# Patient Record
Sex: Female | Born: 1955 | Race: White | Hispanic: No | Marital: Married | Smoking: Current every day smoker
Health system: Southern US, Community
[De-identification: ages and names within clinical notes are randomized; demographics above are authoritative.]

## PROBLEM LIST (undated history)

## (undated) DIAGNOSIS — J449 Chronic obstructive pulmonary disease, unspecified: Secondary | ICD-10-CM

## (undated) DIAGNOSIS — E7800 Pure hypercholesterolemia, unspecified: Secondary | ICD-10-CM

## (undated) DIAGNOSIS — I10 Essential (primary) hypertension: Secondary | ICD-10-CM

## (undated) DIAGNOSIS — J441 Chronic obstructive pulmonary disease with (acute) exacerbation: Secondary | ICD-10-CM

## (undated) HISTORY — DX: Chronic obstructive pulmonary disease with (acute) exacerbation: J44.1

## (undated) HISTORY — PX: APPENDECTOMY: SHX54

## (undated) HISTORY — DX: Essential (primary) hypertension: I10

---

## 1997-12-10 ENCOUNTER — Other Ambulatory Visit: Admission: RE | Admit: 1997-12-10 | Discharge: 1997-12-10 | Payer: Self-pay | Admitting: Obstetrics and Gynecology

## 2003-04-08 ENCOUNTER — Other Ambulatory Visit: Admission: RE | Admit: 2003-04-08 | Discharge: 2003-04-08 | Payer: Self-pay | Admitting: Obstetrics and Gynecology

## 2006-07-31 ENCOUNTER — Emergency Department (HOSPITAL_COMMUNITY): Admission: EM | Admit: 2006-07-31 | Discharge: 2006-07-31 | Payer: Self-pay | Admitting: Emergency Medicine

## 2008-06-09 ENCOUNTER — Emergency Department (HOSPITAL_COMMUNITY): Admission: EM | Admit: 2008-06-09 | Discharge: 2008-06-09 | Payer: Self-pay | Admitting: Emergency Medicine

## 2009-09-10 ENCOUNTER — Encounter: Admission: RE | Admit: 2009-09-10 | Discharge: 2009-09-10 | Payer: Self-pay | Admitting: Family Medicine

## 2010-04-12 LAB — DIFFERENTIAL
Basophils Absolute: 0 10*3/uL (ref 0.0–0.1)
Basophils Relative: 0 % (ref 0–1)
Eosinophils Absolute: 0 10*3/uL (ref 0.0–0.7)
Monocytes Absolute: 0.6 10*3/uL (ref 0.1–1.0)
Monocytes Relative: 5 % (ref 3–12)
Neutro Abs: 11.1 10*3/uL — ABNORMAL HIGH (ref 1.7–7.7)
Neutrophils Relative %: 89 % — ABNORMAL HIGH (ref 43–77)

## 2010-04-12 LAB — BASIC METABOLIC PANEL
CO2: 27 mEq/L (ref 19–32)
Calcium: 9.4 mg/dL (ref 8.4–10.5)
Chloride: 102 mEq/L (ref 96–112)
Creatinine, Ser: 0.6 mg/dL (ref 0.4–1.2)
Glucose, Bld: 104 mg/dL — ABNORMAL HIGH (ref 70–99)

## 2010-04-12 LAB — CBC
MCHC: 34.1 g/dL (ref 30.0–36.0)
MCV: 93 fL (ref 78.0–100.0)
RDW: 12.7 % (ref 11.5–15.5)

## 2010-04-12 LAB — POCT CARDIAC MARKERS
CKMB, poc: <1 ng/mL — ABNORMAL LOW (ref 1.0–8.0)
Myoglobin, poc: 52.1 ng/mL (ref 12–200)
Troponin i, poc: <0.05 ng/mL (ref 0.00–0.09)

## 2010-09-30 ENCOUNTER — Other Ambulatory Visit: Payer: Self-pay | Admitting: Family Medicine

## 2010-09-30 DIAGNOSIS — Z1231 Encounter for screening mammogram for malignant neoplasm of breast: Secondary | ICD-10-CM

## 2010-10-13 ENCOUNTER — Ambulatory Visit
Admission: RE | Admit: 2010-10-13 | Discharge: 2010-10-13 | Disposition: A | Discharge status: Home / Self Care | Payer: BC Managed Care – PPO | Source: Ambulatory Visit | Attending: Family Medicine | Admitting: Family Medicine

## 2010-10-13 DIAGNOSIS — Z1231 Encounter for screening mammogram for malignant neoplasm of breast: Secondary | ICD-10-CM

## 2010-10-18 LAB — CBC
HCT: 46.4 — ABNORMAL HIGH
Hemoglobin: 15.9 — ABNORMAL HIGH
MCHC: 34.3
MCV: 91.4
RBC: 5.08
RDW: 13.3

## 2010-10-18 LAB — BASIC METABOLIC PANEL
CO2: 28
Calcium: 9.6
Chloride: 102
GFR calc Af Amer: >60
Glucose, Bld: 97
Sodium: 140

## 2010-10-18 LAB — DIFFERENTIAL
Basophils Absolute: 0
Basophils Relative: 0
Eosinophils Absolute: 0.1
Eosinophils Relative: 1
Monocytes Absolute: 0.6
Monocytes Relative: 6

## 2010-10-18 LAB — POCT CARDIAC MARKERS: Myoglobin, poc: 63.5

## 2011-07-04 ENCOUNTER — Telehealth: Payer: Self-pay | Admitting: *Deleted

## 2011-07-06 NOTE — Telephone Encounter (Signed)
error 

## 2012-05-25 ENCOUNTER — Observation Stay (HOSPITAL_COMMUNITY)
Admission: EM | Admit: 2012-05-25 | Discharge: 2012-05-26 | Disposition: A | Discharge status: Home / Self Care | Payer: Medicaid Other | Attending: Internal Medicine | Admitting: Internal Medicine

## 2012-05-25 ENCOUNTER — Emergency Department (HOSPITAL_COMMUNITY): Payer: Self-pay

## 2012-05-25 ENCOUNTER — Encounter (HOSPITAL_COMMUNITY): Payer: Self-pay | Admitting: Cardiology

## 2012-05-25 DIAGNOSIS — E7800 Pure hypercholesterolemia, unspecified: Secondary | ICD-10-CM | POA: Insufficient documentation

## 2012-05-25 DIAGNOSIS — J441 Chronic obstructive pulmonary disease with (acute) exacerbation: Secondary | ICD-10-CM

## 2012-05-25 DIAGNOSIS — F17200 Nicotine dependence, unspecified, uncomplicated: Secondary | ICD-10-CM | POA: Insufficient documentation

## 2012-05-25 DIAGNOSIS — J9611 Chronic respiratory failure with hypoxia: Secondary | ICD-10-CM | POA: Diagnosis present

## 2012-05-25 DIAGNOSIS — R0902 Hypoxemia: Secondary | ICD-10-CM

## 2012-05-25 DIAGNOSIS — Z720 Tobacco use: Secondary | ICD-10-CM | POA: Diagnosis present

## 2012-05-25 DIAGNOSIS — R0609 Other forms of dyspnea: Secondary | ICD-10-CM

## 2012-05-25 HISTORY — DX: Chronic obstructive pulmonary disease with (acute) exacerbation: J44.1

## 2012-05-25 HISTORY — DX: Chronic obstructive pulmonary disease, unspecified: J44.9

## 2012-05-25 HISTORY — DX: Pure hypercholesterolemia, unspecified: E78.00

## 2012-05-25 LAB — BASIC METABOLIC PANEL
CO2: 28 mEq/L (ref 19–32)
Chloride: 97 mEq/L (ref 96–112)
Creatinine, Ser: 0.57 mg/dL (ref 0.50–1.10)
Glucose, Bld: 97 mg/dL (ref 70–99)

## 2012-05-25 LAB — CBC
HCT: 45.4 % (ref 36.0–46.0)
Hemoglobin: 15.6 g/dL — ABNORMAL HIGH (ref 12.0–15.0)
MCH: 32.4 pg (ref 26.0–34.0)
MCV: 94.2 fL (ref 78.0–100.0)
Platelets: 355 10*3/uL (ref 150–400)
RBC: 4.82 MIL/uL (ref 3.87–5.11)
WBC: 11 10*3/uL — ABNORMAL HIGH (ref 4.0–10.5)

## 2012-05-25 LAB — POCT I-STAT TROPONIN I: Troponin i, poc: 0 ng/mL (ref 0.00–0.08)

## 2012-05-25 LAB — PRO B NATRIURETIC PEPTIDE: Pro B Natriuretic peptide (BNP): 97.4 pg/mL (ref 0–125)

## 2012-05-25 MED ORDER — ALBUTEROL SULFATE HFA 108 (90 BASE) MCG/ACT IN AERS
2.0000 | INHALATION_SPRAY | Freq: Four times a day (QID) | RESPIRATORY_TRACT | Status: DC
  Administered 2012-05-26: 2 via RESPIRATORY_TRACT
  Filled 2012-05-25 (×3): qty 6.7

## 2012-05-25 MED ORDER — METHYLPREDNISOLONE SODIUM SUCC 125 MG IJ SOLR: 80.0000 mg | Freq: Two times a day (BID) | INTRAMUSCULAR | Status: DC

## 2012-05-25 MED ORDER — ALBUTEROL SULFATE (5 MG/ML) 0.5% IN NEBU: 2.5000 mg | INHALATION_SOLUTION | RESPIRATORY_TRACT | Status: DC | PRN

## 2012-05-25 MED ORDER — ENOXAPARIN SODIUM 40 MG/0.4ML ~~LOC~~ SOLN
40.0000 mg | SUBCUTANEOUS | Status: DC
  Administered 2012-05-25: 40 mg via SUBCUTANEOUS
  Filled 2012-05-25 (×2): qty 0.4

## 2012-05-25 MED ORDER — PREDNISONE 20 MG PO TABS
60.0000 mg | ORAL_TABLET | Freq: Once | ORAL | Status: AC
  Administered 2012-05-25: 60 mg via ORAL
  Filled 2012-05-25: qty 3

## 2012-05-25 MED ORDER — SODIUM CHLORIDE 0.9 % IV SOLN: 250.0000 mL | INTRAVENOUS | Status: DC | PRN

## 2012-05-25 MED ORDER — SODIUM CHLORIDE 0.9 % IJ SOLN: 3.0000 mL | INTRAMUSCULAR | Status: DC | PRN

## 2012-05-25 MED ORDER — DOXYCYCLINE HYCLATE 100 MG PO TABS
100.0000 mg | ORAL_TABLET | Freq: Two times a day (BID) | ORAL | Status: DC
  Administered 2012-05-25 – 2012-05-26 (×2): 100 mg via ORAL
  Filled 2012-05-25 (×3): qty 1

## 2012-05-25 MED ORDER — ACETAMINOPHEN 650 MG RE SUPP: 650.0000 mg | Freq: Four times a day (QID) | RECTAL | Status: DC | PRN

## 2012-05-25 MED ORDER — SODIUM CHLORIDE 0.9 % IJ SOLN: 3.0000 mL | Freq: Two times a day (BID) | INTRAMUSCULAR | Status: DC

## 2012-05-25 MED ORDER — ALBUTEROL SULFATE (5 MG/ML) 0.5% IN NEBU
5.0000 mg | INHALATION_SOLUTION | Freq: Once | RESPIRATORY_TRACT | Status: AC
  Administered 2012-05-25: 5 mg via RESPIRATORY_TRACT
  Filled 2012-05-25: qty 1

## 2012-05-25 MED ORDER — ONDANSETRON HCL 4 MG/2ML IJ SOLN: 4.0000 mg | Freq: Three times a day (TID) | INTRAMUSCULAR | Status: AC | PRN

## 2012-05-25 MED ORDER — ASPIRIN 81 MG PO CHEW
81.0000 mg | CHEWABLE_TABLET | Freq: Every day | ORAL | Status: DC
  Administered 2012-05-25 – 2012-05-26 (×2): 81 mg via ORAL
  Filled 2012-05-25 (×2): qty 1

## 2012-05-25 MED ORDER — ACETAMINOPHEN 325 MG PO TABS: 650.0000 mg | ORAL_TABLET | Freq: Four times a day (QID) | ORAL | Status: DC | PRN

## 2012-05-25 MED ORDER — NICOTINE 21 MG/24HR TD PT24
21.0000 mg | MEDICATED_PATCH | Freq: Every day | TRANSDERMAL | Status: DC
  Administered 2012-05-25 – 2012-05-26 (×2): 21 mg via TRANSDERMAL
  Filled 2012-05-25 (×2): qty 1

## 2012-05-25 MED ORDER — IPRATROPIUM BROMIDE 0.02 % IN SOLN
0.5000 mg | Freq: Once | RESPIRATORY_TRACT | Status: AC
  Administered 2012-05-25: 0.5 mg via RESPIRATORY_TRACT
  Filled 2012-05-25: qty 2.5

## 2012-05-25 MED ORDER — PREDNISONE 50 MG PO TABS
50.0000 mg | ORAL_TABLET | Freq: Two times a day (BID) | ORAL | Status: DC
  Administered 2012-05-26: 50 mg via ORAL
  Filled 2012-05-25 (×3): qty 1

## 2012-05-25 MED ORDER — ALBUTEROL SULFATE (5 MG/ML) 0.5% IN NEBU
5.0000 mg | INHALATION_SOLUTION | RESPIRATORY_TRACT | Status: DC
  Administered 2012-05-25: 5 mg via RESPIRATORY_TRACT
  Filled 2012-05-25: qty 1

## 2012-05-25 NOTE — ED Notes (Signed)
Pt reports over the past 7-8 she has had some SOB and increased work of breathing. States that she has also been having some palpitations at night when she lays down. Also reports some chest tightness.

## 2012-05-25 NOTE — ED Notes (Signed)
Pt to XR

## 2012-05-25 NOTE — H&P (Signed)
[facility] History and Physical  Judith Scott ZOX:[id number] DOB: 07-Jan-1955 DOA: 05/25/2012  Referring physician:ED PCP: No PCP Per Patient  Specialists: none  Chief Complaint:  Chief Complaint  Patient presents with  . Shortness of Breath     HPI: Judith Scott is a 57 y.o. female with past medical history of COPD who presented to the emergency room with one week of worsening dyspnea cough with yellow sputum production. She has tried to use her nebulized albuterol and pain without relief. Denies any fevers or chills. Still smokes cigarettes. In the emergency room she received bronchodilators and prednisone but upon trial of ambulation she became tachypneic and hypoxic she was referred for admission.    Review of Systems: The patient denies anorexia, fever, weight loss,, vision loss, decreased hearing, hoarseness, chest pain, syncope, peripheral edema, balance deficits, hemoptysis, abdominal pain, melena, hematochezia, severe indigestion/heartburn, hematuria, incontinence, genital sores, muscle weakness, suspicious skin lesions, transient blindness, difficulty walking, depression, unusual weight change, abnormal bleeding, enlarged lymph nodes,   Past Medical History  Diagnosis Date  . COPD (chronic obstructive pulmonary disease)   . Hypercholesteremia    History reviewed. No pertinent past surgical history. Social History:  reports that she has been smoking Cigarettes.  She has a 40 pack-year smoking history. She does not have any smokeless tobacco history on file. She reports that she does not drink alcohol or use illicit drugs.  No Known Allergies  Family History  Problem Relation Age of Onset  . Cancer Mother   . Heart failure Father      Prior to Admission medications   Medication Sig Start Date End Date Taking? Authorizing Provider  albuterol (PROVENTIL) (5 MG/ML) 0.5% nebulizer solution Take 2.5 mg by nebulization daily as needed for wheezing or shortness of breath.    Yes Historical Provider, MD  aspirin (ASPIRIN ADULT LOW STRENGTH) 81 MG chewable tablet Chew 81 mg by mouth daily.   Yes Historical Provider, MD  Aspirin-Acetaminophen (GOODYS BODY PAIN PO) Take 1 packet by mouth 3 (three) times daily as needed (pain).   Yes Historical Provider, MD   Physical Exam: Filed Vitals:   05/25/12 1218 05/25/12 1225 05/25/12 1526 05/25/12 1629  BP:   132/57   Pulse:   77   Temp:      TempSrc:      Resp:   20   SpO2: 93% 92% 90% 88%     General:  Alert and oriented x3  Eyes: Pupils equal round react to light accommodation, extraocular movements intact  ENT: Clear pharynx no exudates  Neck: No jugular venous distention  Cardiovascular: Regular rate and rhythm without murmurs rubs or gallops  Respiratory: Fairly good air movement, few rhonchi, minimal wheezes  Abdomen: Soft nontender bowel sounds are present  Skin: Warm dry without rashes  Musculoskeletal: Intact  Psychiatric: Euthymic  Neurologic: Cranial nerves 2-12 intact, strength 5 out of 5 in all 4 extremities, sensation intact  Labs on Admission:  Basic Metabolic Panel:  Recent Labs Lab 05/25/12 1201  NA 138  K 4.1  CL 97  CO2 28  GLUCOSE 97  BUN 8  CREATININE 0.57  CALCIUM 9.6   Liver Function Tests: No results found for this basename: AST, ALT, ALKPHOS, BILITOT, PROT, ALBUMIN,  in the last 168 hours No results found for this basename: LIPASE, AMYLASE,  in the last 168 hours No results found for this basename: AMMONIA,  in the last 168 hours CBC:  Recent Labs Lab 05/25/12 1201  WBC  11.0*  HGB 15.6*  HCT 45.4  MCV 94.2  PLT 355   Cardiac Enzymes: No results found for this basename: CKTOTAL, CKMB, CKMBINDEX, TROPONINI,  in the last 168 hours  BNP (last 3 results)  Recent Labs  05/25/12 1200  PROBNP 97.4   CBG: No results found for this basename: GLUCAP,  in the last 168 hours  Radiological Exams on Admission: Dg Chest 2 View  05/25/2012   *RADIOLOGY  REPORT*  Clinical Data: Short of breath, anxiety  CHEST - 2 VIEW  Comparison: Prior chest x-rays 06/09/2008  Findings: Pulmonary hyperexpansion with flattening of the diaphragms and central airway thickening as can be seen with chronic bronchitis/COPD.  Prominence of the anterior aspect of the right inferior ribs is similar to prior.  No pneumothorax, pleural effusion, focal airspace consolidation or suspicious pulmonary nodule.  Cardiac and mediastinal contours are within normal limits. No acute osseous abnormality.  IMPRESSION: No acute cardiopulmonary disease.  COPD.   Original Report Authenticated By: Malachy Moan, M.D.      Assessment/Plan Principal Problem:   COPD exacerbation Active Problems:   Tobacco abuse   Hypoxia   1. COPD exacerbation-mild-associated with hypoxemia at ambulation. We'll place on observation in the hospital and start IV steroids, antibiotics, albuterol MDI. 2. Tobacco abuse I have counseled on importance of smoking cessation and placed patient on nicotine patch 3. Hypoxia from COPD exacerbation-probably patient has a significant degree of emphysema-she'll need reassessment of her oxygen needs prior to discharge    Code Status:  Full code (must indicate code status--if unknown or must be presumed, indicate so) Family Communication: Patient (indicate person spoken with, if applicable, with phone number if by telephone) Disposition Plan: Home (indicate anticipated LOS)    Matix Henshaw [facility] Pager [phone]  If 7PM-7AM, please contact night-coverage [email or web address] Password [facility] 05/25/2012, 5:52 PM

## 2012-05-25 NOTE — ED Notes (Signed)
Walked pt around nurses station O2 between 86%-89%. Pt said she did not feel any more short of breath then normal

## 2012-05-25 NOTE — ED Provider Notes (Signed)
History     CSN: [id number]  Arrival date & time 05/25/12  1146   First MD Initiated Contact with Patient 05/25/12 1201      Chief Complaint  Patient presents with  . Shortness of Breath    (Consider location/radiation/quality/duration/timing/severity/associated sxs/prior treatment) HPI Comments: Patient with history of COPD presents complaining of increasing dyspnea with exertion and shortness of breath, worse at night, for the past week. Patient also admits to palpitations at night. She has had these in the past. She has not had chest pain. She has had chest tightness with shortness of breath that resolved with albuterol. Patient used her last albuterol inhaler early this morning. She denies fever, upper respiratory tract infection symptoms. She has had a productive cough. No abdominal pain, urinary symptoms. No orthopnea or lower extremity edema. No history of blood clots, recent travel. No other treatments prior to arrival. Patient has been off of all of her chronic medications since she lost her insurance year ago.  Patient is a 57 y.o. female presenting with shortness of breath. The history is provided by the patient.  Shortness of Breath Associated symptoms: cough   Associated symptoms: no abdominal pain, no chest pain, no fever, no headaches, no rash, no sore throat and no vomiting     Past Medical History  Diagnosis Date  . COPD (chronic obstructive pulmonary disease)   . Hypercholesteremia     History reviewed. No pertinent past surgical history.  History reviewed. No pertinent family history.  History  Substance Use Topics  . Smoking status: Current Every Day Smoker    Types: Cigarettes  . Smokeless tobacco: Not on file  . Alcohol Use: No    OB History   Grav Para Term Preterm Abortions TAB SAB Ect Mult Living                  Review of Systems  Constitutional: Negative for fever.  HENT: Negative for sore throat and rhinorrhea.   Eyes: Negative for  redness.  Respiratory: Positive for cough, chest tightness and shortness of breath.   Cardiovascular: Negative for chest pain and leg swelling.  Gastrointestinal: Negative for nausea, vomiting, abdominal pain and diarrhea.  Genitourinary: Negative for dysuria.  Musculoskeletal: Negative for myalgias.  Skin: Negative for rash.  Neurological: Negative for headaches.    Allergies  Review of patient's allergies indicates no known allergies.  Home Medications   Current Outpatient Rx  Name  Route  Sig  Dispense  Refill  . albuterol (PROVENTIL) (5 MG/ML) 0.5% nebulizer solution   Nebulization   Take 2.5 mg by nebulization daily as needed for wheezing or shortness of breath.         Marland Kitchen aspirin (ASPIRIN ADULT LOW STRENGTH) 81 MG chewable tablet   Oral   Chew 81 mg by mouth daily.         . Aspirin-Acetaminophen (GOODYS BODY PAIN PO)   Oral   Take 1 packet by mouth 3 (three) times daily as needed (pain).           BP 129/69  Pulse 82  Temp(Src) 98.6 F (37 C) (Oral)  Resp 18  SpO2 94%  Physical Exam  Nursing note and vitals reviewed. Constitutional: She appears well-developed and well-nourished.  HENT:  Head: Normocephalic and atraumatic.  Eyes: Conjunctivae are normal. Right eye exhibits no discharge. Left eye exhibits no discharge.  Neck: Normal range of motion. Neck supple. No JVD present.  Cardiovascular: Normal rate, regular rhythm and normal heart  sounds.   Pulmonary/Chest: Effort normal and breath sounds normal. No respiratory distress. She has no wheezes. She has no rales.  Abdominal: Soft. There is no tenderness.  Musculoskeletal: She exhibits no edema and no tenderness.  Neurological: She is alert.  Skin: Skin is warm and dry.  Psychiatric: She has a normal mood and affect.    ED Course  Procedures (including critical care time)  Labs Reviewed  CBC - Abnormal; Notable for the following:    WBC 11.0 (*)    Hemoglobin 15.6 (*)    All other components  within normal limits  BASIC METABOLIC PANEL  PRO B NATRIURETIC PEPTIDE  POCT I-STAT TROPONIN I   Dg Chest 2 View  05/25/2012   *RADIOLOGY REPORT*  Clinical Data: Short of breath, anxiety  CHEST - 2 VIEW  Comparison: Prior chest x-rays 06/09/2008  Findings: Pulmonary hyperexpansion with flattening of the diaphragms and central airway thickening as can be seen with chronic bronchitis/COPD.  Prominence of the anterior aspect of the right inferior ribs is similar to prior.  No pneumothorax, pleural effusion, focal airspace consolidation or suspicious pulmonary nodule.  Cardiac and mediastinal contours are within normal limits. No acute osseous abnormality.  IMPRESSION: No acute cardiopulmonary disease.  COPD.   Original Report Authenticated By: Malachy Moan, M.D.     1. COPD exacerbation   2. Dyspnea on exertion     12:15 PM Patient seen and examined. Work-up initiated. Medications ordered.   Vital signs reviewed and are as follows: Filed Vitals:   05/25/12 1153  BP: 129/69  Pulse: 82  Temp: 98.6 F (37 C)  Resp: 18   Work-up complete and patient states subjective improvement after neb treatment.   However, upon ambulation, she becomes short of breath and hypoxic between 86-89% on room air.   Patient given option of admission for treatment or sign-out AMA with albuterol -- she agrees to be admitted for treatment.   5:28 PM Spoke with Dr. Lavera Guise of [facility] who will see.    MDM  COPD exacerbation, hypoxia. Admit.         Renne Crigler, PA-C 05/25/12 1728

## 2012-05-25 NOTE — ED Notes (Signed)
Per admitting MD pt does not need IV access.

## 2012-05-25 NOTE — Progress Notes (Signed)
EDCM asked to see patient by [facility] representative Felicia, regarding medication assistance.  As per patient, she had medical insurance a year ago, but no longer has insurance and is now having difficulty affording medications.  Patient entered into [facility] program by CM.  Explained to patient that she will have a three dollar copay for each one of her prescriptions, to get her prescriptions filled in seven days, and that she would not be eligible for this service again until May of 2015.  Patient verbalizes understanding and is grateful for the assistance.

## 2012-05-26 DIAGNOSIS — F17200 Nicotine dependence, unspecified, uncomplicated: Secondary | ICD-10-CM

## 2012-05-26 DIAGNOSIS — J441 Chronic obstructive pulmonary disease with (acute) exacerbation: Principal | ICD-10-CM

## 2012-05-26 DIAGNOSIS — R0902 Hypoxemia: Secondary | ICD-10-CM

## 2012-05-26 MED ORDER — ALBUTEROL SULFATE HFA 108 (90 BASE) MCG/ACT IN AERS: 2.0000 | INHALATION_SPRAY | RESPIRATORY_TRACT | Status: DC | PRN

## 2012-05-26 MED ORDER — DOXYCYCLINE HYCLATE 100 MG PO TABS: 100.0000 mg | ORAL_TABLET | Freq: Two times a day (BID) | ORAL | Status: DC

## 2012-05-26 MED ORDER — PREDNISONE 10 MG PO TABS: ORAL_TABLET | ORAL | Status: DC

## 2012-05-26 MED ORDER — TIOTROPIUM BROMIDE MONOHYDRATE 18 MCG IN CAPS
18.0000 ug | ORAL_CAPSULE | Freq: Every day | RESPIRATORY_TRACT | Status: DC
  Filled 2012-05-26: qty 5

## 2012-05-26 MED ORDER — ALBUTEROL SULFATE (5 MG/ML) 0.5% IN NEBU: 2.5000 mg | INHALATION_SOLUTION | Freq: Every day | RESPIRATORY_TRACT | Status: DC | PRN

## 2012-05-26 MED ORDER — TIOTROPIUM BROMIDE MONOHYDRATE 18 MCG IN CAPS: 18.0000 ug | ORAL_CAPSULE | Freq: Every day | RESPIRATORY_TRACT | Status: DC

## 2012-05-26 NOTE — Progress Notes (Signed)
Pt d/c to car via wheelchair.  Left with oxygen.  Denied any other needs at this time.

## 2012-05-26 NOTE — Progress Notes (Signed)
Reviewed discharge instructions and prescriptions with patient.  Pt denied any other needs at this time.  Upon checking oxygen saturations, after activity pt at 88%.  When at rest, patient 95%.  Denied any SOB.  Dr. Jerral Ralph informed.  Will come to assess pt.

## 2012-05-26 NOTE — Discharge Summary (Addendum)
PATIENT DETAILS Name: Judith Scott Age: 57 y.o. Sex: female Date of Birth: [birth year] MRN: [id number]. Admit Date: 05/25/2012 Admitting Physician: Lorane Gell, MD PCP:No PCP Per Patient  Recommendations for Outpatient Follow-up:  1. Counsel against Tobacco use 2. Optimize COPD medications  PRIMARY DISCHARGE DIAGNOSIS:  Principal Problem:   COPD exacerbation Active Problems:   Tobacco abuse   Hypoxia      PAST MEDICAL HISTORY: Past Medical History  Diagnosis Date  . COPD (chronic obstructive pulmonary disease)   . Hypercholesteremia     DISCHARGE MEDICATIONS:   Medication List    STOP taking these medications       GOODYS BODY PAIN PO      TAKE these medications       albuterol 108 (90 BASE) MCG/ACT inhaler  Commonly known as:  PROVENTIL HFA;VENTOLIN HFA  Inhale 2 puffs into the lungs every 4 (four) hours as needed for wheezing.     albuterol (5 MG/ML) 0.5% nebulizer solution  Commonly known as:  PROVENTIL  Take 0.5 mLs (2.5 mg total) by nebulization daily as needed for wheezing or shortness of breath.     ASPIRIN ADULT LOW STRENGTH 81 MG chewable tablet  Generic drug:  aspirin  Chew 81 mg by mouth daily.     doxycycline 100 MG tablet  Commonly known as:  VIBRA-TABS  Take 1 tablet (100 mg total) by mouth every 12 (twelve) hours.     predniSONE 10 MG tablet  Commonly known as:  DELTASONE  Take 4 tablets for 2 days, then,  Take 3 tablets for 2 days, then,  Take 2 tablets for 2 days, then,   Take 1 tablet for 1 day and then stop     tiotropium 18 MCG inhalation capsule  Commonly known as:  SPIRIVA  Place 1 capsule (18 mcg total) into inhaler and inhale daily.        ALLERGIES:  No Known Allergies  BRIEF HPI:  See H&P, Labs, Consult and Test reports for all details in brief, Judith Scott is a 57 y.o. female with past medical history of COPD who presented to the emergency room with one week of worsening dyspnea cough with yellow sputum production.  She has tried to use her nebulized albuterol and pain without relief. Denies any fevers or chills. Still smokes cigarettes. In the emergency room she received bronchodilators and prednisone but upon trial of ambulation she became tachypneic and hypoxic she was referred for admission.   CONSULTATIONS:   None  PERTINENT RADIOLOGIC STUDIES: Dg Chest 2 View  05/25/2012   *RADIOLOGY REPORT*  Clinical Data: Short of breath, anxiety  CHEST - 2 VIEW  Comparison: Prior chest x-rays 06/09/2008  Findings: Pulmonary hyperexpansion with flattening of the diaphragms and central airway thickening as can be seen with chronic bronchitis/COPD.  Prominence of the anterior aspect of the right inferior ribs is similar to prior.  No pneumothorax, pleural effusion, focal airspace consolidation or suspicious pulmonary nodule.  Cardiac and mediastinal contours are within normal limits. No acute osseous abnormality.  IMPRESSION: No acute cardiopulmonary disease.  COPD.   Original Report Authenticated By: Malachy Moan, M.D.     PERTINENT LAB RESULTS: CBC:  Recent Labs  05/25/12 1201  WBC 11.0*  HGB 15.6*  HCT 45.4  PLT 355   CMET CMP     Component Value Date/Time   NA 138 05/25/2012 1201   K 4.1 05/25/2012 1201   CL 97 05/25/2012 1201   CO2 28 05/25/2012 1201  GLUCOSE 97 05/25/2012 1201   BUN 8 05/25/2012 1201   CREATININE 0.57 05/25/2012 1201   CALCIUM 9.6 05/25/2012 1201   GFRNONAA >90 05/25/2012 1201   GFRAA >90 05/25/2012 1201    GFR Estimated Creatinine Clearance: 65.4 ml/min (by C-G formula based on Cr of 0.57). No results found for this basename: LIPASE, AMYLASE,  in the last 72 hours No results found for this basename: CKTOTAL, CKMB, CKMBINDEX, TROPONINI,  in the last 72 hours No components found with this basename: POCBNP,  No results found for this basename: DDIMER,  in the last 72 hours No results found for this basename: HGBA1C,  in the last 72 hours No results found for this basename: CHOL,  HDL, LDLCALC, TRIG, CHOLHDL, LDLDIRECT,  in the last 72 hours No results found for this basename: TSH, T4TOTAL, FREET3, T3FREE, THYROIDAB,  in the last 72 hours No results found for this basename: VITAMINB12, FOLATE, FERRITIN, TIBC, IRON, RETICCTPCT,  in the last 72 hours Coags: No results found for this basename: PT, INR,  in the last 72 hours Microbiology: No results found for this or any previous visit (from the past 240 hour(s)).   BRIEF HOSPITAL COURSE:   Principal Problem:   COPD exacerbation -patient was admitted, given steroids, nebulized bronchodilators and doxycycline. With this she made rapid improvement, her hypoxemia has resolved, she has ambulated in the hallway without any difficulty, she is still hypoxic slightly on ambulation,she likely will require home O2. She claims she is back to her usual baseline. She is being discharged home in a stable condition, with the above noted medications. I have asked her to call the outpatient clinic for a hospital follow up appt in 1 week.  Active Problems: Tobacco abuse:  I have counseled on importance of smoking cessation  Hypoxia from COPD exacerbation -probably patient has a significant degree of emphysema-resolvd  TODAY-DAY OF DISCHARGE:  Subjective:   Judith Scott today has no headache,no chest abdominal pain,no new weakness tingling or numbness, feels much better wants to go home today.   Objective:   Blood pressure 146/78, pulse 88, temperature 97.8 F (36.6 C), temperature source Oral, resp. rate 20, height 5\' 4"  (1.626 m), weight 62.7 kg (138 lb 3.7 oz), SpO2 87.00%.  Intake/Output Summary (Last 24 hours) at 05/26/12 1214 Last data filed at 05/26/12 0900  Gross per 24 hour  Intake    240 ml  Output      0 ml  Net    240 ml   Filed Weights   05/25/12 1943  Weight: 62.7 kg (138 lb 3.7 oz)    Exam Awake Alert, Oriented *3, No new F.N deficits, Normal affect [place].AT,PERRAL Supple Neck,No JVD, No cervical  lymphadenopathy appriciated.  Symmetrical Chest wall movement, Good air movement bilaterally, CTAB RRR,No Gallops,Rubs or new Murmurs, No Parasternal Heave +ve B.Sounds, Abd Soft, Non tender, No organomegaly appriciated, No rebound -guarding or rigidity. No Cyanosis, Clubbing or edema, No new Rash or bruise  DISCHARGE CONDITION: Stable  DISPOSITION: Home   DISCHARGE INSTRUCTIONS:    Activity:  As tolerated  Diet recommendation: Regular Diet       Discharge Orders   Future Orders Complete By Expires     Call MD for:  difficulty breathing, headache or visual disturbances  As directed     Diet - low sodium heart healthy  As directed     Increase activity slowly  As directed        Follow-up Information   Follow up  with [facility]    . Schedule an appointment as soon as possible for a visit in 1 week.   Contact information:   [address] [place] [address] Tel-[phone]      Total Time spent on discharge equals 45 minutes.  SignedJeoffrey Massed 05/26/2012 12:14 PM

## 2012-05-26 NOTE — Progress Notes (Signed)
  Pt admitted to the unit. Pt is stable, alert and oriented per baseline. Oriented to room, staff, and call bell. Educated to call for any assistance. Bed in lowest position, call bell within reach- will continue to monitor. 

## 2012-05-26 NOTE — Progress Notes (Signed)
Pt ambulated in hall way with oxygen at 2L via [place].  Oxygen saturations maintained between 91-93%.

## 2012-05-28 NOTE — Progress Notes (Signed)
Utilization review completed.  P.J. Milen Lengacher,RN,BSN Case Manager [phone]  

## 2012-05-28 NOTE — ED Provider Notes (Signed)
Medical screening examination/treatment/procedure(s) were performed by non-physician practitioner and as supervising physician I was immediately available for consultation/collaboration.   Charles B. Sheldon, MD 05/28/12 1302 

## 2012-06-06 ENCOUNTER — Ambulatory Visit: Payer: BC Managed Care – PPO | Attending: Family Medicine | Admitting: Internal Medicine

## 2012-06-06 VITALS — BP 131/76 | HR 81 | Temp 98.4°F | Resp 15 | Wt 137.0 lb

## 2012-06-06 DIAGNOSIS — J4489 Other specified chronic obstructive pulmonary disease: Secondary | ICD-10-CM | POA: Insufficient documentation

## 2012-06-06 DIAGNOSIS — J441 Chronic obstructive pulmonary disease with (acute) exacerbation: Secondary | ICD-10-CM

## 2012-06-06 DIAGNOSIS — J449 Chronic obstructive pulmonary disease, unspecified: Secondary | ICD-10-CM | POA: Insufficient documentation

## 2012-06-06 MED ORDER — TIOTROPIUM BROMIDE MONOHYDRATE 18 MCG IN CAPS: 18.0000 ug | ORAL_CAPSULE | Freq: Every day | RESPIRATORY_TRACT | Status: DC

## 2012-06-06 MED ORDER — ALBUTEROL SULFATE HFA 108 (90 BASE) MCG/ACT IN AERS: 2.0000 | INHALATION_SPRAY | RESPIRATORY_TRACT | Status: DC | PRN

## 2012-06-06 MED ORDER — VARENICLINE TARTRATE 0.5 MG PO TABS: 0.5000 mg | ORAL_TABLET | Freq: Two times a day (BID) | ORAL | Status: DC

## 2012-06-06 NOTE — Progress Notes (Signed)
Patient ID: Judith Scott, female   DOB: 12/05/1955, 57 y.o.   MRN: [id number]   CC: hospital follow up  HPI: Pt is 57 yo female with COPD, recently hospitalized for COPD flare, now doing better, completed course of ABX and steroids, denies shortness of breath or chest pain, no wheezing, no fever, no chills. Wants to quit smoking.   No Known Allergies Past Medical History  Diagnosis Date  . COPD (chronic obstructive pulmonary disease)   . Hypercholesteremia    Current Outpatient Prescriptions on File Prior to Visit  Medication Sig Dispense Refill  . albuterol (PROVENTIL) (5 MG/ML) 0.5% nebulizer solution Take 0.5 mLs (2.5 mg total) by nebulization daily as needed for wheezing or shortness of breath.  20 mL  2  . aspirin (ASPIRIN ADULT LOW STRENGTH) 81 MG chewable tablet Chew 81 mg by mouth daily.       No current facility-administered medications on file prior to visit.   Family History  Problem Relation Age of Onset  . Cancer Mother   . Heart failure Father    History   Social History  . Marital Status: Married    Spouse Name: N/A    Number of Children: N/A  . Years of Education: N/A   Occupational History  . Not on file.   Social History Main Topics  . Smoking status: Current Every Day Smoker -- 1.00 packs/day for 40 years    Types: Cigarettes  . Smokeless tobacco: Not on file  . Alcohol Use: No  . Drug Use: No  . Sexually Active: Not on file   Other Topics Concern  . Not on file   Social History Narrative  . No narrative on file    Review of Systems  Constitutional: Negative for fever, chills, diaphoresis, activity change, appetite change and fatigue.  HENT: Negative for ear pain, nosebleeds, congestion, facial swelling, rhinorrhea, neck pain, neck stiffness and ear discharge.   Eyes: Negative for pain, discharge, redness, itching and visual disturbance.  Respiratory: Negative for cough, choking, chest tightness, shortness of breath, wheezing and stridor.    Cardiovascular: Negative for chest pain, palpitations and leg swelling.  Gastrointestinal: Negative for abdominal distention.  Genitourinary: Negative for dysuria, urgency, frequency, hematuria, flank pain, decreased urine volume, difficulty urinating and dyspareunia.  Musculoskeletal: Negative for back pain, joint swelling, arthralgias and gait problem.  Neurological: Negative for dizziness, tremors, seizures, syncope, facial asymmetry, speech difficulty, weakness, light-headedness, numbness and headaches.  Hematological: Negative for adenopathy. Does not bruise/bleed easily.  Psychiatric/Behavioral: Negative for hallucinations, behavioral problems, confusion, dysphoric mood, decreased concentration and agitation.    Objective:   Filed Vitals:   06/06/12 1204  BP: 131/76  Pulse: 81  Temp: 98.4 F (36.9 C)  Resp: 15    Physical Exam  Constitutional: Appears well-developed and well-nourished. No distress.  HENT: Normocephalic. External right and left ear normal. Oropharynx is clear and moist.  Eyes: Conjunctivae and EOM are normal. PERRLA, no scleral icterus.  Neck: Normal ROM. Neck supple. No JVD. No tracheal deviation. No thyromegaly.  CVS: RRR, S1/S2 +, no murmurs, no gallops, no carotid bruit.  Pulmonary: Effort and breath sounds normal, no stridor, rhonchi, wheezes, rales.  Abdominal: Soft. BS +,  no distension, tenderness, rebound or guarding.  Musculoskeletal: Normal range of motion. No edema and no tenderness.  Lymphadenopathy: No lymphadenopathy noted, cervical, inguinal. Neuro: Alert. Normal reflexes, muscle tone coordination. No cranial nerve deficit. Skin: Skin is warm and dry. No rash noted. Not diaphoretic. No erythema. No  pallor.  Psychiatric: Normal mood and affect. Behavior, judgment, thought content normal.   Lab Results  Component Value Date   WBC 11.0* 05/25/2012   HGB 15.6* 05/25/2012   HCT 45.4 05/25/2012   MCV 94.2 05/25/2012   PLT 355 05/25/2012   Lab  Results  Component Value Date   CREATININE 0.57 05/25/2012   BUN 8 05/25/2012   NA 138 05/25/2012   K 4.1 05/25/2012   CL 97 05/25/2012   CO2 28 05/25/2012    No results found for this basename: HGBA1C   Lipid Panel  No results found for this basename: chol, trig, hdl, cholhdl, vldl, ldlcalc      Assessment and plan:   Patient Active Problem List   Diagnosis Date Noted  . COPD exacerbation - pt has completed treatment with steroid and ABX, doing well, no wheezing on exam, continue albuterol as needed, discussed cessation at length  05/25/2012  . Tobacco abuse - discussed cessation, pt willing to try chantix, we have discussed side effects, pt willing to try  05/25/2012

## 2012-06-06 NOTE — Patient Instructions (Signed)
Smoking Cessation Quitting smoking is important to your health and has many advantages. However, it is not always easy to quit since nicotine is a very addictive drug. Often times, people try 3 times or more before being able to quit. This document explains the best ways for you to prepare to quit smoking. Quitting takes hard work and a lot of effort, but you can do it. ADVANTAGES OF QUITTING SMOKING  You will live longer, feel better, and live better.  Your body will feel the impact of quitting smoking almost immediately.  Within 20 minutes, blood pressure decreases. Your pulse returns to its normal level.  After 8 hours, carbon monoxide levels in the blood return to normal. Your oxygen level increases.  After 24 hours, the chance of having a heart attack starts to decrease. Your breath, hair, and body stop smelling like smoke.  After 48 hours, damaged nerve endings begin to recover. Your sense of taste and smell improve.  After 72 hours, the body is virtually free of nicotine. Your bronchial tubes relax and breathing becomes easier.  After 2 to 12 weeks, lungs can hold more air. Exercise becomes easier and circulation improves.  The risk of having a heart attack, stroke, cancer, or lung disease is greatly reduced.  After 1 year, the risk of coronary heart disease is cut in half.  After 5 years, the risk of stroke falls to the same as a nonsmoker.  After 10 years, the risk of lung cancer is cut in half and the risk of other cancers decreases significantly.  After 15 years, the risk of coronary heart disease drops, usually to the level of a nonsmoker.  If you are pregnant, quitting smoking will improve your chances of having a healthy baby.  The people you live with, especially any children, will be healthier.  You will have extra money to spend on things other than cigarettes. QUESTIONS TO THINK ABOUT BEFORE ATTEMPTING TO QUIT You may want to talk about your answers with your  caregiver.  Why do you want to quit?  If you tried to quit in the past, what helped and what did not?  What will be the most difficult situations for you after you quit? How will you plan to handle them?  Who can help you through the tough times? Your family? Friends? A caregiver?  What pleasures do you get from smoking? What ways can you still get pleasure if you quit? Here are some questions to ask your caregiver:  How can you help me to be successful at quitting?  What medicine do you think would be best for me and how should I take it?  What should I do if I need more help?  What is smoking withdrawal like? How can I get information on withdrawal? GET READY  Set a quit date.  Change your environment by getting rid of all cigarettes, ashtrays, matches, and lighters in your home, car, or work. Do not let people smoke in your home.  Review your past attempts to quit. Think about what worked and what did not. GET SUPPORT AND ENCOURAGEMENT You have a better chance of being successful if you have help. You can get support in many ways.  Tell your family, friends, and co-workers that you are going to quit and need their support. Ask them not to smoke around you.  Get individual, group, or telephone counseling and support. Programs are available at local hospitals and health centers. Call your local health department for   information about programs in your area.  Spiritual beliefs and practices may help some smokers quit.  Download a "quit meter" on your computer to keep track of quit statistics, such as how long you have gone without smoking, cigarettes not smoked, and money saved.  Get a self-help book about quitting smoking and staying off of tobacco. LEARN NEW SKILLS AND BEHAVIORS  Distract yourself from urges to smoke. Talk to someone, go for a walk, or occupy your time with a task.  Change your normal routine. Take a different route to work. Drink tea instead of coffee.  Eat breakfast in a different place.  Reduce your stress. Take a hot bath, exercise, or read a book.  Plan something enjoyable to do every day. Reward yourself for not smoking.  Explore interactive web-based programs that specialize in helping you quit. GET MEDICINE AND USE IT CORRECTLY Medicines can help you stop smoking and decrease the urge to smoke. Combining medicine with the above behavioral methods and support can greatly increase your chances of successfully quitting smoking.  Nicotine replacement therapy helps deliver nicotine to your body without the negative effects and risks of smoking. Nicotine replacement therapy includes nicotine gum, lozenges, inhalers, nasal sprays, and skin patches. Some may be available over-the-counter and others require a prescription.  Antidepressant medicine helps people abstain from smoking, but how this works is unknown. This medicine is available by prescription.  Nicotinic receptor partial agonist medicine simulates the effect of nicotine in your brain. This medicine is available by prescription. Ask your caregiver for advice about which medicines to use and how to use them based on your health history. Your caregiver will tell you what side effects to look out for if you choose to be on a medicine or therapy. Carefully read the information on the package. Do not use any other product containing nicotine while using a nicotine replacement product.  RELAPSE OR DIFFICULT SITUATIONS Most relapses occur within the first 3 months after quitting. Do not be discouraged if you start smoking again. Remember, most people try several times before finally quitting. You may have symptoms of withdrawal because your body is used to nicotine. You may crave cigarettes, be irritable, feel very hungry, cough often, get headaches, or have difficulty concentrating. The withdrawal symptoms are only temporary. They are strongest when you first quit, but they will go away within  10 14 days. To reduce the chances of relapse, try to:  Avoid drinking alcohol. Drinking lowers your chances of successfully quitting.  Reduce the amount of caffeine you consume. Once you quit smoking, the amount of caffeine in your body increases and can give you symptoms, such as a rapid heartbeat, sweating, and anxiety.  Avoid smokers because they can make you want to smoke.  Do not let weight gain distract you. Many smokers will gain weight when they quit, usually less than 10 pounds. Eat a healthy diet and stay active. You can always lose the weight gained after you quit.  Find ways to improve your mood other than smoking. FOR MORE INFORMATION  www.smokefree.gov  Document Released: 12/14/2000 Document Revised: 06/21/2011 Document Reviewed: 03/31/2011 Exit[employer] Patient Information 2014 [employer].  

## 2012-06-06 NOTE — Progress Notes (Signed)
Patient here for hospital follow up COPD

## 2012-07-10 ENCOUNTER — Emergency Department (HOSPITAL_COMMUNITY): Payer: Medicaid Other

## 2012-07-10 ENCOUNTER — Encounter (HOSPITAL_COMMUNITY): Payer: Self-pay | Admitting: *Deleted

## 2012-07-10 ENCOUNTER — Emergency Department (HOSPITAL_COMMUNITY)
Admission: EM | Admit: 2012-07-10 | Discharge: 2012-07-10 | Disposition: A | Discharge status: Home / Self Care | Payer: Medicaid Other | Attending: Emergency Medicine | Admitting: Emergency Medicine

## 2012-07-10 DIAGNOSIS — Z862 Personal history of diseases of the blood and blood-forming organs and certain disorders involving the immune mechanism: Secondary | ICD-10-CM | POA: Insufficient documentation

## 2012-07-10 DIAGNOSIS — Z8639 Personal history of other endocrine, nutritional and metabolic disease: Secondary | ICD-10-CM | POA: Insufficient documentation

## 2012-07-10 DIAGNOSIS — J4489 Other specified chronic obstructive pulmonary disease: Secondary | ICD-10-CM | POA: Insufficient documentation

## 2012-07-10 DIAGNOSIS — Y929 Unspecified place or not applicable: Secondary | ICD-10-CM | POA: Insufficient documentation

## 2012-07-10 DIAGNOSIS — S161XXA Strain of muscle, fascia and tendon at neck level, initial encounter: Secondary | ICD-10-CM

## 2012-07-10 DIAGNOSIS — X58XXXA Exposure to other specified factors, initial encounter: Secondary | ICD-10-CM | POA: Insufficient documentation

## 2012-07-10 DIAGNOSIS — F17200 Nicotine dependence, unspecified, uncomplicated: Secondary | ICD-10-CM | POA: Insufficient documentation

## 2012-07-10 DIAGNOSIS — S139XXA Sprain of joints and ligaments of unspecified parts of neck, initial encounter: Secondary | ICD-10-CM | POA: Insufficient documentation

## 2012-07-10 DIAGNOSIS — Z7982 Long term (current) use of aspirin: Secondary | ICD-10-CM | POA: Insufficient documentation

## 2012-07-10 DIAGNOSIS — Y939 Activity, unspecified: Secondary | ICD-10-CM | POA: Insufficient documentation

## 2012-07-10 DIAGNOSIS — Z79899 Other long term (current) drug therapy: Secondary | ICD-10-CM | POA: Insufficient documentation

## 2012-07-10 DIAGNOSIS — J449 Chronic obstructive pulmonary disease, unspecified: Secondary | ICD-10-CM | POA: Insufficient documentation

## 2012-07-10 LAB — POCT I-STAT TROPONIN I: Troponin i, poc: 0 ng/mL (ref 0.00–0.08)

## 2012-07-10 LAB — CBC WITH DIFFERENTIAL/PLATELET
Basophils Absolute: 0 10*3/uL (ref 0.0–0.1)
Basophils Relative: 0 % (ref 0–1)
Eosinophils Absolute: 0.2 10*3/uL (ref 0.0–0.7)
Eosinophils Relative: 3 % (ref 0–5)
HCT: 47.3 % — ABNORMAL HIGH (ref 36.0–46.0)
Hemoglobin: 16.1 g/dL — ABNORMAL HIGH (ref 12.0–15.0)
MCH: 31.5 pg (ref 26.0–34.0)
MCHC: 34 g/dL (ref 30.0–36.0)
MCV: 92.6 fL (ref 78.0–100.0)
Monocytes Absolute: 0.7 10*3/uL (ref 0.1–1.0)
Monocytes Relative: 9 % (ref 3–12)
RDW: 13.2 % (ref 11.5–15.5)

## 2012-07-10 LAB — BASIC METABOLIC PANEL
BUN: 8 mg/dL (ref 6–23)
Calcium: 9.4 mg/dL (ref 8.4–10.5)
Chloride: 99 mEq/L (ref 96–112)
Creatinine, Ser: 0.64 mg/dL (ref 0.50–1.10)
GFR calc Af Amer: >90 mL/min (ref >90)
GFR calc non Af Amer: >90 mL/min (ref >90)

## 2012-07-10 MED ORDER — OXYCODONE-ACETAMINOPHEN 5-325 MG PO TABS: 1.0000 | ORAL_TABLET | ORAL | Status: DC | PRN

## 2012-07-10 MED ORDER — CYCLOBENZAPRINE HCL 10 MG PO TABS
10.0000 mg | ORAL_TABLET | Freq: Once | ORAL | Status: AC
  Administered 2012-07-10: 10 mg via ORAL
  Filled 2012-07-10: qty 1

## 2012-07-10 MED ORDER — TRAMADOL HCL 50 MG PO TABS
50.0000 mg | ORAL_TABLET | Freq: Once | ORAL | Status: AC
  Administered 2012-07-10: 50 mg via ORAL
  Filled 2012-07-10: qty 1

## 2012-07-10 MED ORDER — KETOROLAC TROMETHAMINE 60 MG/2ML IM SOLN
60.0000 mg | Freq: Once | INTRAMUSCULAR | Status: AC
  Administered 2012-07-10: 60 mg via INTRAMUSCULAR
  Filled 2012-07-10: qty 2

## 2012-07-10 MED ORDER — DIAZEPAM 5 MG PO TABS
5.0000 mg | ORAL_TABLET | Freq: Once | ORAL | Status: AC
  Administered 2012-07-10: 5 mg via ORAL
  Filled 2012-07-10: qty 1

## 2012-07-10 MED ORDER — METHOCARBAMOL 500 MG PO TABS: 500.0000 mg | ORAL_TABLET | Freq: Two times a day (BID) | ORAL | Status: DC | PRN

## 2012-07-10 NOTE — ED Provider Notes (Signed)
Medical screening examination/treatment/procedure(s) were performed by non-physician practitioner and as supervising physician I was immediately available for consultation/collaboration.   Kishawn Pickar III, MD 07/10/12 2126 

## 2012-07-10 NOTE — ED Notes (Signed)
Pt is here with throbbing/aching pain to left shoulder that started on Friday and it radiates to left neck and left arm.  No pain with movement.

## 2012-07-10 NOTE — ED Provider Notes (Signed)
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  ST/T Wave abnormalities: normal  Conduction Disutrbances:none  Narrative Interpretation: Abnormal EKG  Old EKG Reviewed: unchanged    Carleene Cooper III, MD 07/10/12 1122

## 2012-07-10 NOTE — ED Provider Notes (Signed)
History    CSN: [id number] Arrival date & time 07/10/12  1042  First MD Initiated Contact with Patient 07/10/12 1056     Chief Complaint  Patient presents with  . Shoulder Pain   (Consider location/radiation/quality/duration/timing/severity/associated sxs/prior Treatment) HPI Comments: Patient is a 57 year old female with a past medical history of COPD who presents with a 4 day history of left neck and left shoulder pain. Symptoms started gradually and progressively worsened since the onset. The pain is throbbing and severe with radiation down her left arm. Patient tried Goody powder for symptoms without relief. Patient denies chest pain. Neck movement makes the pain worse. Nothing makes the pain better. No associated symptoms.   Past Medical History  Diagnosis Date  . COPD (chronic obstructive pulmonary disease)   . Hypercholesteremia    History reviewed. No pertinent past surgical history. Family History  Problem Relation Age of Onset  . Cancer Mother   . Heart failure Father    History  Substance Use Topics  . Smoking status: Current Every Day Smoker -- 1.00 packs/day for 40 years    Types: Cigarettes  . Smokeless tobacco: Not on file  . Alcohol Use: No   OB History   Grav Para Term Preterm Abortions TAB SAB Ect Mult Living                 Review of Systems  HENT: Positive for neck pain.   Musculoskeletal: Positive for arthralgias.  All other systems reviewed and are negative.    Allergies  Review of patient's allergies indicates no known allergies.  Home Medications   Current Outpatient Rx  Name  Route  Sig  Dispense  Refill  . albuterol (PROVENTIL HFA;VENTOLIN HFA) 108 (90 BASE) MCG/ACT inhaler   Inhalation   Inhale 2 puffs into the lungs every 4 (four) hours as needed for wheezing.         Marland Kitchen albuterol (PROVENTIL) (5 MG/ML) 0.5% nebulizer solution   Nebulization   Take 2.5 mg by nebulization daily as needed for wheezing or shortness of breath.          Marland Kitchen aspirin 81 MG chewable tablet   Oral   Chew 81 mg by mouth daily.         . Aspirin-Acetaminophen-Caffeine (GOODY HEADACHE PO)   Oral   Take 1 packet by mouth once.         . tiotropium (SPIRIVA) 18 MCG inhalation capsule   Inhalation   Place 18 mcg into inhaler and inhale daily.         . varenicline (CHANTIX) 0.5 MG tablet   Oral   Take 0.5 mg by mouth 2 (two) times daily.          BP 148/79  Pulse 79  Temp(Src) 98.2 F (36.8 C) (Oral)  Resp 20  SpO2 92% Physical Exam  Nursing note and vitals reviewed. Constitutional: She is oriented to person, place, and time. She appears well-developed and well-nourished. No distress.  HENT:  Head: Normocephalic and atraumatic.  Eyes: Conjunctivae are normal.  Neck: Normal range of motion.  Left lateral neck tender to palpation. Rotation of neck to the right elicits left neck and shoulder pain.   Cardiovascular: Normal rate and regular rhythm.  Exam reveals no gallop and no friction rub.   No murmur heard. Pulmonary/Chest: Effort normal and breath sounds normal. She has no wheezes. She has no rales. She exhibits no tenderness.  Abdominal: Soft. She exhibits no distension. There  is no tenderness. There is no rebound.  Musculoskeletal: Normal range of motion.  Neurological: She is alert and oriented to person, place, and time. Coordination normal.  Upper extremity strength and sensation equal and intact bilaterally. Speech is goal-oriented. Moves limbs without ataxia.   Skin: Skin is warm and dry.  Psychiatric: She has a normal mood and affect. Her behavior is normal.    ED Course  Procedures (including critical care time) Labs Reviewed  CBC WITH DIFFERENTIAL - Abnormal; Notable for the following:    Hemoglobin 16.1 (*)    HCT 47.3 (*)    All other components within normal limits  BASIC METABOLIC PANEL  POCT I-STAT TROPONIN I   Dg Chest 2 View  07/10/2012   *RADIOLOGY REPORT*  Clinical Data: Left shoulder, neck and  upper arm pain.  No known injury.  CHEST - 2 VIEW  Comparison: 05/25/2012  Findings: The heart, mediastinum and hila are unremarkable.  The lungs are hyperexpanded but clear.  No pleural effusion or pneumothorax.  The bony thorax is intact.  IMPRESSION: No acute cardiopulmonary disease.  Hyperexpanded lungs suggests COPD.  No change from the prior study.   Original Report Authenticated By: Amie Portland, M.D.   1. Cervical strain, acute, initial encounter     MDM  11:55 AM Labs pending. Chest xray shows no acute changes. EKG shows no acute changes.   2:08 PM Labs unremarkable. Patient reports some relief. Patient likely has musculoskeletal strain. I doubt cardiac or life threatening etiology at this point. Vitals stable and patient afebrile. Patient will be discharged with Percocet and Flexeril and instructed to return with worsening symptoms.   Emilia Beck, PA-C 07/10/12 1553

## 2012-08-06 ENCOUNTER — Ambulatory Visit: Payer: Self-pay

## 2012-11-08 ENCOUNTER — Ambulatory Visit: Payer: Medicaid Other | Attending: Internal Medicine | Admitting: Pharmacist

## 2012-11-08 VITALS — BP 126/84 | HR 68 | Temp 98.5°F | Resp 16

## 2012-11-08 DIAGNOSIS — Z23 Encounter for immunization: Secondary | ICD-10-CM

## 2012-11-08 DIAGNOSIS — Z029 Encounter for administrative examinations, unspecified: Secondary | ICD-10-CM | POA: Insufficient documentation

## 2012-11-08 NOTE — Progress Notes (Signed)
Patient here for flu vaccine and pneumonia vaccine Only gave flu our of pneumonia vaccine

## 2012-11-21 ENCOUNTER — Ambulatory Visit: Payer: Self-pay

## 2013-01-02 ENCOUNTER — Other Ambulatory Visit: Payer: Self-pay | Admitting: Internal Medicine

## 2013-02-15 ENCOUNTER — Ambulatory Visit: Payer: Medicaid Other | Attending: Internal Medicine | Admitting: Internal Medicine

## 2013-02-15 ENCOUNTER — Encounter: Payer: Self-pay | Admitting: Internal Medicine

## 2013-02-15 VITALS — BP 160/80 | HR 89 | Temp 99.1°F | Resp 16 | Wt 141.6 lb

## 2013-02-15 DIAGNOSIS — J069 Acute upper respiratory infection, unspecified: Secondary | ICD-10-CM | POA: Insufficient documentation

## 2013-02-15 DIAGNOSIS — J4489 Other specified chronic obstructive pulmonary disease: Secondary | ICD-10-CM | POA: Insufficient documentation

## 2013-02-15 DIAGNOSIS — E7800 Pure hypercholesterolemia, unspecified: Secondary | ICD-10-CM | POA: Insufficient documentation

## 2013-02-15 DIAGNOSIS — R05 Cough: Secondary | ICD-10-CM | POA: Insufficient documentation

## 2013-02-15 DIAGNOSIS — R059 Cough, unspecified: Secondary | ICD-10-CM | POA: Insufficient documentation

## 2013-02-15 DIAGNOSIS — F17200 Nicotine dependence, unspecified, uncomplicated: Secondary | ICD-10-CM | POA: Insufficient documentation

## 2013-02-15 DIAGNOSIS — I10 Essential (primary) hypertension: Secondary | ICD-10-CM | POA: Insufficient documentation

## 2013-02-15 DIAGNOSIS — Z720 Tobacco use: Secondary | ICD-10-CM

## 2013-02-15 DIAGNOSIS — Z139 Encounter for screening, unspecified: Secondary | ICD-10-CM

## 2013-02-15 DIAGNOSIS — J449 Chronic obstructive pulmonary disease, unspecified: Secondary | ICD-10-CM | POA: Insufficient documentation

## 2013-02-15 DIAGNOSIS — Z79899 Other long term (current) drug therapy: Secondary | ICD-10-CM | POA: Insufficient documentation

## 2013-02-15 DIAGNOSIS — J441 Chronic obstructive pulmonary disease with (acute) exacerbation: Secondary | ICD-10-CM

## 2013-02-15 DIAGNOSIS — Z23 Encounter for immunization: Secondary | ICD-10-CM

## 2013-02-15 MED ORDER — LISINOPRIL-HYDROCHLOROTHIAZIDE 10-12.5 MG PO TABS: 1.0000 | ORAL_TABLET | Freq: Every day | ORAL | Status: DC

## 2013-02-15 MED ORDER — IPRATROPIUM-ALBUTEROL 0.5-2.5 (3) MG/3ML IN SOLN: 3.0000 mL | RESPIRATORY_TRACT | Status: DC | PRN

## 2013-02-15 MED ORDER — AZITHROMYCIN 250 MG PO TABS: ORAL_TABLET | ORAL | Status: DC

## 2013-02-15 MED ORDER — BENZONATATE 100 MG PO CAPS: 100.0000 mg | ORAL_CAPSULE | Freq: Three times a day (TID) | ORAL | Status: DC | PRN

## 2013-02-15 NOTE — Progress Notes (Signed)
Patient states has not been feeling well past two days Has a cough at night and some congestion Today running a low grade temperature Recently done with antibiotics prescribed by her dentist Needs a prescription for a new nebulizer

## 2013-02-15 NOTE — Patient Instructions (Signed)
DASH Diet  The DASH diet stands for "Dietary Approaches to Stop Hypertension." It is a healthy eating plan that has been shown to reduce high blood pressure (hypertension) in as little as 14 days, while also possibly providing other significant health benefits. These other health benefits include reducing the risk of breast cancer after menopause and reducing the risk of type 2 diabetes, heart disease, colon cancer, and stroke. Health benefits also include weight loss and slowing kidney failure in patients with chronic kidney disease.   DIET GUIDELINES  · Limit salt (sodium). Your diet should contain less than 1500 mg of sodium daily.  · Limit refined or processed carbohydrates. Your diet should include mostly whole grains. Desserts and added sugars should be used sparingly.  · Include small amounts of heart-healthy fats. These types of fats include nuts, oils, and tub margarine. Limit saturated and trans fats. These fats have been shown to be harmful in the body.  CHOOSING FOODS   The following food groups are based on a 2000 calorie diet. See your Registered Dietitian for individual calorie needs.  Grains and Grain Products (6 to 8 servings daily)  · Eat More Often: Whole-wheat bread, brown rice, whole-grain or wheat pasta, quinoa, popcorn without added fat or salt (air popped).  · Eat Less Often: White bread, white pasta, white rice, cornbread.  Vegetables (4 to 5 servings daily)  · Eat More Often: Fresh, frozen, and canned vegetables. Vegetables may be raw, steamed, roasted, or grilled with a minimal amount of fat.  · Eat Less Often/Avoid: Creamed or fried vegetables. Vegetables in a cheese sauce.  Fruit (4 to 5 servings daily)  · Eat More Often: All fresh, canned (in natural juice), or frozen fruits. Dried fruits without added sugar. One hundred percent fruit juice (½ cup [237 mL] daily).  · Eat Less Often: Dried fruits with added sugar. Canned fruit in light or heavy syrup.  Lean Meats, Fish, and Poultry (2  servings or less daily. One serving is 3 to 4 oz [85-114 g]).  · Eat More Often: Ninety percent or leaner ground beef, tenderloin, sirloin. Round cuts of beef, chicken breast, turkey breast. All fish. Grill, bake, or broil your meat. Nothing should be fried.  · Eat Less Often/Avoid: Fatty cuts of meat, turkey, or chicken leg, thigh, or wing. Fried cuts of meat or fish.  Dairy (2 to 3 servings)  · Eat More Often: Low-fat or fat-free milk, low-fat plain or light yogurt, reduced-fat or part-skim cheese.  · Eat Less Often/Avoid: Milk (whole, 2%). Whole milk yogurt. Full-fat cheeses.  Nuts, Seeds, and Legumes (4 to 5 servings per week)  · Eat More Often: All without added salt.  · Eat Less Often/Avoid: Salted nuts and seeds, canned beans with added salt.  Fats and Sweets (limited)  · Eat More Often: Vegetable oils, tub margarines without trans fats, sugar-free gelatin. Mayonnaise and salad dressings.  · Eat Less Often/Avoid: Coconut oils, palm oils, butter, stick margarine, cream, half and half, cookies, candy, pie.  FOR MORE INFORMATION  The Dash Diet Eating Plan: www.dashdiet.org  Document Released: 12/09/2010 Document Revised: 03/14/2011 Document Reviewed: 12/09/2010  [employer]® Patient Information ©2014 [employer].

## 2013-02-15 NOTE — Progress Notes (Signed)
MRN: [id number] Name: Judith Scott  Sex: female Age: 58 y.o. DOB: 05-24-1955  Allergies: Review of patient's allergies indicates no known allergies.  Chief Complaint  Patient presents with  . URI    HPI: Patient is 58 y.o. female who comes today with symptoms of URI stuffy nose postnasal drip productive cough for the last few days the cough is worse at night patient has history of COPD and has been using her nebulizer, she's requesting refill on her medications her, today her blood pressure is elevated, denies any headache or dizziness but per patient she had blood pressure check done in the past and it was elevated she has not been on blood pressure medication, she is to smoke cigarettes advised to quit smoking she already has prescription for Chantix which she is going to try, patient denies any fever chills.  Past Medical History  Diagnosis Date  . COPD (chronic obstructive pulmonary disease)   . Hypercholesteremia   . COPD exacerbation 05/25/2012    History reviewed. No pertinent past surgical history.    Medication List       This list is accurate as of: 02/15/13 11:16 AM.  Always use your most recent med list.               albuterol 108 (90 BASE) MCG/ACT inhaler  Commonly known as:  PROVENTIL HFA;VENTOLIN HFA  Inhale 2 puffs into the lungs every 4 (four) hours as needed for wheezing.     albuterol (5 MG/ML) 0.5% nebulizer solution  Commonly known as:  PROVENTIL  TAKE 0.5MLS BY NEBULIZATION DAILY AS NEEDED FOR WHEEZING OR SHORTNESS OF BREATH     aspirin 81 MG chewable tablet  Chew 81 mg by mouth daily.     azithromycin 250 MG tablet  Commonly known as:  ZITHROMAX Z-PAK  Take as directed     benzonatate 100 MG capsule  Commonly known as:  TESSALON  Take 1 capsule (100 mg total) by mouth 3 (three) times daily as needed for cough.     GOODY HEADACHE PO  Take 1 packet by mouth once.     ipratropium-albuterol 0.5-2.5 (3) MG/3ML Soln  Commonly known as:   DUONEB  Take 3 mLs by nebulization every 4 (four) hours as needed.     lisinopril-hydrochlorothiazide 10-12.5 MG per tablet  Commonly known as:  PRINZIDE,ZESTORETIC  Take 1 tablet by mouth daily.     methocarbamol 500 MG tablet  Commonly known as:  ROBAXIN  Take 1 tablet (500 mg total) by mouth 2 (two) times daily as needed.     oxyCODONE-acetaminophen 5-325 MG per tablet  Commonly known as:  PERCOCET/ROXICET  Take 1-2 tablets by mouth every 4 (four) hours as needed for pain.     tiotropium 18 MCG inhalation capsule  Commonly known as:  SPIRIVA  Place 18 mcg into inhaler and inhale daily.     varenicline 0.5 MG tablet  Commonly known as:  CHANTIX  Take 0.5 mg by mouth 2 (two) times daily.        Meds ordered this encounter  Medications  . azithromycin (ZITHROMAX Z-PAK) 250 MG tablet    Sig: Take as directed    Dispense:  6 each    Refill:  0  . benzonatate (TESSALON) 100 MG capsule    Sig: Take 1 capsule (100 mg total) by mouth 3 (three) times daily as needed for cough.    Dispense:  30 capsule    Refill:  1  .  lisinopril-hydrochlorothiazide (PRINZIDE,ZESTORETIC) 10-12.5 MG per tablet    Sig: Take 1 tablet by mouth daily.    Dispense:  90 tablet    Refill:  3  . ipratropium-albuterol (DUONEB) 0.5-2.5 (3) MG/3ML SOLN    Sig: Take 3 mLs by nebulization every 4 (four) hours as needed.    Dispense:  360 mL    Refill:  2    Immunization History  Administered Date(s) Administered  . Influenza,inj,Quad PF,36+ Mos 11/08/2012    Family History  Problem Relation Age of Onset  . Cancer Mother   . Heart failure Father     History  Substance Use Topics  . Smoking status: Current Every Day Smoker -- 1.00 packs/day for 40 years    Types: Cigarettes  . Smokeless tobacco: Not on file  . Alcohol Use: No    Review of Systems   As noted in HPI  Filed Vitals:   02/15/13 1100  BP: 160/80  Pulse:   Temp:   Resp:     Physical Exam  Physical Exam    Constitutional: No distress.  HENT:  Some nasal congestion no sinus tenderness, pharyngeal erythema no exudate  Eyes: EOM are normal. Pupils are equal, round, and reactive to light.  Cardiovascular: Normal rate and regular rhythm.   Pulmonary/Chest: She has no wheezes. She has no rales.  Musculoskeletal: She exhibits no edema.    CBC    Component Value Date/Time   WBC 7.8 07/10/2012 1115   RBC 5.11 07/10/2012 1115   HGB 16.1* 07/10/2012 1115   HCT 47.3* 07/10/2012 1115   PLT 316 07/10/2012 1115   MCV 92.6 07/10/2012 1115   LYMPHSABS 1.7 07/10/2012 1115   MONOABS 0.7 07/10/2012 1115   EOSABS 0.2 07/10/2012 1115   BASOSABS 0.0 07/10/2012 1115    CMP     Component Value Date/Time   NA 137 07/10/2012 1115   K 4.2 07/10/2012 1115   CL 99 07/10/2012 1115   CO2 29 07/10/2012 1115   GLUCOSE 99 07/10/2012 1115   BUN 8 07/10/2012 1115   CREATININE 0.64 07/10/2012 1115   CALCIUM 9.4 07/10/2012 1115   GFRNONAA >90 07/10/2012 1115   GFRAA >90 07/10/2012 1115    No results found for this basename: chol,  tri,  ldl    No components found with this basename: hga1c    No results found for this basename: AST    Assessment and Plan  URI (upper respiratory infection) - Plan: azithromycin (ZITHROMAX Z-PAK) 250 MG tablet Also advise patient for saltwater gargles  Essential hypertension, benign - Plan: Patient is advised for- DASH diet, I have started patient on lisinopril-hydrochlorothiazide (PRINZIDE,ZESTORETIC) 10-12.5 MG per tablet, patient will come back in 2 weeks for BP check    COPD exacerbation - Plan: DME Nebulizer machine, ipratropium-albuterol (DUONEB) 0.5-2.5 (3) MG/3ML SOLN, Pneumococcal polysaccharide vaccine 23-valent greater than or equal to 2yo subcutaneous/IM  Tobacco abuse She is going to start taking Chantix.  Cough - Plan: benzonatate (TESSALON) 100 MG capsule  Screening - Plan: Fasting blood work  CBC with Differential, COMPLETE METABOLIC PANEL WITH GFR, Lipid panel, Vit D  25 hydroxy (rtn  osteoporosis monitoring), TSH, MM DIGITAL SCREENING BILATERAL  Need for prophylactic vaccination against Streptococcus pneumoniae (pneumococcus) - Plan: Pneumococcal polysaccharide vaccine 23-valent greater than or equal to 2yo subcutaneous/IM   Health Maintenance  -Mammogram: Ordered -Vaccinations:    -PNA (PPSV23) (one dose after 65) (or one dose before 65 if chronic conditions)    Return in about  3 months (around 05/15/2013) for BP check in 2 weeks .  Doris Cheadle, MD

## 2013-03-25 ENCOUNTER — Ambulatory Visit (HOSPITAL_COMMUNITY): Payer: Medicaid Other

## 2013-04-02 ENCOUNTER — Ambulatory Visit (HOSPITAL_COMMUNITY): Payer: Medicaid Other

## 2013-04-05 ENCOUNTER — Ambulatory Visit (HOSPITAL_COMMUNITY): Payer: Medicaid Other

## 2013-04-11 ENCOUNTER — Ambulatory Visit (HOSPITAL_COMMUNITY)
Admission: RE | Admit: 2013-04-11 | Discharge: 2013-04-11 | Disposition: A | Discharge status: Home / Self Care | Payer: Medicaid Other | Source: Ambulatory Visit | Attending: Internal Medicine | Admitting: Internal Medicine

## 2013-04-11 ENCOUNTER — Other Ambulatory Visit: Payer: Self-pay | Admitting: Internal Medicine

## 2013-04-11 DIAGNOSIS — Z139 Encounter for screening, unspecified: Secondary | ICD-10-CM

## 2013-04-11 DIAGNOSIS — Z1231 Encounter for screening mammogram for malignant neoplasm of breast: Secondary | ICD-10-CM

## 2013-05-08 ENCOUNTER — Ambulatory Visit: Payer: Medicaid Other | Attending: Internal Medicine

## 2013-05-08 DIAGNOSIS — Z139 Encounter for screening, unspecified: Secondary | ICD-10-CM

## 2013-05-08 LAB — CBC WITH DIFFERENTIAL/PLATELET
Basophils Absolute: 0.1 10*3/uL (ref 0.0–0.1)
Basophils Relative: 1 % (ref 0–1)
Eosinophils Absolute: 0.5 10*3/uL (ref 0.0–0.7)
Eosinophils Relative: 9 % — ABNORMAL HIGH (ref 0–5)
HEMATOCRIT: 43.1 % (ref 36.0–46.0)
Hemoglobin: 15.1 g/dL — ABNORMAL HIGH (ref 12.0–15.0)
Lymphocytes Relative: 32 % (ref 12–46)
Lymphs Abs: 2 10*3/uL (ref 0.7–4.0)
MCH: 30.4 pg (ref 26.0–34.0)
MCHC: 35 g/dL (ref 30.0–36.0)
MCV: 86.9 fL (ref 78.0–100.0)
MONOS PCT: 7 % (ref 3–12)
Monocytes Absolute: 0.4 10*3/uL (ref 0.1–1.0)
NEUTROS ABS: 3.1 10*3/uL (ref 1.7–7.7)
NEUTROS PCT: 51 % (ref 43–77)
PLATELETS: 418 10*3/uL — AB (ref 150–400)
RBC: 4.96 MIL/uL (ref 3.87–5.11)
RDW: 14 % (ref 11.5–15.5)
WBC: 6.1 10*3/uL (ref 4.0–10.5)

## 2013-05-08 LAB — LIPID PANEL
Cholesterol: 259 mg/dL — ABNORMAL HIGH (ref 0–200)
HDL: 47 mg/dL (ref >39)
LDL CALC: 184 mg/dL — AB (ref 0–99)
Total CHOL/HDL Ratio: 5.5 Ratio
Triglycerides: 141 mg/dL (ref <150)
VLDL: 28 mg/dL (ref 0–40)

## 2013-05-08 LAB — COMPLETE METABOLIC PANEL WITH GFR
ALBUMIN: 4.2 g/dL (ref 3.5–5.2)
ALK PHOS: 65 U/L (ref 39–117)
ALT: 9 U/L (ref 0–35)
AST: 15 U/L (ref 0–37)
BILIRUBIN TOTAL: 0.3 mg/dL (ref 0.2–1.2)
BUN: 11 mg/dL (ref 6–23)
CHLORIDE: 96 meq/L (ref 96–112)
CO2: 26 mEq/L (ref 19–32)
CREATININE: 0.8 mg/dL (ref 0.50–1.10)
Calcium: 9.4 mg/dL (ref 8.4–10.5)
GFR, Est Non African American: 80 mL/min
GLUCOSE: 90 mg/dL (ref 70–99)
Potassium: 4.5 mEq/L (ref 3.5–5.3)
SODIUM: 134 meq/L — AB (ref 135–145)
Total Protein: 7 g/dL (ref 6.0–8.3)

## 2013-05-08 LAB — TSH: TSH: 1.454 u[IU]/mL (ref 0.350–4.500)

## 2013-05-09 ENCOUNTER — Telehealth: Payer: Self-pay

## 2013-05-09 LAB — VITAMIN D 25 HYDROXY (VIT D DEFICIENCY, FRACTURES): Vit D, 25-Hydroxy: 45 ng/mL (ref 30–89)

## 2013-05-09 MED ORDER — ATORVASTATIN CALCIUM 20 MG PO TABS: 20.0000 mg | ORAL_TABLET | Freq: Every day | ORAL | Status: DC

## 2013-05-09 NOTE — Telephone Encounter (Signed)
Patient not available Left message on voice mail to return our call 

## 2013-05-09 NOTE — Telephone Encounter (Signed)
Message copied by Lestine Mount on Thu May 09, 2013 11:37 AM ------      Message from: Doris Cheadle      Created: Thu May 09, 2013 10:41 AM       Blood work reviewed, noticed hyperlipidemia, advise patient for low fat  diet and start taking Lipitor 20 mg daily. ------

## 2013-05-15 ENCOUNTER — Encounter: Payer: Self-pay | Admitting: Internal Medicine

## 2013-05-15 ENCOUNTER — Ambulatory Visit: Payer: Medicaid Other | Attending: Internal Medicine | Admitting: Internal Medicine

## 2013-05-15 VITALS — BP 110/69 | HR 78 | Temp 98.1°F | Resp 18 | Ht 64.0 in | Wt 134.4 lb

## 2013-05-15 DIAGNOSIS — E785 Hyperlipidemia, unspecified: Secondary | ICD-10-CM | POA: Insufficient documentation

## 2013-05-15 DIAGNOSIS — R0602 Shortness of breath: Secondary | ICD-10-CM | POA: Insufficient documentation

## 2013-05-15 DIAGNOSIS — Z79899 Other long term (current) drug therapy: Secondary | ICD-10-CM | POA: Insufficient documentation

## 2013-05-15 DIAGNOSIS — I10 Essential (primary) hypertension: Secondary | ICD-10-CM | POA: Insufficient documentation

## 2013-05-15 DIAGNOSIS — F17200 Nicotine dependence, unspecified, uncomplicated: Secondary | ICD-10-CM | POA: Diagnosis not present

## 2013-05-15 DIAGNOSIS — Z7982 Long term (current) use of aspirin: Secondary | ICD-10-CM | POA: Insufficient documentation

## 2013-05-15 DIAGNOSIS — J4489 Other specified chronic obstructive pulmonary disease: Secondary | ICD-10-CM | POA: Insufficient documentation

## 2013-05-15 DIAGNOSIS — J449 Chronic obstructive pulmonary disease, unspecified: Secondary | ICD-10-CM | POA: Insufficient documentation

## 2013-05-15 NOTE — Progress Notes (Signed)
Patient is for F/U for HTN and Hyperlipidemia after starting new meds.

## 2013-05-15 NOTE — Progress Notes (Signed)
Patient ID: Judith Scott, female   DOB: Aug 04, 1955, 58 y.o.   MRN: [id number]  CC: F/u HTN, HLD  HPI: Patient presents to clinic today for follow up visit on hypertension and hyperlipidemia. She reports she just began and Lipitor for high cholesterol last week and is aware of lifestyle modification she needs to make. Patient reports good compliance with medication regimen for hypertension in good dietary control of sodium intake.    No Known Allergies Past Medical History  Diagnosis Date  . COPD (chronic obstructive pulmonary disease)   . Hypercholesteremia   . COPD exacerbation 05/25/2012  . Hypertension    Current Outpatient Prescriptions on File Prior to Visit  Medication Sig Dispense Refill  . albuterol (PROVENTIL HFA;VENTOLIN HFA) 108 (90 BASE) MCG/ACT inhaler Inhale 2 puffs into the lungs every 4 (four) hours as needed for wheezing.      Marland Kitchen albuterol (PROVENTIL) (5 MG/ML) 0.5% nebulizer solution TAKE 0.5MLS BY NEBULIZATION DAILY AS NEEDED FOR WHEEZING OR SHORTNESS OF BREATH  20 mL  2  . aspirin 81 MG chewable tablet Chew 81 mg by mouth daily.      . Aspirin-Acetaminophen-Caffeine (GOODY HEADACHE PO) Take 1 packet by mouth once.      Marland Kitchen atorvastatin (LIPITOR) 20 MG tablet Take 1 tablet (20 mg total) by mouth daily.  30 tablet  3  . ipratropium-albuterol (DUONEB) 0.5-2.5 (3) MG/3ML SOLN Take 3 mLs by nebulization every 4 (four) hours as needed.  360 mL  2  . lisinopril-hydrochlorothiazide (PRINZIDE,ZESTORETIC) 10-12.5 MG per tablet Take 1 tablet by mouth daily.  90 tablet  3  . methocarbamol (ROBAXIN) 500 MG tablet Take 1 tablet (500 mg total) by mouth 2 (two) times daily as needed.  20 tablet  0  . tiotropium (SPIRIVA) 18 MCG inhalation capsule Place 18 mcg into inhaler and inhale daily.      Marland Kitchen azithromycin (ZITHROMAX Z-PAK) 250 MG tablet Take as directed  6 each  0  . benzonatate (TESSALON) 100 MG capsule Take 1 capsule (100 mg total) by mouth 3 (three) times daily as needed for cough.  30  capsule  1  . oxyCODONE-acetaminophen (PERCOCET/ROXICET) 5-325 MG per tablet Take 1-2 tablets by mouth every 4 (four) hours as needed for pain.  8 tablet  0  . varenicline (CHANTIX) 0.5 MG tablet Take 0.5 mg by mouth 2 (two) times daily.       No current facility-administered medications on file prior to visit.   Family History  Problem Relation Age of Onset  . Cancer Mother   . Heart failure Father    History   Social History  . Marital Status: Legally Separated    Spouse Name: N/A    Number of Children: N/A  . Years of Education: N/A   Occupational History  . Not on file.   Social History Main Topics  . Smoking status: Current Every Day Smoker -- 1.00 packs/day for 40 years    Types: Cigarettes  . Smokeless tobacco: Not on file  . Alcohol Use: No  . Drug Use: No  . Sexual Activity: Not on file   Other Topics Concern  . Not on file   Social History Narrative  . No narrative on file    Review of Systems  Constitutional: Negative for fever and chills.  Eyes: Negative for blurred vision.  Respiratory: Positive for shortness of breath (hx of COPD).   Cardiovascular: Negative for chest pain, palpitations, claudication and leg swelling.  Musculoskeletal:  Cramping in toes  Neurological: Positive for headaches. Negative for dizziness, tingling and sensory change.     Objective:   Filed Vitals:   05/15/13 1545  BP: 110/69  Pulse: 78  Temp: 98.1 F (36.7 C)  Resp: 18    Physical Exam  Vitals reviewed. Constitutional: She is oriented to person, place, and time. She appears well-developed.  Eyes: Pupils are equal, round, and reactive to light.  Neck: Normal range of motion. Neck supple. No JVD present.  Cardiovascular: Normal rate, regular rhythm and normal heart sounds.   Pulmonary/Chest: Effort normal and breath sounds normal. She has no wheezes.  Abdominal: Soft. Bowel sounds are normal.  Lymphadenopathy:    She has no cervical adenopathy.   Neurological: She is alert and oriented to person, place, and time.  Skin: Skin is warm and dry.  Psychiatric: She has a normal mood and affect.     Lab Results  Component Value Date   WBC 6.1 05/08/2013   HGB 15.1* 05/08/2013   HCT 43.1 05/08/2013   MCV 86.9 05/08/2013   PLT 418* 05/08/2013   Lab Results  Component Value Date   CREATININE 0.80 05/08/2013   BUN 11 05/08/2013   NA 134* 05/08/2013   K 4.5 05/08/2013   CL 96 05/08/2013   CO2 26 05/08/2013    No results found for this basename: HGBA1C   Lipid Panel     Component Value Date/Time   CHOL 259* 05/08/2013 0859   TRIG 141 05/08/2013 0859   HDL 47 05/08/2013 0859   CHOLHDL 5.5 05/08/2013 0859   VLDL 28 05/08/2013 0859   LDLCALC 184* 05/08/2013 0859       Assessment and plan:   Judith Scott was seen today for follow-up, hypertension, copd and hyperlipidemia.  Diagnoses and associated orders for this visit:  HTN (hypertension) Continue current medication regimen, patient has great control HLD (hyperlipidemia) Continue current medication regimen. Specific dietary instructions given Tobacco use disorder Smoking cessation discussed and printed materials given    Return in about 6 months (around 11/15/2013) for Hypertension, HLD, COPD.      Holland Commons, NP-C [facility] [phone] 05/15/2013, 4:07 PM

## 2013-05-15 NOTE — Patient Instructions (Signed)
Smoking Cessation Quitting smoking is important to your health and has many advantages. However, it is not always easy to quit since nicotine is a very addictive drug. Often times, people try 3 times or more before being able to quit. This document explains the best ways for you to prepare to quit smoking. Quitting takes hard work and a lot of effort, but you can do it. ADVANTAGES OF QUITTING SMOKING  You will live longer, feel better, and live better.  Your body will feel the impact of quitting smoking almost immediately.  Within 20 minutes, blood pressure decreases. Your pulse returns to its normal level.  After 8 hours, carbon monoxide levels in the blood return to normal. Your oxygen level increases.  After 24 hours, the chance of having a heart attack starts to decrease. Your breath, hair, and body stop smelling like smoke.  After 48 hours, damaged nerve endings begin to recover. Your sense of taste and smell improve.  After 72 hours, the body is virtually free of nicotine. Your bronchial tubes relax and breathing becomes easier.  After 2 to 12 weeks, lungs can hold more air. Exercise becomes easier and circulation improves.  The risk of having a heart attack, stroke, cancer, or lung disease is greatly reduced.  After 1 year, the risk of coronary heart disease is cut in half.  After 5 years, the risk of stroke falls to the same as a nonsmoker.  After 10 years, the risk of lung cancer is cut in half and the risk of other cancers decreases significantly.  After 15 years, the risk of coronary heart disease drops, usually to the level of a nonsmoker.  If you are pregnant, quitting smoking will improve your chances of having a healthy baby.  The people you live with, especially any children, will be healthier.  You will have extra money to spend on things other than cigarettes. QUESTIONS TO THINK ABOUT BEFORE ATTEMPTING TO QUIT You may want to talk about your answers with your  caregiver.  Why do you want to quit?  If you tried to quit in the past, what helped and what did not?  What will be the most difficult situations for you after you quit? How will you plan to handle them?  Who can help you through the tough times? Your family? Friends? A caregiver?  What pleasures do you get from smoking? What ways can you still get pleasure if you quit? Here are some questions to ask your caregiver:  How can you help me to be successful at quitting?  What medicine do you think would be best for me and how should I take it?  What should I do if I need more help?  What is smoking withdrawal like? How can I get information on withdrawal? GET READY  Set a quit date.  Change your environment by getting rid of all cigarettes, ashtrays, matches, and lighters in your home, car, or work. Do not let people smoke in your home.  Review your past attempts to quit. Think about what worked and what did not. GET SUPPORT AND ENCOURAGEMENT You have a better chance of being successful if you have help. You can get support in many ways.  Tell your family, friends, and co-workers that you are going to quit and need their support. Ask them not to smoke around you.  Get individual, group, or telephone counseling and support. Programs are available at local hospitals and health centers. Call your local health department for   information about programs in your area.  Spiritual beliefs and practices may help some smokers quit.  Download a "quit meter" on your computer to keep track of quit statistics, such as how long you have gone without smoking, cigarettes not smoked, and money saved.  Get a self-help book about quitting smoking and staying off of tobacco. LEARN NEW SKILLS AND BEHAVIORS  Distract yourself from urges to smoke. Talk to someone, go for a walk, or occupy your time with a task.  Change your normal routine. Take a different route to work. Drink tea instead of coffee.  Eat breakfast in a different place.  Reduce your stress. Take a hot bath, exercise, or read a book.  Plan something enjoyable to do every day. Reward yourself for not smoking.  Explore interactive web-based programs that specialize in helping you quit. GET MEDICINE AND USE IT CORRECTLY Medicines can help you stop smoking and decrease the urge to smoke. Combining medicine with the above behavioral methods and support can greatly increase your chances of successfully quitting smoking.  Nicotine replacement therapy helps deliver nicotine to your body without the negative effects and risks of smoking. Nicotine replacement therapy includes nicotine gum, lozenges, inhalers, nasal sprays, and skin patches. Some may be available over-the-counter and others require a prescription.  Antidepressant medicine helps people abstain from smoking, but how this works is unknown. This medicine is available by prescription.  Nicotinic receptor partial agonist medicine simulates the effect of nicotine in your brain. This medicine is available by prescription. Ask your caregiver for advice about which medicines to use and how to use them based on your health history. Your caregiver will tell you what side effects to look out for if you choose to be on a medicine or therapy. Carefully read the information on the package. Do not use any other product containing nicotine while using a nicotine replacement product.  RELAPSE OR DIFFICULT SITUATIONS Most relapses occur within the first 3 months after quitting. Do not be discouraged if you start smoking again. Remember, most people try several times before finally quitting. You may have symptoms of withdrawal because your body is used to nicotine. You may crave cigarettes, be irritable, feel very hungry, cough often, get headaches, or have difficulty concentrating. The withdrawal symptoms are only temporary. They are strongest when you first quit, but they will go away within  10 14 days. To reduce the chances of relapse, try to:  Avoid drinking alcohol. Drinking lowers your chances of successfully quitting.  Reduce the amount of caffeine you consume. Once you quit smoking, the amount of caffeine in your body increases and can give you symptoms, such as a rapid heartbeat, sweating, and anxiety.  Avoid smokers because they can make you want to smoke.  Do not let weight gain distract you. Many smokers will gain weight when they quit, usually less than 10 pounds. Eat a healthy diet and stay active. You can always lose the weight gained after you quit.  Find ways to improve your mood other than smoking. FOR MORE INFORMATION  www.smokefree.gov  Document Released: 12/14/2000 Document Revised: 06/21/2011 Document Reviewed: 03/31/2011 Exit[employer] Patient Information 2014 [employer].  DASH Diet The DASH diet stands for "Dietary Approaches to Stop Hypertension." It is a healthy eating plan that has been shown to reduce high blood pressure (hypertension) in as little as 14 days, while also possibly providing other significant health benefits. These other health benefits include reducing the risk of breast cancer after menopause and reducing   the risk of type 2 diabetes, heart disease, colon cancer, and stroke. Health benefits also include weight loss and slowing kidney failure in patients with chronic kidney disease.  DIET GUIDELINES  Limit salt (sodium). Your diet should contain less than 1500 mg of sodium daily.  Limit refined or processed carbohydrates. Your diet should include mostly whole grains. Desserts and added sugars should be used sparingly.  Include small amounts of heart-healthy fats. These types of fats include nuts, oils, and tub margarine. Limit saturated and trans fats. These fats have been shown to be harmful in the body. CHOOSING FOODS  The following food groups are based on a 2000 calorie diet. See your Registered Dietitian for individual calorie  needs. Grains and Grain Products (6 to 8 servings daily)  Eat More Often: Whole-wheat bread, brown rice, whole-grain or wheat pasta, quinoa, popcorn without added fat or salt (air popped).  Eat Less Often: White bread, white pasta, white rice, cornbread. Vegetables (4 to 5 servings daily)  Eat More Often: Fresh, frozen, and canned vegetables. Vegetables may be raw, steamed, roasted, or grilled with a minimal amount of fat.  Eat Less Often/Avoid: Creamed or fried vegetables. Vegetables in a cheese sauce. Fruit (4 to 5 servings daily)  Eat More Often: All fresh, canned (in natural juice), or frozen fruits. Dried fruits without added sugar. One hundred percent fruit juice ( cup [237 mL] daily).  Eat Less Often: Dried fruits with added sugar. Canned fruit in light or heavy syrup. Lean Meats, Fish, and Poultry (2 servings or less daily. One serving is 3 to 4 oz [85-114 g]).  Eat More Often: Ninety percent or leaner ground beef, tenderloin, sirloin. Round cuts of beef, chicken breast, turkey breast. All fish. Grill, bake, or broil your meat. Nothing should be fried.  Eat Less Often/Avoid: Fatty cuts of meat, turkey, or chicken leg, thigh, or wing. Fried cuts of meat or fish. Dairy (2 to 3 servings)  Eat More Often: Low-fat or fat-free milk, low-fat plain or light yogurt, reduced-fat or part-skim cheese.  Eat Less Often/Avoid: Milk (whole, 2%).Whole milk yogurt. Full-fat cheeses. Nuts, Seeds, and Legumes (4 to 5 servings per week)  Eat More Often: All without added salt.  Eat Less Often/Avoid: Salted nuts and seeds, canned beans with added salt. Fats and Sweets (limited)  Eat More Often: Vegetable oils, tub margarines without trans fats, sugar-free gelatin. Mayonnaise and salad dressings.  Eat Less Often/Avoid: Coconut oils, palm oils, butter, stick margarine, cream, half and half, cookies, candy, pie. FOR MORE INFORMATION The Dash Diet Eating Plan: www.dashdiet.org Document  Released: 12/09/2010 Document Revised: 03/14/2011 Document Reviewed: 12/09/2010 Exit[employer] Patient Information 2014 [employer].  

## 2013-06-03 ENCOUNTER — Other Ambulatory Visit: Payer: Self-pay | Admitting: Internal Medicine

## 2013-06-03 ENCOUNTER — Telehealth: Payer: Self-pay | Admitting: *Deleted

## 2013-06-03 DIAGNOSIS — J441 Chronic obstructive pulmonary disease with (acute) exacerbation: Secondary | ICD-10-CM

## 2013-06-03 MED ORDER — ALBUTEROL SULFATE HFA 108 (90 BASE) MCG/ACT IN AERS: 2.0000 | INHALATION_SPRAY | RESPIRATORY_TRACT | Status: DC | PRN

## 2013-06-03 NOTE — Telephone Encounter (Signed)
Patient called in for refill on inhaler.

## 2013-06-04 ENCOUNTER — Emergency Department (HOSPITAL_COMMUNITY)
Admission: EM | Admit: 2013-06-04 | Discharge: 2013-06-04 | Disposition: A | Discharge status: Home / Self Care | Payer: Medicaid Other | Attending: Emergency Medicine | Admitting: Emergency Medicine

## 2013-06-04 ENCOUNTER — Emergency Department (HOSPITAL_COMMUNITY): Payer: Medicaid Other

## 2013-06-04 ENCOUNTER — Encounter (HOSPITAL_COMMUNITY): Payer: Self-pay | Admitting: Emergency Medicine

## 2013-06-04 DIAGNOSIS — J441 Chronic obstructive pulmonary disease with (acute) exacerbation: Secondary | ICD-10-CM | POA: Insufficient documentation

## 2013-06-04 DIAGNOSIS — Z79899 Other long term (current) drug therapy: Secondary | ICD-10-CM | POA: Insufficient documentation

## 2013-06-04 DIAGNOSIS — M79601 Pain in right arm: Secondary | ICD-10-CM

## 2013-06-04 DIAGNOSIS — I10 Essential (primary) hypertension: Secondary | ICD-10-CM | POA: Insufficient documentation

## 2013-06-04 DIAGNOSIS — E7800 Pure hypercholesterolemia, unspecified: Secondary | ICD-10-CM | POA: Insufficient documentation

## 2013-06-04 DIAGNOSIS — F17200 Nicotine dependence, unspecified, uncomplicated: Secondary | ICD-10-CM | POA: Insufficient documentation

## 2013-06-04 DIAGNOSIS — M25519 Pain in unspecified shoulder: Secondary | ICD-10-CM | POA: Insufficient documentation

## 2013-06-04 DIAGNOSIS — Z7982 Long term (current) use of aspirin: Secondary | ICD-10-CM | POA: Insufficient documentation

## 2013-06-04 MED ORDER — NAPROXEN 500 MG PO TABS: 500.0000 mg | ORAL_TABLET | Freq: Two times a day (BID) | ORAL | Status: DC

## 2013-06-04 MED ORDER — OXYCODONE-ACETAMINOPHEN 5-325 MG PO TABS: 1.0000 | ORAL_TABLET | ORAL | Status: DC | PRN

## 2013-06-04 NOTE — ED Notes (Signed)
PA advised of lower pressure.   Patient is asymptomatic.   Patient states she took new bp meds this morning.   Okay to discharge per PA.

## 2013-06-04 NOTE — ED Provider Notes (Signed)
CSN: [id number]     Arrival date & time 06/04/13  1001 History  This chart was scribed for non-physician practitioner Sharilyn Sites, PA-C working with Doug Sou, MD by Leone Payor, ED Scribe. This patient was seen in room TR10C/TR10C and the patient's care was started at 11:12 AM.    Chief Complaint  Patient presents with  . Arm Pain      The history is provided by the patient. No language interpreter was used.    HPI Comments: Judith Scott is a 58 y.o. female who presents to the Emergency Department complaining of constant, unchanged right upper arm and right shoulder pain that began yesterday. Patient states she was recently playing with her grandchildren as well as lifting a case of drinks prior to the onset of pain. She denies recent falls or trauma. She states movement of the RUE worsens her pain; rest eases the pain. She has taken tylenol-3 with minimal relief. She denies numbness, paresthesia, chest pain, SOB.   Past Medical History  Diagnosis Date  . COPD (chronic obstructive pulmonary disease)   . Hypercholesteremia   . COPD exacerbation 05/25/2012  . Hypertension    Past Surgical History  Procedure Laterality Date  . Appendectomy     Family History  Problem Relation Age of Onset  . Cancer Mother   . Heart failure Father    History  Substance Use Topics  . Smoking status: Current Every Day Smoker -- 1.00 packs/day for 40 years    Types: Cigarettes  . Smokeless tobacco: Not on file  . Alcohol Use: No   OB History   Grav Para Term Preterm Abortions TAB SAB Ect Mult Living                 Review of Systems  Respiratory: Negative for shortness of breath.   Cardiovascular: Negative for chest pain.  Musculoskeletal: Positive for arthralgias.  Neurological: Negative for numbness.  All other systems reviewed and are negative.     Allergies  Review of patient's allergies indicates no known allergies.  Home Medications   Prior to Admission medications    Medication Sig Start Date End Date Taking? Authorizing Provider  albuterol (PROVENTIL HFA;VENTOLIN HFA) 108 (90 BASE) MCG/ACT inhaler Inhale 2 puffs into the lungs every 4 (four) hours as needed for wheezing. 06/03/13   Doris Cheadle, MD  albuterol (PROVENTIL) (5 MG/ML) 0.5% nebulizer solution TAKE 0.5MLS BY NEBULIZATION DAILY AS NEEDED FOR WHEEZING OR SHORTNESS OF BREATH 01/02/13   Shanker Levora Dredge, MD  aspirin 81 MG chewable tablet Chew 81 mg by mouth daily.    Historical Provider, MD  Aspirin-Acetaminophen-Caffeine (GOODY HEADACHE PO) Take 1 packet by mouth once.    Historical Provider, MD  atorvastatin (LIPITOR) 20 MG tablet Take 1 tablet (20 mg total) by mouth daily. 05/09/13   Doris Cheadle, MD  azithromycin (ZITHROMAX Z-PAK) 250 MG tablet Take as directed 02/15/13   Doris Cheadle, MD  benzonatate (TESSALON) 100 MG capsule Take 1 capsule (100 mg total) by mouth 3 (three) times daily as needed for cough. 02/15/13   Doris Cheadle, MD  ipratropium-albuterol (DUONEB) 0.5-2.5 (3) MG/3ML SOLN Take 3 mLs by nebulization every 4 (four) hours as needed. 02/15/13   Doris Cheadle, MD  lisinopril-hydrochlorothiazide (PRINZIDE,ZESTORETIC) 10-12.5 MG per tablet Take 1 tablet by mouth daily. 02/15/13   Doris Cheadle, MD  methocarbamol (ROBAXIN) 500 MG tablet Take 1 tablet (500 mg total) by mouth 2 (two) times daily as needed. 07/10/12   Emilia Beck, PA-C  oxyCODONE-acetaminophen (PERCOCET/ROXICET) 5-325 MG per tablet Take 1-2 tablets by mouth every 4 (four) hours as needed for pain. 07/10/12   Kaitlyn Szekalski, PA-C  tiotropium (SPIRIVA) 18 MCG inhalation capsule Place 18 mcg into inhaler and inhale daily. 06/06/12   Dorothea Ogle, MD  varenicline (CHANTIX) 0.5 MG tablet Take 0.5 mg by mouth 2 (two) times daily. 06/06/12   Dorothea Ogle, MD   BP 131/56  Pulse 88  Temp(Src) 98.3 F (36.8 C)  Resp 18  Wt 134 lb (60.782 kg)  SpO2 94%  Physical Exam  Nursing note and vitals reviewed. Constitutional: She is  oriented to person, place, and time. She appears well-developed and well-nourished.  HENT:  Head: Normocephalic and atraumatic.  Mouth/Throat: Oropharynx is clear and moist.  Eyes: Conjunctivae and EOM are normal. Pupils are equal, round, and reactive to light.  Neck: Normal range of motion.  Cardiovascular: Normal rate, regular rhythm and normal heart sounds.   Pulmonary/Chest: Breath sounds normal. No respiratory distress. She has no wheezes.  Musculoskeletal: Normal range of motion.  Right shoulder with tenderness along deltoid region, limited ROM in all directions due to pain; Full ROM of elbow and wrist without pain; strong radial pulse and cap refill; sensation intact diffusely throughout RUE  Neurological: She is alert and oriented to person, place, and time.  Skin: Skin is warm and dry.  Psychiatric: She has a normal mood and affect.    ED Course  Procedures (including critical care time)  DIAGNOSTIC STUDIES: Oxygen Saturation is 94% on RA, adequate by my interpretation.    COORDINATION OF CARE: 11:15 AM Discussed treatment plan with pt at bedside and pt agreed to plan.   Labs Review Labs Reviewed - No data to display  Imaging Review Dg Shoulder Right  06/04/2013   CLINICAL DATA:  Right shoulder pain and humerus pain.  EXAM: RIGHT SHOULDER - 2+ VIEW  COMPARISON:  None.  FINDINGS: There is minimal degenerative change of the [facility] joint. There is no evidence of fracture dislocation.  IMPRESSION: No acute findings.   Electronically Signed   By: Elberta Fortis M.D.   On: 06/04/2013 10:57     EKG Interpretation None      MDM   Final diagnoses:  Right arm pain   58 year-old with right arm pain after lifting her granddaughter in several cases of soda 2 days ago. She denies any radiation of pain into her chest, shortness of breath, palpitations, dizziness, or weakness. X-rays negative for acute fracture. On exam patient has tenderness to her deltoid region and significantly  limited range of motion in all directions.  i have concern that she has possibly sustained a rotator cuff injury. I do not feel that her symptoms today represent atypical ACS. Right arm placed in a shoulder sling for comfort and she was given orthopedic followup. Percocet given for pain.  Discussed plan with patient, he/she acknowledged understanding and agreed with plan of care.  Return precautions given for new or worsening symptoms.  I personally performed the services described in this documentation, which was scribed in my presence. The recorded information has been reviewed and is accurate.  Garlon Hatchet, PA-C 06/04/13 [phone]

## 2013-06-04 NOTE — ED Provider Notes (Signed)
Medical screening examination/treatment/procedure(s) were performed by non-physician practitioner and as supervising physician I was immediately available for consultation/collaboration.   EKG Interpretation None       Doug Sou, MD 06/04/13 1606

## 2013-06-04 NOTE — ED Notes (Addendum)
Per pt sts that since playing with her grand child and lifting some heavy drinks yesterday she has been having severe right arm pain and hurts to lift arm. sts pain is mainly in shoulder and upper right arm.

## 2013-06-04 NOTE — Discharge Instructions (Signed)
Take the prescribed medication as directed.  Wear sling for comfort and support. Follow-up with orthopedics for further evaluation. Return to the ED for new or worsening symptoms.

## 2013-06-10 ENCOUNTER — Other Ambulatory Visit: Payer: Self-pay | Admitting: Internal Medicine

## 2013-07-02 ENCOUNTER — Other Ambulatory Visit: Payer: Self-pay | Admitting: Internal Medicine

## 2013-07-02 ENCOUNTER — Telehealth: Payer: Self-pay | Admitting: Internal Medicine

## 2013-07-02 NOTE — Telephone Encounter (Signed)
Pt says she has been using the recently prescribed inhaler but says she prefers the one she was using previously, prescribed by Dr Izola Price.  Please f/u with pt regarding medication change.

## 2013-07-03 NOTE — Telephone Encounter (Signed)
Pt requesting refill of prior inhaler vs new script. Please f/u with pt

## 2013-07-03 NOTE — Progress Notes (Unsigned)
Patient's form for disability placard has been completed and Is at the front desk for the patient to pick up

## 2013-07-04 NOTE — Telephone Encounter (Signed)
Can we switch this patient inhaler to the One she used to use?

## 2013-07-08 ENCOUNTER — Telehealth: Payer: Self-pay

## 2013-07-08 MED ORDER — ALBUTEROL SULFATE HFA 108 (90 BASE) MCG/ACT IN AERS: 2.0000 | INHALATION_SPRAY | Freq: Four times a day (QID) | RESPIRATORY_TRACT | Status: DC | PRN

## 2013-07-08 MED ORDER — TIOTROPIUM BROMIDE MONOHYDRATE 18 MCG IN CAPS: 18.0000 ug | ORAL_CAPSULE | Freq: Every day | RESPIRATORY_TRACT | Status: DC

## 2013-07-08 NOTE — Telephone Encounter (Signed)
Yes

## 2013-07-08 NOTE — Telephone Encounter (Signed)
Spoke with patient  Medications sent to [facility] on [address]

## 2013-08-05 ENCOUNTER — Other Ambulatory Visit: Payer: Self-pay | Admitting: Internal Medicine

## 2013-09-04 ENCOUNTER — Other Ambulatory Visit: Payer: Self-pay | Admitting: Internal Medicine

## 2013-10-03 ENCOUNTER — Other Ambulatory Visit: Payer: Self-pay | Admitting: Internal Medicine

## 2013-10-09 ENCOUNTER — Telehealth: Payer: Self-pay | Admitting: Internal Medicine

## 2013-10-09 ENCOUNTER — Other Ambulatory Visit: Payer: Self-pay | Admitting: Emergency Medicine

## 2013-10-09 MED ORDER — TIOTROPIUM BROMIDE MONOHYDRATE 18 MCG IN CAPS: 18.0000 ug | ORAL_CAPSULE | Freq: Every day | RESPIRATORY_TRACT | Status: DC

## 2013-10-09 NOTE — Telephone Encounter (Signed)
Pt. States that pharmacy has sent numerous request for medication refill on her tiotropium (SPIRIVA) 18 MCG inhalation capsule [[id number]]  please contact patient in regards to refill, she is completely out of her medication

## 2013-10-13 ENCOUNTER — Emergency Department (HOSPITAL_COMMUNITY): Payer: Medicaid Other

## 2013-10-13 ENCOUNTER — Encounter (HOSPITAL_COMMUNITY): Payer: Self-pay | Admitting: Emergency Medicine

## 2013-10-13 ENCOUNTER — Inpatient Hospital Stay (HOSPITAL_COMMUNITY)
Admission: EM | Admit: 2013-10-13 | Discharge: 2013-10-15 | DRG: 190 | Disposition: A | Discharge status: Home / Self Care | Payer: Medicaid Other | Attending: Internal Medicine | Admitting: Internal Medicine

## 2013-10-13 DIAGNOSIS — J9601 Acute respiratory failure with hypoxia: Secondary | ICD-10-CM | POA: Diagnosis present

## 2013-10-13 DIAGNOSIS — I10 Essential (primary) hypertension: Secondary | ICD-10-CM | POA: Diagnosis present

## 2013-10-13 DIAGNOSIS — R0902 Hypoxemia: Secondary | ICD-10-CM

## 2013-10-13 DIAGNOSIS — J441 Chronic obstructive pulmonary disease with (acute) exacerbation: Secondary | ICD-10-CM | POA: Diagnosis present

## 2013-10-13 DIAGNOSIS — E860 Dehydration: Secondary | ICD-10-CM | POA: Diagnosis present

## 2013-10-13 DIAGNOSIS — Z720 Tobacco use: Secondary | ICD-10-CM

## 2013-10-13 DIAGNOSIS — E878 Other disorders of electrolyte and fluid balance, not elsewhere classified: Secondary | ICD-10-CM | POA: Diagnosis present

## 2013-10-13 DIAGNOSIS — R0602 Shortness of breath: Secondary | ICD-10-CM | POA: Diagnosis not present

## 2013-10-13 DIAGNOSIS — F17210 Nicotine dependence, cigarettes, uncomplicated: Secondary | ICD-10-CM | POA: Diagnosis present

## 2013-10-13 DIAGNOSIS — E780 Pure hypercholesterolemia: Secondary | ICD-10-CM | POA: Diagnosis present

## 2013-10-13 DIAGNOSIS — R911 Solitary pulmonary nodule: Secondary | ICD-10-CM | POA: Diagnosis present

## 2013-10-13 DIAGNOSIS — E785 Hyperlipidemia, unspecified: Secondary | ICD-10-CM | POA: Diagnosis present

## 2013-10-13 DIAGNOSIS — E871 Hypo-osmolality and hyponatremia: Secondary | ICD-10-CM

## 2013-10-13 DIAGNOSIS — Z8249 Family history of ischemic heart disease and other diseases of the circulatory system: Secondary | ICD-10-CM

## 2013-10-13 DIAGNOSIS — Z7982 Long term (current) use of aspirin: Secondary | ICD-10-CM

## 2013-10-13 DIAGNOSIS — Z79899 Other long term (current) drug therapy: Secondary | ICD-10-CM

## 2013-10-13 DIAGNOSIS — Z7952 Long term (current) use of systemic steroids: Secondary | ICD-10-CM

## 2013-10-13 LAB — I-STAT ARTERIAL BLOOD GAS, ED
BICARBONATE: 25.6 meq/L — AB (ref 20.0–24.0)
O2 SAT: 92 %
PCO2 ART: 42.8 mmHg (ref 35.0–45.0)
PH ART: 7.384 (ref 7.350–7.450)
TCO2: 27 mmol/L (ref 0–100)
pO2, Arterial: 66 mmHg — ABNORMAL LOW (ref 80.0–100.0)

## 2013-10-13 LAB — CBC WITH DIFFERENTIAL/PLATELET
Basophils Absolute: 0 10*3/uL (ref 0.0–0.1)
Basophils Relative: 0 % (ref 0–1)
EOS ABS: 0 10*3/uL (ref 0.0–0.7)
EOS PCT: 0 % (ref 0–5)
HCT: 41 % (ref 36.0–46.0)
HEMOGLOBIN: 14.5 g/dL (ref 12.0–15.0)
Lymphocytes Relative: 10 % — ABNORMAL LOW (ref 12–46)
Lymphs Abs: 0.7 10*3/uL (ref 0.7–4.0)
MCH: 31.9 pg (ref 26.0–34.0)
MCHC: 35.4 g/dL (ref 30.0–36.0)
MCV: 90.3 fL (ref 78.0–100.0)
MONOS PCT: 11 % (ref 3–12)
Monocytes Absolute: 0.8 10*3/uL (ref 0.1–1.0)
Neutro Abs: 5.5 10*3/uL (ref 1.7–7.7)
Neutrophils Relative %: 79 % — ABNORMAL HIGH (ref 43–77)
Platelets: 246 10*3/uL (ref 150–400)
RBC: 4.54 MIL/uL (ref 3.87–5.11)
RDW: 12.8 % (ref 11.5–15.5)
WBC: 7 10*3/uL (ref 4.0–10.5)

## 2013-10-13 LAB — TROPONIN I: Troponin I: <0.3 ng/mL (ref <0.30)

## 2013-10-13 LAB — URINALYSIS, ROUTINE W REFLEX MICROSCOPIC
Bilirubin Urine: NEGATIVE
GLUCOSE, UA: NEGATIVE mg/dL
Hgb urine dipstick: NEGATIVE
KETONES UR: NEGATIVE mg/dL
LEUKOCYTES UA: NEGATIVE
Nitrite: NEGATIVE
PROTEIN: NEGATIVE mg/dL
Specific Gravity, Urine: 1.013 (ref 1.005–1.030)
Urobilinogen, UA: 0.2 mg/dL (ref 0.0–1.0)
pH: 5.5 (ref 5.0–8.0)

## 2013-10-13 LAB — COMPREHENSIVE METABOLIC PANEL
ALK PHOS: 72 U/L (ref 39–117)
ALT: 12 U/L (ref 0–35)
ANION GAP: 15 (ref 5–15)
AST: 23 U/L (ref 0–37)
Albumin: 3.9 g/dL (ref 3.5–5.2)
BUN: 11 mg/dL (ref 6–23)
CALCIUM: 9.3 mg/dL (ref 8.4–10.5)
CO2: 24 mEq/L (ref 19–32)
CREATININE: 0.67 mg/dL (ref 0.50–1.10)
Chloride: 88 mEq/L — ABNORMAL LOW (ref 96–112)
GFR calc non Af Amer: >90 mL/min (ref >90)
GLUCOSE: 109 mg/dL — AB (ref 70–99)
POTASSIUM: 4.1 meq/L (ref 3.7–5.3)
Sodium: 127 mEq/L — ABNORMAL LOW (ref 137–147)
TOTAL PROTEIN: 7.9 g/dL (ref 6.0–8.3)
Total Bilirubin: 0.3 mg/dL (ref 0.3–1.2)

## 2013-10-13 LAB — I-STAT CG4 LACTIC ACID, ED: Lactic Acid, Venous: 1.26 mmol/L (ref 0.5–2.2)

## 2013-10-13 LAB — PRO B NATRIURETIC PEPTIDE: Pro B Natriuretic peptide (BNP): 108.2 pg/mL (ref 0–125)

## 2013-10-13 MED ORDER — LEVOFLOXACIN IN D5W 750 MG/150ML IV SOLN
750.0000 mg | INTRAVENOUS | Status: DC
  Administered 2013-10-13 – 2013-10-14 (×2): 750 mg via INTRAVENOUS
  Filled 2013-10-13 (×3): qty 150

## 2013-10-13 MED ORDER — IBUPROFEN 800 MG PO TABS
800.0000 mg | ORAL_TABLET | Freq: Once | ORAL | Status: AC
  Administered 2013-10-13: 800 mg via ORAL
  Filled 2013-10-13: qty 1

## 2013-10-13 MED ORDER — ALBUTEROL SULFATE (2.5 MG/3ML) 0.083% IN NEBU: 2.5000 mg | INHALATION_SOLUTION | RESPIRATORY_TRACT | Status: AC | PRN

## 2013-10-13 MED ORDER — IPRATROPIUM-ALBUTEROL 0.5-2.5 (3) MG/3ML IN SOLN: 3.0000 mL | RESPIRATORY_TRACT | Status: DC | PRN

## 2013-10-13 MED ORDER — SODIUM CHLORIDE 0.9 % IV SOLN
INTRAVENOUS | Status: DC
  Administered 2013-10-13: 1000 mL via INTRAVENOUS
  Administered 2013-10-14 (×2): via INTRAVENOUS

## 2013-10-13 MED ORDER — TIOTROPIUM BROMIDE MONOHYDRATE 18 MCG IN CAPS
18.0000 ug | ORAL_CAPSULE | Freq: Every day | RESPIRATORY_TRACT | Status: DC
  Filled 2013-10-13: qty 5

## 2013-10-13 MED ORDER — ASPIRIN 81 MG PO CHEW
81.0000 mg | CHEWABLE_TABLET | Freq: Every day | ORAL | Status: DC
  Administered 2013-10-13 – 2013-10-15 (×3): 81 mg via ORAL
  Filled 2013-10-13 (×3): qty 1

## 2013-10-13 MED ORDER — ACETAMINOPHEN 650 MG RE SUPP: 650.0000 mg | Freq: Four times a day (QID) | RECTAL | Status: DC | PRN

## 2013-10-13 MED ORDER — METHYLPREDNISOLONE SODIUM SUCC 125 MG IJ SOLR
125.0000 mg | Freq: Once | INTRAMUSCULAR | Status: AC
  Administered 2013-10-13: 125 mg via INTRAVENOUS
  Filled 2013-10-13: qty 2

## 2013-10-13 MED ORDER — IPRATROPIUM-ALBUTEROL 0.5-2.5 (3) MG/3ML IN SOLN
3.0000 mL | Freq: Once | RESPIRATORY_TRACT | Status: AC
  Administered 2013-10-13: 3 mL via RESPIRATORY_TRACT
  Filled 2013-10-13: qty 3

## 2013-10-13 MED ORDER — NICOTINE 21 MG/24HR TD PT24
21.0000 mg | MEDICATED_PATCH | Freq: Every day | TRANSDERMAL | Status: DC
  Administered 2013-10-13 – 2013-10-15 (×3): 21 mg via TRANSDERMAL
  Filled 2013-10-13 (×3): qty 1

## 2013-10-13 MED ORDER — NICOTINE 14 MG/24HR TD PT24
14.0000 mg | MEDICATED_PATCH | Freq: Once | TRANSDERMAL | Status: DC
  Filled 2013-10-13: qty 1

## 2013-10-13 MED ORDER — SODIUM CHLORIDE 0.9 % IV BOLUS (SEPSIS)
1000.0000 mL | Freq: Once | INTRAVENOUS | Status: AC
  Administered 2013-10-13: 1000 mL via INTRAVENOUS

## 2013-10-13 MED ORDER — ACETAMINOPHEN 325 MG PO TABS
650.0000 mg | ORAL_TABLET | Freq: Four times a day (QID) | ORAL | Status: DC | PRN
  Administered 2013-10-14: 650 mg via ORAL
  Filled 2013-10-13: qty 2

## 2013-10-13 MED ORDER — ATORVASTATIN CALCIUM 20 MG PO TABS
20.0000 mg | ORAL_TABLET | Freq: Every day | ORAL | Status: DC
  Administered 2013-10-14 – 2013-10-15 (×2): 20 mg via ORAL
  Filled 2013-10-13 (×2): qty 1

## 2013-10-13 MED ORDER — ENOXAPARIN SODIUM 40 MG/0.4ML ~~LOC~~ SOLN
40.0000 mg | SUBCUTANEOUS | Status: DC
  Administered 2013-10-13 – 2013-10-14 (×2): 40 mg via SUBCUTANEOUS
  Filled 2013-10-13 (×3): qty 0.4

## 2013-10-13 MED ORDER — DM-GUAIFENESIN ER 30-600 MG PO TB12
1.0000 | ORAL_TABLET | Freq: Two times a day (BID) | ORAL | Status: DC
  Administered 2013-10-13 – 2013-10-15 (×4): 1 via ORAL
  Filled 2013-10-13 (×5): qty 1

## 2013-10-13 MED ORDER — IPRATROPIUM-ALBUTEROL 0.5-2.5 (3) MG/3ML IN SOLN
3.0000 mL | RESPIRATORY_TRACT | Status: DC
  Administered 2013-10-13 – 2013-10-14 (×5): 3 mL via RESPIRATORY_TRACT
  Filled 2013-10-13 (×5): qty 3

## 2013-10-13 MED ORDER — INFLUENZA VAC SPLIT QUAD 0.5 ML IM SUSY
0.5000 mL | PREFILLED_SYRINGE | INTRAMUSCULAR | Status: DC
  Filled 2013-10-13: qty 0.5

## 2013-10-13 MED ORDER — METHYLPREDNISOLONE SODIUM SUCC 125 MG IJ SOLR
60.0000 mg | Freq: Four times a day (QID) | INTRAMUSCULAR | Status: DC
  Administered 2013-10-13 – 2013-10-15 (×7): 60 mg via INTRAVENOUS
  Filled 2013-10-13 (×11): qty 0.96

## 2013-10-13 NOTE — ED Notes (Signed)
Patient walked to restroom; sats dropped to 78% on room air. Reported feeling woozy. Returned to bed. Sats 84% on room air at rest. Placed back on 2L [place]. Notified PA, Jen.

## 2013-10-13 NOTE — ED Notes (Signed)
IV attempted x 2 by this RN; second RN to attempt 

## 2013-10-13 NOTE — ED Notes (Signed)
Ambulated pt on the unit pt saturations remained between 77-79% (room-air) pt complained of SOB after returning from restroom no other complaints noted at this time

## 2013-10-13 NOTE — Progress Notes (Addendum)
Report received from [place], [place].  Will await pt. Arrival to floor.  Forbes Cellar, RN

## 2013-10-13 NOTE — ED Notes (Signed)
Admitting MD at bedside.

## 2013-10-13 NOTE — ED Provider Notes (Signed)
Patient presented to the ER with fever, chills, and shortness of breath that has been present for 2 days.  Face to face Exam: HEENT - PERRLA Lungs - breath sounds diminished bilaterally, rhonchi at right base Heart - RRR, no M/R/G Abd - S/NT/ND Neuro - alert, oriented x3  Plan:  Patient with shortness of breath, fever, hypoxia and a history of COPD. Patient does not use oxygen at home. Workup for pneumonia, will require hospitalization.  Gilda Crease, MD 10/13/13 1022

## 2013-10-13 NOTE — ED Notes (Signed)
Family at bedside. 

## 2013-10-13 NOTE — ED Notes (Signed)
Pt. Stated, I have COPD and have been  SOB since Friday and had some fever and chills since Friday.

## 2013-10-13 NOTE — H&P (Addendum)
[facility] History and Physical  Mickaela Starlin SEG:[id number] DOB: July 07, 1955 DOA: 10/13/2013  Referring physician:  PCP: Doris Cheadle, MD  Specialists:    Chief Complaint: Fever, chills, cough, shortness of breath  HPI: Judith Scott is a 58 y.o. female  With a history of COPD, hypertension, tobacco abuse, presented to the emergency department with complaints of fever, chills, nonproductive cough and shortness of breath has been ongoing for the past 3-4 days. Patient stated that she tried using her nebulizer treatments at home as well as rescue inhalers without resolution of her symptoms or improvement. Patient states her leg down aggravates her symptoms, nothing seems to make her symptoms better. She didtate that both her granddaughter has an ear infection, her daughter has an upper respiratory infection.  Patient does not have oxygen at home. Patient does not see a pulmonologist. Patient did admit to being admitted previously for similar presentation. Upon admission, patient denied any chest pain, headache, abdominal pain, nausea, vomiting, diarrhea, constipation. Denied ny recent travel.  In the emergency department, patient was found to be hypoxic upon arrival. She was given several DuoNeb treatments as well as some Solu-Medrol.   Review of Systems:  Constitutional: Complains of fever and chills with decreased appetite HEENT: Denies photophobia, eye pain, redness, hearing loss, ear pain, congestion, sore throat, rhinorrhea, sneezing, mouth sores, trouble swallowing, neck pain, neck stiffness and tinnitus.   Respiratory: Complained of a nonproductive cough, shortness of breath Cardiovascular: Denies chest pain, palpitations and leg swelling.  Gastrointestinal: Denies nausea, vomiting, abdominal pain, diarrhea, constipation, blood in stool and abdominal distention.  Genitourinary: Denies dysuria, urgency, frequency, hematuria, flank pain and difficulty urinating.  Musculoskeletal:  Denies myalgias, back pain, joint swelling, arthralgias and gait problem.  Skin: Denies pallor, rash and wound.  Neurological: Denies dizziness, seizures, syncope, weakness, light-headedness, numbness and headaches.  Hematological: Denies adenopathy. Easy bruising, personal or family bleeding history  Psychiatric/Behavioral: Denies suicidal ideation, mood changes, confusion, nervousness, sleep disturbance and agitation  Past Medical History  Diagnosis Date  . COPD (chronic obstructive pulmonary disease)   . Hypercholesteremia   . COPD exacerbation 05/25/2012  . Hypertension    Past Surgical History  Procedure Laterality Date  . Appendectomy     Social History:  reports that she has been smoking Cigarettes.  She has a 40 pack-year smoking history. She does not have any smokeless tobacco history on file. She reports that she does not drink alcohol or use illicit drugs.   No Known Allergies  Family History  Problem Relation Age of Onset  . Cancer Mother   . Heart failure Father     Prior to Admission medications   Medication Sig Start Date End Date Taking? Authorizing Provider  albuterol (PROVENTIL HFA;VENTOLIN HFA) 108 (90 BASE) MCG/ACT inhaler Inhale 2 puffs into the lungs every 4 (four) hours as needed for wheezing or shortness of breath.   Yes Historical Provider, MD  albuterol (PROVENTIL) (5 MG/ML) 0.5% nebulizer solution Take 2.5 mg by nebulization every 6 (six) hours as needed for wheezing or shortness of breath.   Yes Historical Provider, MD  aspirin 81 MG chewable tablet Chew 81 mg by mouth daily.   Yes Historical Provider, MD  Aspirin-Acetaminophen-Caffeine (GOODY HEADACHE PO) Take 1 packet by mouth daily as needed.    Yes Historical Provider, MD  atorvastatin (LIPITOR) 20 MG tablet Take 20 mg by mouth daily.   Yes Historical Provider, MD  ipratropium-albuterol (DUONEB) 0.5-2.5 (3) MG/3ML SOLN Take 3 mLs by nebulization every 4 (  four) hours as needed (wheezing).   Yes  Historical Provider, MD  lisinopril-hydrochlorothiazide (PRINZIDE,ZESTORETIC) 10-12.5 MG per tablet Take 1 tablet by mouth daily.   Yes Historical Provider, MD  tiotropium (SPIRIVA) 18 MCG inhalation capsule Place 18 mcg into inhaler and inhale daily.   Yes Historical Provider, MD   Physical Exam: Filed Vitals:   10/13/13 1300  BP: 111/56  Pulse: 103  Temp:   Resp: 19     General: Well developed, well nourished, NAD, appears stated age  HEENT: NCAT, PERRLA, EOMI, Anicteic Sclera, mucous membranes dry   Cardiovascular: S1 S2 auscultated, no rubs, murmurs or gallops. Regular rate and rhythm.  Respiratory: Scattered expiratory wheezing, diminished breath sounds  Abdomen: Soft, nontender, nondistended, + bowel sounds  Extremities: warm dry without cyanosis clubbing or edema  Neuro: AAOx3, cranial nerves grossly intact. Strength 5/5 in patient's upper and lower extremities bilaterally  Psych: Normal affect and demeanor with intact judgement and insight  Labs on Admission:  Basic Metabolic Panel:  Recent Labs Lab 10/13/13 1012  NA 127*  K 4.1  CL 88*  CO2 24  GLUCOSE 109*  BUN 11  CREATININE 0.67  CALCIUM 9.3   Liver Function Tests:  Recent Labs Lab 10/13/13 1012  AST 23  ALT 12  ALKPHOS 72  BILITOT 0.3  PROT 7.9  ALBUMIN 3.9   No results found for this basename: LIPASE, AMYLASE,  in the last 168 hours No results found for this basename: AMMONIA,  in the last 168 hours CBC:  Recent Labs Lab 10/13/13 1012  WBC 7.0  NEUTROABS 5.5  HGB 14.5  HCT 41.0  MCV 90.3  PLT 246   Cardiac Enzymes:  Recent Labs Lab 10/13/13 1012  TROPONINI <0.30    BNP (last 3 results)  Recent Labs  10/13/13 1012  PROBNP 108.2   CBG: No results found for this basename: GLUCAP,  in the last 168 hours  Radiological Exams on Admission: Dg Chest Port 1 View  10/13/2013   CLINICAL DATA:  Two day history of cough, shortness of breath and fever. Smoker with current  history of COPD.  EXAM: PORTABLE CHEST - 1 VIEW  COMPARISON:  Two-view chest x-ray 07/10/2012, 06/09/2008, 07/31/2006.  FINDINGS: Cardiac silhouette normal in size, unchanged. Thoracic aorta mildly atherosclerotic, unchanged. Hilar and mediastinal contours otherwise unremarkable. Emphysematous changes in the upper lobes, right greater than left. Stable hyperinflation. Nodular opacity projected over the lateral right lung base, not clearly visualized on prior examinations. Lungs otherwise clear. No localized airspace consolidation. No pleural effusions. No pneumothorax. Normal pulmonary vascularity.  IMPRESSION: COPD/emphysema. Nodular opacity projected over the lateral right lung base, not clearly visualized on prior examinations. Repeat PA lateral chest x-ray may be helpful to confirm or deny this finding when the patient is clinically able.   Electronically Signed   By: Hulan Saas M.D.   On: 10/13/2013 12:06    EKG: Independently reviewed. Sinus tachycardia, rate 106, right axis deviation, no change from prior EKG  Assessment/Plan  Acute respiratory failure with hypoxia secondary to COPD exacerbation -Patient will be admitted to medical floor -Will continue supplemental oxygen to maintain her saturations above 92%, IV Levaquin, Solu-Medrol, DuoNeb treatments, guaifenesin -X-ray showed COPD/emphysema, nodular opacity in the lateral right lung base, recommended repeat PA lateral chest x-ray  COPD exacerbation secondary to continued tobacco abuse versus viral etiology -Patient counseled in smoking cessation -Patient has had recent sick contacts with upper respiratory infection -Treatment and plan of the above  Tobacco abuse -  Patient has been counseled regarding smoking cessation -Placed on nicotine patch  Nodular opacity found on x-ray -Repeat x-rays  Hyponatremia and hypochloremia -Likely secondary to dehydration and poor oral intake with continued use of diuretics -HCTZ held -Will  place on gentle IV fluid and continue to monitor BMP  Hypertension -Patient currently have soft blood pressures, will hold lisinopril/HCTZ  Hyperlipidemia -Continue statin  DVT prophylaxis Lovenox  Family Communication: Ex-Husband at bedside. Admission, patients condition and plan of care including tests being ordered have been discussed with the patient and family who indicate understanding and agree with the plan and Code Status.  Disposition Plan: Admitted   Time spent: 60 minutes  Kacie Huxtable D.O. [facility] Pager [phone]  If 7PM-7AM, please contact night-coverage [email or web address] Password [email or web address] 10/13/2013, 1:43 PM

## 2013-10-13 NOTE — ED Notes (Signed)
Patient is resting comfortably. 

## 2013-10-13 NOTE — ED Notes (Signed)
Pt c/o increased shortness of breath since Friday. Reports fever and chills at home. Recently visiting family member in the hospital. Pt does have oxygen at home to use as needed; states she hasn't needed to use. Pt is a smoker and has had a nonproductive cough. Crackles heard in bilateral lungs

## 2013-10-13 NOTE — ED Provider Notes (Signed)
CSN: [id number]     Arrival date & time 10/13/13  0935 History   First MD Initiated Contact with Patient 10/13/13 1007     Chief Complaint  Patient presents with  . Shortness of Breath  . Fever     (Consider location/radiation/quality/duration/timing/severity/associated sxs/prior Treatment) HPI Comments: Patient is a 58 year old female past medical history significant for COPD, HLD, HTN, tobacco abuse presenting to the emergency department for 3 days of subjective fever and chills, myalgias, nonproductive cough, wheezing, chest tightness. Patient states she has tried taking her nebulizer, rescue inhaler, tried using some "leftover oxygen they sent me home with from the hospital a while ago" without improvement of her symptoms. Patient states laying down at night and aggravate her symptoms. She endorses that her granddaughter and daughter are both sick with upper respiratory symptoms.  Patient is a 58 y.o. female presenting with shortness of breath and fever.  Shortness of Breath Associated symptoms: fever and wheezing   Fever Associated symptoms: chills     Past Medical History  Diagnosis Date  . COPD (chronic obstructive pulmonary disease)   . Hypercholesteremia   . COPD exacerbation 05/25/2012  . Hypertension    Past Surgical History  Procedure Laterality Date  . Appendectomy     Family History  Problem Relation Age of Onset  . Cancer Mother    History  Substance Use Topics  . Smoking status: Current Every Day Smoker -- 1.50 packs/day for 40 years    Types: Cigarettes  . Smokeless tobacco: Not on file  . Alcohol Use: No   OB History   Grav Para Term Preterm Abortions TAB SAB Ect Mult Living                 Review of Systems  Constitutional: Positive for fever, chills and fatigue.  Respiratory: Positive for chest tightness, shortness of breath and wheezing.   Cardiovascular: Negative for leg swelling.  All other systems reviewed and are negative.     Allergies   Review of patient's allergies indicates no known allergies.  Home Medications   Prior to Admission medications   Medication Sig Start Date End Date Taking? Authorizing Provider  albuterol (PROVENTIL HFA;VENTOLIN HFA) 108 (90 BASE) MCG/ACT inhaler Inhale 2 puffs into the lungs every 4 (four) hours as needed for wheezing or shortness of breath.   Yes Historical Provider, MD  albuterol (PROVENTIL) (5 MG/ML) 0.5% nebulizer solution Take 2.5 mg by nebulization every 6 (six) hours as needed for wheezing or shortness of breath.   Yes Historical Provider, MD  aspirin 81 MG chewable tablet Chew 81 mg by mouth daily.   Yes Historical Provider, MD  Aspirin-Acetaminophen-Caffeine (GOODY HEADACHE PO) Take 1 packet by mouth daily as needed.    Yes Historical Provider, MD  atorvastatin (LIPITOR) 20 MG tablet Take 20 mg by mouth daily.   Yes Historical Provider, MD  ipratropium-albuterol (DUONEB) 0.5-2.5 (3) MG/3ML SOLN Take 3 mLs by nebulization every 4 (four) hours as needed (wheezing).   Yes Historical Provider, MD  lisinopril-hydrochlorothiazide (PRINZIDE,ZESTORETIC) 10-12.5 MG per tablet Take 1 tablet by mouth daily.   Yes Historical Provider, MD  tiotropium (SPIRIVA) 18 MCG inhalation capsule Place 18 mcg into inhaler and inhale daily.   Yes Historical Provider, MD   BP 107/49  Pulse 92  Temp(Src) 98.4 F (36.9 C) (Oral)  Resp 18  Ht 5\' 4"  (1.626 m)  Wt 130 lb 8.2 oz (59.2 kg)  BMI 22.39 kg/m2  SpO2 93% Physical Exam  Nursing  note and vitals reviewed. Constitutional: She is oriented to person, place, and time. She appears well-developed and well-nourished. No distress.  HENT:  Head: Normocephalic and atraumatic.  Right Ear: External ear normal.  Left Ear: External ear normal.  Nose: Nose normal.  Mouth/Throat: Oropharynx is clear and moist. No oropharyngeal exudate.  Eyes: Conjunctivae are normal.  Neck: Normal range of motion. Neck supple.  Cardiovascular: Regular rhythm, normal heart  sounds and intact distal pulses.  Tachycardia present.   Pulmonary/Chest: No accessory muscle usage. Tachypnea noted. No respiratory distress. She has decreased breath sounds. She has rhonchi in the right lower field. She exhibits no tenderness.  Coughing on examination.   Abdominal: Soft. There is no tenderness.  Musculoskeletal: Normal range of motion. She exhibits no edema.  Lymphadenopathy:    She has no cervical adenopathy.  Neurological: She is alert and oriented to person, place, and time.  Skin: Skin is warm and dry. She is not diaphoretic.  Psychiatric: She has a normal mood and affect.    ED Course  Procedures (including critical care time) Medications  albuterol (PROVENTIL) (2.5 MG/3ML) 0.083% nebulizer solution 2.5 mg (not administered)  nicotine (NICODERM CQ - dosed in mg/24 hours) patch 14 mg (not administered)  atorvastatin (LIPITOR) tablet 20 mg (not administered)  aspirin chewable tablet 81 mg (not administered)  enoxaparin (LOVENOX) injection 40 mg (40 mg Subcutaneous Given 10/13/13 1506)  0.9 %  sodium chloride infusion (1,000 mLs Intravenous New Bag/Given 10/13/13 1456)  acetaminophen (TYLENOL) tablet 650 mg (not administered)    Or  acetaminophen (TYLENOL) suppository 650 mg (not administered)  ipratropium-albuterol (DUONEB) 0.5-2.5 (3) MG/3ML nebulizer solution 3 mL (not administered)  ipratropium-albuterol (DUONEB) 0.5-2.5 (3) MG/3ML nebulizer solution 3 mL (not administered)  methylPREDNISolone sodium succinate (SOLU-MEDROL) 125 mg/2 mL injection 60 mg (not administered)  levofloxacin (LEVAQUIN) IVPB 750 mg (750 mg Intravenous Given 10/13/13 1458)  dextromethorphan-guaiFENesin (MUCINEX DM) 30-600 MG per 12 hr tablet 1 tablet (not administered)  nicotine (NICODERM CQ - dosed in mg/24 hours) patch 21 mg (21 mg Transdermal Patch Applied 10/13/13 1504)  Influenza vac split quadrivalent PF (FLUARIX) injection 0.5 mL (not administered)  ibuprofen (ADVIL,MOTRIN) tablet  800 mg (800 mg Oral Given 10/13/13 1050)  methylPREDNISolone sodium succinate (SOLU-MEDROL) 125 mg/2 mL injection 125 mg (125 mg Intravenous Given 10/13/13 1102)  ipratropium-albuterol (DUONEB) 0.5-2.5 (3) MG/3ML nebulizer solution 3 mL (3 mLs Nebulization Given 10/13/13 1050)  sodium chloride 0.9 % bolus 1,000 mL (0 mLs Intravenous Stopped 10/13/13 1250)  ipratropium-albuterol (DUONEB) 0.5-2.5 (3) MG/3ML nebulizer solution 3 mL (3 mLs Nebulization Given 10/13/13 1151)  ipratropium-albuterol (DUONEB) 0.5-2.5 (3) MG/3ML nebulizer solution 3 mL (3 mLs Nebulization Given 10/13/13 1235)    Labs Review Labs Reviewed  CBC WITH DIFFERENTIAL - Abnormal; Notable for the following:    Neutrophils Relative % 79 (*)    Lymphocytes Relative 10 (*)    All other components within normal limits  COMPREHENSIVE METABOLIC PANEL - Abnormal; Notable for the following:    Sodium 127 (*)    Chloride 88 (*)    Glucose, Bld 109 (*)    All other components within normal limits  I-STAT ARTERIAL BLOOD GAS, ED - Abnormal; Notable for the following:    pO2, Arterial 66.0 (*)    Bicarbonate 25.6 (*)    All other components within normal limits  URINE CULTURE  PRO B NATRIURETIC PEPTIDE  TROPONIN I  URINALYSIS, ROUTINE W REFLEX MICROSCOPIC  I-STAT CG4 LACTIC ACID, ED  I-STAT CG4 LACTIC  ACID, ED    Imaging Review Dg Chest Port 1 View  10/13/2013   CLINICAL DATA:  Two day history of cough, shortness of breath and fever. Smoker with current history of COPD.  EXAM: PORTABLE CHEST - 1 VIEW  COMPARISON:  Two-view chest x-ray 07/10/2012, 06/09/2008, 07/31/2006.  FINDINGS: Cardiac silhouette normal in size, unchanged. Thoracic aorta mildly atherosclerotic, unchanged. Hilar and mediastinal contours otherwise unremarkable. Emphysematous changes in the upper lobes, right greater than left. Stable hyperinflation. Nodular opacity projected over the lateral right lung base, not clearly visualized on prior examinations. Lungs  otherwise clear. No localized airspace consolidation. No pleural effusions. No pneumothorax. Normal pulmonary vascularity.  IMPRESSION: COPD/emphysema. Nodular opacity projected over the lateral right lung base, not clearly visualized on prior examinations. Repeat PA lateral chest x-ray may be helpful to confirm or deny this finding when the patient is clinically able.   Electronically Signed   By: Hulan Saas M.D.   On: 10/13/2013 12:06     EKG Interpretation   Date/Time:  Sunday October 13 2013 09:44:21 EDT Ventricular Rate:  106 PR Interval:  120 QRS Duration: 74 QT Interval:  310 QTC Calculation: 411 R Axis:   109 Text Interpretation:  Sinus tachycardia Right atrial enlargement Rightward  axis Low voltage QRS Cannot rule out Anterior infarct , age undetermined  Abnormal ECG No significant change since last tracing Confirmed by POLLINA   MD, CHRISTOPHER [phone]) on 10/13/2013 10:13:43 AM      MDM   Final diagnoses:  COPD exacerbation  Hypoxia    I have reviewed nursing notes, vital signs, and all appropriate lab and imaging results for this patient.  Patient with COPD exacerbation and hypoxia. On arrival patient with oxygen saturations of 84-86% on RA, responded to 2L [place] once placed in examination room. Patient initially with diminished breath sounds, improved with three duonebs and IV Solumedrol. CXR reviewed. No evidence of PNA. Patient unable to ambulate without dropping oxygen saturation to 80% on RA. Patient will be admitted to [facility]. Dr. Catha Gosselin was consulted and will admit the patient. Patient d/w with Dr. Blinda Leatherwood, agrees with plan.    Jeannetta Ellis, PA-C 10/13/13 1534

## 2013-10-13 NOTE — Progress Notes (Signed)
Utilization review completed.  

## 2013-10-13 NOTE — Progress Notes (Signed)
Judith Scott [id number] Admitted to 5W 31: 10/13/2013 3:22 PM Attending Provider: Edsel Petrin, DO    Judith Scott is a 58 y.o. female patient admitted from ED awake, alert  & orientated  X 3,  Full Code, VSS - Blood pressure 107/49, pulse 92, temperature 98.4 F (36.9 C), temperature source Oral, resp. rate 18, height 5\' 4"  (1.626 m), weight 59.2 kg (130 lb 8.2 oz), SpO2 93.00%., O2  3 L nasal cannular, no c/o shortness of breath, no c/o chest pain, no distress noted.  Non-tele .   IV site WDL:  wrist right, condition patent and no redness with a transparent dsg that's clean dry and intact.  Allergies:  No Known Allergies   Past Medical History  Diagnosis Date  . COPD (chronic obstructive pulmonary disease)   . Hypercholesteremia   . COPD exacerbation 05/25/2012  . Hypertension     History:  obtained from the patient.  Pt orientation to unit, room and routine. Information packet given to patient/family and safety video watched.  Admission INP armband ID verified with patient/family, and in place. SR up x 2, fall risk assessment complete with Patient and family verbalizing understanding of risks associated with falls. Pt verbalizes an understanding of how to use the call bell and to call for help before getting out of bed.  Skin, clean-dry- intact without evidence of bruising, or skin tears.   No evidence of skin break down noted on exam.    Will cont to monitor and assist as needed.  05/27/2012, RN 10/13/2013 3:22 PM

## 2013-10-14 ENCOUNTER — Inpatient Hospital Stay (HOSPITAL_COMMUNITY): Payer: Medicaid Other

## 2013-10-14 DIAGNOSIS — E785 Hyperlipidemia, unspecified: Secondary | ICD-10-CM

## 2013-10-14 DIAGNOSIS — E871 Hypo-osmolality and hyponatremia: Secondary | ICD-10-CM

## 2013-10-14 DIAGNOSIS — E860 Dehydration: Secondary | ICD-10-CM

## 2013-10-14 LAB — BASIC METABOLIC PANEL
ANION GAP: 14 (ref 5–15)
BUN: 11 mg/dL (ref 6–23)
CO2: 24 meq/L (ref 19–32)
Calcium: 8.6 mg/dL (ref 8.4–10.5)
Chloride: 95 mEq/L — ABNORMAL LOW (ref 96–112)
Creatinine, Ser: 0.7 mg/dL (ref 0.50–1.10)
GFR calc Af Amer: >90 mL/min (ref >90)
GFR calc non Af Amer: >90 mL/min (ref >90)
GLUCOSE: 131 mg/dL — AB (ref 70–99)
Potassium: 4.4 mEq/L (ref 3.7–5.3)
SODIUM: 133 meq/L — AB (ref 137–147)

## 2013-10-14 LAB — CBC
HCT: 39.2 % (ref 36.0–46.0)
HEMOGLOBIN: 13.2 g/dL (ref 12.0–15.0)
MCH: 30.6 pg (ref 26.0–34.0)
MCHC: 33.7 g/dL (ref 30.0–36.0)
MCV: 91 fL (ref 78.0–100.0)
Platelets: 262 10*3/uL (ref 150–400)
RBC: 4.31 MIL/uL (ref 3.87–5.11)
RDW: 12.7 % (ref 11.5–15.5)
WBC: 3.9 10*3/uL — ABNORMAL LOW (ref 4.0–10.5)

## 2013-10-14 MED ORDER — ENSURE COMPLETE PO LIQD
237.0000 mL | Freq: Two times a day (BID) | ORAL | Status: DC
  Administered 2013-10-14: 237 mL via ORAL

## 2013-10-14 MED ORDER — IPRATROPIUM-ALBUTEROL 0.5-2.5 (3) MG/3ML IN SOLN
3.0000 mL | Freq: Four times a day (QID) | RESPIRATORY_TRACT | Status: DC
  Administered 2013-10-14 – 2013-10-15 (×3): 3 mL via RESPIRATORY_TRACT
  Filled 2013-10-14 (×4): qty 3

## 2013-10-14 NOTE — Progress Notes (Signed)
[facility]                                                                              Patient Demographics  Judith Scott, is a 58 y.o. female, DOB - Nov 20, 1955, KJI:[id number]  Admit date - 10/13/2013   Admitting Physician Edsel Petrin, DO  Outpatient Primary MD for the patient is Doris Cheadle, MD  LOS - 1   Chief Complaint  Patient presents with  . Shortness of Breath  . Fever      HPI on 10/13/2013 Judith Scott is a 58 y.o. female with a history of COPD, hypertension, tobacco abuse, presented to the emergency department with complaints of fever, chills, nonproductive cough and shortness of breath has been ongoing for the past 3-4 days. Patient stated that she tried using her nebulizer treatments at home as well as rescue inhalers without resolution of her symptoms or improvement. Patient stated laying down aggravates her symptoms, nothing seems to make her symptoms better. She admitted that her granddaughter has an ear infection, her daughter has an upper respiratory infection. Patient does not have oxygen at home. Patient does not see a pulmonologist. Patient did admit to being admitted previously for similar presentation. Upon admission, patient denied any chest pain, headache, abdominal pain, nausea, vomiting, diarrhea, constipation. Denied ny recent travel.  In the emergency department, patient was found to be hypoxic upon arrival. She was given several DuoNeb treatments as well as some Solu-Medrol.    Assessment & Plan   Acute respiratory failure with hypoxia secondary to COPD exacerbation  -Slight improvement -Will continue supplemental oxygen to maintain her saturations above 92%, IV Levaquin, Solu-Medrol, DuoNeb treatments, guaifenesin  -X-ray showed COPD/emphysema, nodular opacity in the lateral right lung base, recommended repeat PA lateral chest x-ray    COPD exacerbation secondary to continued tobacco abuse versus viral etiology  -Patient counseled in  smoking cessation  -Patient has had recent sick contacts with upper respiratory infection  -Treatment and plan of the above   Tobacco abuse  -Patient has been counseled regarding smoking cessation  -Placed on nicotine patch   Nodular opacity found on x-ray  -Repeat CXR: Persistent small 58mm RLL nodule (new since 2014), recommended outpatient CT  Hyponatremia and hypochloremia  -Improving with IVF -Likely secondary to dehydration and poor oral intake with continued use of diuretics  -HCTZ held  -Continue to monitor BMP  Hypertension  -Patient currently have soft blood pressures, will hold lisinopril/HCTZ   Hyperlipidemia  -Continue statin   Code Status: Full  Family Communication: None at bedside  Disposition Plan: Admitted, possible discharge 10/13  Time Spent in minutes   30 minutes  Procedures  None  Consults   none  DVT Prophylaxis  lovenox  Lab Results  Component Value Date   PLT 262 10/14/2013    Medications  Scheduled Meds: . aspirin  81 mg Oral Daily  . atorvastatin  20 mg Oral Daily  . dextromethorphan-guaiFENesin  1 tablet Oral BID  . enoxaparin (LOVENOX) injection  40 mg Subcutaneous Q24H  . Influenza vac split quadrivalent PF  0.5 mL Intramuscular Tomorrow-1000  . ipratropium-albuterol  3 mL Nebulization Q4H  . levofloxacin (LEVAQUIN) IV  750 mg  Intravenous Q24H  . methylPREDNISolone (SOLU-MEDROL) injection  60 mg Intravenous Q6H  . nicotine  14 mg Transdermal Once  . nicotine  21 mg Transdermal Daily   Continuous Infusions: . sodium chloride 75 mL/hr at 10/14/13 0546   PRN Meds:.acetaminophen, acetaminophen, ipratropium-albuterol  Antibiotics    Anti-infectives   Start     Dose/Rate Route Frequency Ordered Stop   10/13/13 1500  levofloxacin (LEVAQUIN) IVPB 750 mg     750 mg 100 mL/hr over 90 Minutes Intravenous Every 24 hours 10/13/13 1409          Subjective:   Judith Scott seen and examined today.  Patient states she slept  well overnight.  She states she still feels short winded with walking or moving around.  She denies cough, chest pain, headache, dizziness, abdominal pain.  Objective:   Filed Vitals:   10/13/13 2352 10/14/13 0521 10/14/13 0824 10/14/13 0909  BP:  113/68    Pulse:  84    Temp:  97.8 F (36.6 C)    TempSrc:  Oral    Resp:  20    Height:      Weight:      SpO2: 95% 93% 93% 87%    Wt Readings from Last 3 Encounters:  10/13/13 59.2 kg (130 lb 8.2 oz)  06/04/13 60.782 kg (134 lb)  05/15/13 60.963 kg (134 lb 6.4 oz)     Intake/Output Summary (Last 24 hours) at 10/14/13 1132 Last data filed at 10/13/13 1834  Gross per 24 hour  Intake 1272.5 ml  Output      0 ml  Net 1272.5 ml    Exam  General: Well developed, well nourished, NAD, appears stated age  HEENT: NCAT, mucous membranes moist.   Cardiovascular: S1 S2 auscultated, no rubs, murmurs or gallops. Regular rate and rhythm.  Respiratory: Diminished but essentially clear.  Upper airway exp wheezing.   Abdomen: Soft, nontender, nondistended, + bowel sounds  Extremities: warm dry without cyanosis clubbing or edema  Neuro: AAOx3, no focal deficits  Psych: Normal affect and demeanor with intact judgement and insight  Data Review   Micro Results No results found for this or any previous visit (from the past 240 hour(s)).  Radiology Reports Dg Chest 2 View  10/14/2013   CLINICAL DATA:  58 year old female with acute shortness of breath. Chronic obstructed pulmonary disease exacerbation. New right lower lung nodule on recent radiographs. Initial encounter.  EXAM: CHEST  2 VIEW  COMPARISON:  10/13/2013 and earlier.  FINDINGS: There is a persistent indistinct 8 mm nodular density projecting along the course of the right lateral tenth rib, new since 2014.  Underlying hyperinflation with progressed basilar predominant coarse pulmonary opacity bilaterally. Normal cardiac size and mediastinal contours. Visualized tracheal air  column is within normal limits. No pneumothorax or pleural effusion. No acute osseous abnormality identified.  IMPRESSION: 1. Persistent small 8 mm right lower lung nodule, radiographically new since 2014. Recommend followup chest CT (e.g. as outpatient) to characterize and establish a CT baseline. 2. Suspect progression of chronic pulmonary interstitial changes since 2014. Chronic hyperinflation.   Electronically Signed   By: Augusto Gamble M.D.   On: 10/14/2013 07:48   Dg Chest Port 1 View  10/13/2013   CLINICAL DATA:  Two day history of cough, shortness of breath and fever. Smoker with current history of COPD.  EXAM: PORTABLE CHEST - 1 VIEW  COMPARISON:  Two-view chest x-ray 07/10/2012, 06/09/2008, 07/31/2006.  FINDINGS: Cardiac silhouette normal in size, unchanged. Thoracic  aorta mildly atherosclerotic, unchanged. Hilar and mediastinal contours otherwise unremarkable. Emphysematous changes in the upper lobes, right greater than left. Stable hyperinflation. Nodular opacity projected over the lateral right lung base, not clearly visualized on prior examinations. Lungs otherwise clear. No localized airspace consolidation. No pleural effusions. No pneumothorax. Normal pulmonary vascularity.  IMPRESSION: COPD/emphysema. Nodular opacity projected over the lateral right lung base, not clearly visualized on prior examinations. Repeat PA lateral chest x-ray may be helpful to confirm or deny this finding when the patient is clinically able.   Electronically Signed   By: Hulan Saas M.D.   On: 10/13/2013 12:06    CBC  Recent Labs Lab 10/13/13 1012 10/14/13 0516  WBC 7.0 3.9*  HGB 14.5 13.2  HCT 41.0 39.2  PLT 246 262  MCV 90.3 91.0  MCH 31.9 30.6  MCHC 35.4 33.7  RDW 12.8 12.7  LYMPHSABS 0.7  --   MONOABS 0.8  --   EOSABS 0.0  --   BASOSABS 0.0  --     Chemistries   Recent Labs Lab 10/13/13 1012 10/14/13 0516  NA 127* 133*  K 4.1 4.4  CL 88* 95*  CO2 24 24  GLUCOSE 109* 131*  BUN 11 11   CREATININE 0.67 0.70  CALCIUM 9.3 8.6  AST 23  --   ALT 12  --   ALKPHOS 72  --   BILITOT 0.3  --    ------------------------------------------------------------------------------------------------------------------ estimated creatinine clearance is 64.6 ml/min (by C-G formula based on Cr of 0.7). ------------------------------------------------------------------------------------------------------------------ No results found for this basename: HGBA1C,  in the last 72 hours ------------------------------------------------------------------------------------------------------------------ No results found for this basename: CHOL, HDL, LDLCALC, TRIG, CHOLHDL, LDLDIRECT,  in the last 72 hours ------------------------------------------------------------------------------------------------------------------ No results found for this basename: TSH, T4TOTAL, FREET3, T3FREE, THYROIDAB,  in the last 72 hours ------------------------------------------------------------------------------------------------------------------ No results found for this basename: VITAMINB12, FOLATE, FERRITIN, TIBC, IRON, RETICCTPCT,  in the last 72 hours  Coagulation profile No results found for this basename: INR, PROTIME,  in the last 168 hours  No results found for this basename: DDIMER,  in the last 72 hours  Cardiac Enzymes  Recent Labs Lab 10/13/13 1012  TROPONINI <0.30   ------------------------------------------------------------------------------------------------------------------ No components found with this basename: POCBNP,     Judith Scott D.O. on 10/14/2013 at 11:32 AM  Between 7am to 7pm - Pager - [phone]  After 7pm go to [email or web address] - password [email or web address]  And look for the night coverage person covering for me after hours  [facility] Office  [phone]

## 2013-10-14 NOTE — Progress Notes (Signed)
CARE MANAGEMENT NOTE 10/14/2013  Patient:  Judith Scott, Judith Scott   Account Number:  [id number]  Date Initiated:  10/14/2013  Documentation initiated by:  [facility]  Subjective/Objective Assessment:   COPD exacerbation     Action/Plan:   husband assist her at home as needed.  oxygen with [facility]   Anticipated DC Date:  10/15/2013   Anticipated DC Plan:  HOME/SELF CARE      DC Planning Services  CM consult      Choice offered to / List presented to:             Status of service:  Completed, signed off Medicare Important Message given?  NO (If response is "NO", the following Medicare IM given date fields will be blank) Date Medicare IM given:   Medicare IM given by:   Date Additional Medicare IM given:   Additional Medicare IM given by:    Discharge Disposition:  HOME/SELF CARE  Per UR Regulation:    If discussed at Long Length of Stay Meetings, dates discussed:    Comments:  10/14/2013 1045 NCM spoke to pt and states she has oxygen at home. Explained she will need to bring her portable tank at dc. Pt states she goes to [facility] and [employer]. Explained she will need a follow up appt post dc from hospital. NCM will arrange appt closer to dc date. Has nebulizer machine at home. Her aunt, Charleston Poot lives in home. Isidoro Donning RN CCM Case Mgmt phone [phone]

## 2013-10-14 NOTE — Progress Notes (Signed)
INITIAL NUTRITION ASSESSMENT  DOCUMENTATION CODES Per approved criteria  -Not Applicable   INTERVENTION: Ensure Complete po BID, each supplement provides 350 kcal and 13 grams of protein  NUTRITION DIAGNOSIS: Inadequate oral intake related to decreased appetite as evidenced by diet hx, poor po intake x 1 month.   Goal: Pt will meet >90% of estimated nutritional needs  Monitor:  PO/supplement intake, labs, weight changes, I/O's  Reason for Assessment: MST=4, consult to assess nutritional needs  58 y.o. female  Admitting Dx: Acute respiratory failure with hypoxia  Judith Scott is a 58 y.o. female with a history of COPD, hypertension, tobacco abuse, presented to the emergency department with complaints of fever, chills, nonproductive cough and shortness of breath has been ongoing for the past 3-4 days. Patient stated that she tried using her nebulizer treatments at home as well as rescue inhalers without resolution of her symptoms or improvement.   ASSESSMENT: Pt confirms poor appetite over the past month. She denies difficulty chewing and swallowing difficulty. She reports that she has a lack of desire to eat, which she attributes to illness. She suspects that she has lost weight due to her poor po intake, however, was surprised that she has maintained her weight. Reveals UBW of 130-135#.  Poor PO has continued during hospitalization; pt reports that she only ate a few bites of breakfast this AM. Suspect changes in appetite may have a psychosocial component. Pt shared with this RD that her daughter had a stroke approximately 6 months ago and had rehabilitation at [facility]. She has had increased responsibilties with her granddaughter due to her her daughter's decline in health. Diet recall consists of: Breakfast: cereal OR bacon and eggs; Lunch: skip; Dinner: meat, starch, vegetable. Beverages are mainly iced tea (decafe with minimal sugar) and Coke. Pt reports that she has never  tried any nutritional supplements before, but is willing to try due to her decreased PO intake. Discussed importance of good PO intake to promote healing and improve nutritional status. Pt was very grateful for RD visit.  Labs reviewed. Na: 133, glucose: 131.   Nutrition Focused Physical Exam:  Subcutaneous Fat:  Orbital Region: WDL Upper Arm Region: mild depletion Thoracic and Lumbar Region: WDL  Muscle:  Temple Region: WDL Clavicle Bone Region: WDL Clavicle and Acromion Bone Region: WDL Scapular Bone Region: WDL Dorsal Hand: WDL Patellar Region: WDL Anterior Thigh Region: WDL Posterior Calf Region: WDL  Edema: none present  Height: Ht Readings from Last 1 Encounters:  10/13/13 5\' 4"  (1.626 m)    Weight: Wt Readings from Last 1 Encounters:  10/13/13 130 lb 8.2 oz (59.2 kg)    Ideal Body Weight: 120#  % Ideal Body Weight: 108%  Wt Readings from Last 10 Encounters:  10/13/13 130 lb 8.2 oz (59.2 kg)  06/04/13 134 lb (60.782 kg)  05/15/13 134 lb 6.4 oz (60.963 kg)  02/15/13 141 lb 9.6 oz (64.229 kg)  06/06/12 137 lb (62.143 kg)  05/25/12 138 lb 3.7 oz (62.7 kg)    Usual Body Weight: 130#  % Usual Body Weight: 100%  BMI:  Body mass index is 22.39 kg/(m^2). Normal weight range  Estimated Nutritional Needs: Kcal: 1500-1700 Protein: 71-81 grams Fluid: 1.5-1.7 L  Skin: WDL  Diet Order: Cardiac  EDUCATION NEEDS: -Education needs addressed   Intake/Output Summary (Last 24 hours) at 10/14/13 1020 Last data filed at 10/13/13 1834  Gross per 24 hour  Intake 1272.5 ml  Output      0 ml  Net 1272.5 ml    Last BM: 10/14/13  Labs:   Recent Labs Lab 10/13/13 1012 10/14/13 0516  NA 127* 133*  K 4.1 4.4  CL 88* 95*  CO2 24 24  BUN 11 11  CREATININE 0.67 0.70  CALCIUM 9.3 8.6  GLUCOSE 109* 131*    CBG (last 3)  No results found for this basename: GLUCAP,  in the last 72 hours  Scheduled Meds: . aspirin  81 mg Oral Daily  . atorvastatin  20 mg  Oral Daily  . dextromethorphan-guaiFENesin  1 tablet Oral BID  . enoxaparin (LOVENOX) injection  40 mg Subcutaneous Q24H  . Influenza vac split quadrivalent PF  0.5 mL Intramuscular Tomorrow-1000  . ipratropium-albuterol  3 mL Nebulization Q4H  . levofloxacin (LEVAQUIN) IV  750 mg Intravenous Q24H  . methylPREDNISolone (SOLU-MEDROL) injection  60 mg Intravenous Q6H  . nicotine  14 mg Transdermal Once  . nicotine  21 mg Transdermal Daily    Continuous Infusions: . sodium chloride 75 mL/hr at 10/14/13 0546    Past Medical History  Diagnosis Date  . COPD (chronic obstructive pulmonary disease)   . Hypercholesteremia   . COPD exacerbation 05/25/2012  . Hypertension     Past Surgical History  Procedure Laterality Date  . Appendectomy      Judith Scott A. Mayford Knife, RD, LDN Pager: [phone] After hours Pager: [phone]

## 2013-10-14 NOTE — Progress Notes (Signed)
SATURATION QUALIFICATIONS: (This note is used to comply with regulatory documentation for home oxygen)  Patient Saturations on Room Air at Rest = 87%  Patient Saturations on Room Air while Ambulating = 78%  Patient Saturations on 3l Liters of oxygen while Ambulating = 89%  Please briefly explain why patient needs home oxygen:

## 2013-10-15 LAB — CBC
HCT: 36.1 % (ref 36.0–46.0)
Hemoglobin: 12 g/dL (ref 12.0–15.0)
MCH: 30.7 pg (ref 26.0–34.0)
MCHC: 33.2 g/dL (ref 30.0–36.0)
MCV: 92.3 fL (ref 78.0–100.0)
PLATELETS: 273 10*3/uL (ref 150–400)
RBC: 3.91 MIL/uL (ref 3.87–5.11)
RDW: 12.9 % (ref 11.5–15.5)
WBC: 10.6 10*3/uL — ABNORMAL HIGH (ref 4.0–10.5)

## 2013-10-15 LAB — BASIC METABOLIC PANEL
ANION GAP: 12 (ref 5–15)
BUN: 12 mg/dL (ref 6–23)
CO2: 25 meq/L (ref 19–32)
CREATININE: 0.6 mg/dL (ref 0.50–1.10)
Calcium: 8.4 mg/dL (ref 8.4–10.5)
Chloride: 100 mEq/L (ref 96–112)
GFR calc Af Amer: >90 mL/min (ref >90)
GFR calc non Af Amer: >90 mL/min (ref >90)
Glucose, Bld: 134 mg/dL — ABNORMAL HIGH (ref 70–99)
POTASSIUM: 4.6 meq/L (ref 3.7–5.3)
Sodium: 137 mEq/L (ref 137–147)

## 2013-10-15 LAB — URINE CULTURE

## 2013-10-15 MED ORDER — PREDNISONE (PAK) 10 MG PO TABS: ORAL_TABLET | Freq: Every day | ORAL | Status: DC

## 2013-10-15 MED ORDER — METHYLPREDNISOLONE SODIUM SUCC 40 MG IJ SOLR
40.0000 mg | Freq: Two times a day (BID) | INTRAMUSCULAR | Status: DC
  Filled 2013-10-15 (×2): qty 1

## 2013-10-15 MED ORDER — NICOTINE 14 MG/24HR TD PT24: 14.0000 mg | MEDICATED_PATCH | Freq: Once | TRANSDERMAL | Status: DC

## 2013-10-15 MED ORDER — ENSURE COMPLETE PO LIQD: 237.0000 mL | Freq: Two times a day (BID) | ORAL | Status: DC

## 2013-10-15 MED ORDER — DM-GUAIFENESIN ER 30-600 MG PO TB12: 1.0000 | ORAL_TABLET | Freq: Two times a day (BID) | ORAL | Status: DC

## 2013-10-15 MED ORDER — LEVOFLOXACIN 750 MG PO TABS: 750.0000 mg | ORAL_TABLET | Freq: Every day | ORAL | Status: DC

## 2013-10-15 NOTE — Care Management Note (Signed)
    Page 1 of 2   10/15/2013     10:50:07 AM CARE MANAGEMENT NOTE 10/15/2013  Patient:  Judith Scott, Judith Scott   Account Number:  [id number]  Date Initiated:  10/14/2013  Documentation initiated by:  [facility]  Subjective/Objective Assessment:   COPD exacerbation  pt has oxygen with [facility] but only for nocturnal.     Action/Plan:   husband assist her at home as needed.  oxygen with [facility]   Anticipated DC Date:  10/15/2013   Anticipated DC Plan:  HOME/SELF CARE      DC Planning Services  CM consult      PAC Choice  DURABLE MEDICAL EQUIPMENT   Choice offered to / List presented to:  C-1 Patient   DME arranged  OXYGEN      DME agency  [facility]        Status of service:  Completed, signed off Medicare Important Message given?  NO (If response is "NO", the following Medicare IM given date fields will be blank) Date Medicare IM given:   Medicare IM given by:   Date Additional Medicare IM given:   Additional Medicare IM given by:    Discharge Disposition:  HOME/SELF CARE  Per UR Regulation:  Reviewed for med. necessity/level of care/duration of stay  If discussed at Long Length of Stay Meetings, dates discussed:    Comments:  10/15/13 1048 Letha Cape RN, BSN [phone] patient will need home oxygen continously per MD, sats in from yesterday, Patient states she has oxygen with [facility] now but it is only for prn use, now needs it continously. Patient would like to stay with [facility] .   NCM made referral to [facility] with [facility].  10/14/2013 1045 NCM spoke to pt and states she has oxygen at home. Explained she will need to bring her portable tank at dc. Pt states she goes to [facility] and [employer]. Explained she will need a follow up appt post dc from hospital. NCM will arrange appt closer to dc date. Has nebulizer machine at home. Her aunt, Charleston Poot lives in home. Isidoro Donning RN CCM Case Mgmt phone [phone]

## 2013-10-15 NOTE — Discharge Summary (Signed)
Physician Discharge Summary  Judith Scott ZHG:[id number] DOB: [birth year] DOA: 10/13/2013  PCP: Judith Cheadle, MD  Admit date: 10/13/2013 Discharge date: 10/15/2013  Time spent: 45 minutes  Recommendations for Outpatient Follow-up:  Patient will be discharged home. She will be given supplemental oxygen. Patient should follow up with her primary care physician within one week of discharge. Discussed x-ray findings with the patient, she is to have a CT scan done as an outpatient and she by her primary care physician. Patient medications as prescribed. She should continue on a heart healthy diet.  Discharge Diagnoses:  Acute respiratory failure with hypoxia secondary to COPD exacerbation  COPD exacerbation secondary to continued tobacco abuse which is by radiology Tobacco abuse Nodular opacity found on x-ray Hyponatremia and hypochloremia Hypertension Hyperlipidemia  Discharge Condition: Stable  Diet recommendation: Heart healthy  Filed Weights   10/13/13 1411  Weight: 59.2 kg (130 lb 8.2 oz)    History of present illness:  on 10/13/2013  Judith Scott is a 58 y.o. female with a history of COPD, hypertension, tobacco abuse, presented to the emergency department with complaints of fever, chills, nonproductive cough and shortness of breath has been ongoing for the past 3-4 days. Patient stated that she tried using her nebulizer treatments at home as well as rescue inhalers without resolution of her symptoms or improvement. Patient stated laying down aggravates her symptoms, nothing seems to make her symptoms better. She admitted that her granddaughter has an ear infection, her daughter has an upper respiratory infection. Patient does not have oxygen at home. Patient does not see a pulmonologist. Patient did admit to being admitted previously for similar presentation. Upon admission, patient denied any chest pain, headache, abdominal pain, nausea, vomiting, diarrhea, constipation. [place] travel.  In the emergency department, patient was found to be hypoxic upon arrival. She was given several DuoNeb treatments as well as some Solu-Medrol.   Hospital Course:  Acute respiratory failure with hypoxia secondary to COPD exacerbation  -Improving -X-ray showed COPD/emphysema, nodular opacity in the lateral right lung base, recommended repeat PA lateral chest x-ray  -Patient was tested at) found to be 87%, While ambulating 78%. -Patient discharge with Levaquin as well as prednisone taper guaifenesin and supplemental oxygen.  COPD exacerbation secondary to continued tobacco abuse versus viral etiology  -Patient counseled in smoking cessation  -Patient has had recent sick contacts with upper respiratory infection  -Treatment and plan of the above   Tobacco abuse  -Patient has been counseled regarding smoking cessation  -Placed on nicotine patch   Nodular opacity found on x-ray  -Repeat CXR: Persistent small 37mm RLL nodule (new since 2014), recommended outpatient CT  -discussed this with the patient, she understand the need for followup  Hyponatremia and hypochloremia  -Resolved with IVF  -Likely secondary to dehydration and poor oral intake with continued use of diuretics  -HCTZ held during hospitalization  Hypertension  -Patient currently have soft blood pressures, will hold lisinopril/HCTZ  -Patient should discuss her blood pressure medications with her primary care physician.  Hyperlipidemia  -Continue statin   Procedures:  None  Consultations:  None  Discharge Exam: Filed Vitals:   10/15/13 0534  BP: 104/69  Pulse: 76  Temp: 98.2 F (36.8 C)  Resp: 18   Exam  General: Well developed, well nourished, NAD, appears stated age  HEENT: NCAT, mucous membranes moist.  Cardiovascular: S1 S2 auscultated, no rubs, murmurs or gallops. Regular rate and rhythm.  Respiratory: Diminished but essentially clear.  Abdomen: Soft,  nontender, nondistended, + bowel  sounds  Extremities: warm dry without cyanosis clubbing or edema  Neuro: AAOx3, no focal deficits  Psych: Normal affect and demeanor with intact judgement and insight  Discharge Instructions      Discharge Instructions   Discharge instructions    Complete by:  As directed   Patient will be discharged home. She will be given supplemental oxygen. Patient should follow up with her primary care physician within one week of discharge. Discussed x-ray findings with the patient, she is to have a CT scan done as an outpatient and she by her primary care physician. Patient medications as prescribed. She should continue on a heart healthy diet.            Medication List    STOP taking these medications       lisinopril-hydrochlorothiazide 10-12.5 MG per tablet  Commonly known as:  PRINZIDE,ZESTORETIC      TAKE these medications       albuterol 108 (90 BASE) MCG/ACT inhaler  Commonly known as:  PROVENTIL HFA;VENTOLIN HFA  Inhale 2 puffs into the lungs every 4 (four) hours as needed for wheezing or shortness of breath.     albuterol (5 MG/ML) 0.5% nebulizer solution  Commonly known as:  PROVENTIL  Take 2.5 mg by nebulization every 6 (six) hours as needed for wheezing or shortness of breath.     aspirin 81 MG chewable tablet  Chew 81 mg by mouth daily.     atorvastatin 20 MG tablet  Commonly known as:  LIPITOR  Take 20 mg by mouth daily.     dextromethorphan-guaiFENesin 30-600 MG per 12 hr tablet  Commonly known as:  MUCINEX DM  Take 1 tablet by mouth 2 (two) times daily.     feeding supplement (ENSURE COMPLETE) Liqd  Take 237 mLs by mouth 2 (two) times daily between meals.     GOODY HEADACHE PO  Take 1 packet by mouth daily as needed.     ipratropium-albuterol 0.5-2.5 (3) MG/3ML Soln  Commonly known as:  DUONEB  Take 3 mLs by nebulization every 4 (four) hours as needed (wheezing).     levofloxacin 750 MG tablet  Commonly known as:  LEVAQUIN  Take 1 tablet (750 mg total)  by mouth daily.     nicotine 14 mg/24hr patch  Commonly known as:  NICODERM CQ - dosed in mg/24 hours  Place 1 patch (14 mg total) onto the skin once.     predniSONE 10 MG tablet  Commonly known as:  STERAPRED UNI-PAK  - Take by mouth daily. Prednisone dosing: Take  Prednisone 40mg  (4 tabs) x 3 days, then taper to 30mg  (3 tabs) x 3 days, then 20mg  (2 tabs) x 3days, then 10mg  (1 tab) x 3days, then OFF.  -   - Dispense:  30 tabs, refills: None     tiotropium 18 MCG inhalation capsule  Commonly known as:  SPIRIVA  Place 18 mcg into inhaler and inhale daily.       No Known Allergies Follow-up Information   Follow up with , MD. Schedule an appointment as soon as possible for a visit in 1 week. [facility] followup,discuss hypertension and obtain CT chest)    Specialty:  Internal Medicine   Contact information:   [address] [place] Judith Scott [facility] [phone]        The results of significant diagnostics from this hospitalization (including imaging, microbiology, ancillary and laboratory) are listed below for reference.  Significant Diagnostic Studies: Dg Chest 2 View  10/14/2013   CLINICAL DATA:  58 year old female with acute shortness of breath. Chronic obstructed pulmonary disease exacerbation. New right lower lung nodule on recent radiographs. Initial encounter.  EXAM: CHEST  2 VIEW  COMPARISON:  10/13/2013 and earlier.  FINDINGS: There is a persistent indistinct 8 mm nodular density projecting along the course of the right lateral tenth rib, new since 2014.  Underlying hyperinflation with progressed basilar predominant coarse pulmonary opacity bilaterally. Normal cardiac size and mediastinal contours. Visualized tracheal air column is within normal limits. No pneumothorax or pleural effusion. No acute osseous abnormality identified.  IMPRESSION: 1. Persistent small 8 mm right lower lung nodule, radiographically new since 2014. Recommend followup chest CT  (e.g. as outpatient) to characterize and establish a CT baseline. 2. Suspect progression of chronic pulmonary interstitial changes since 2014. Chronic hyperinflation.   Electronically Signed   By: Augusto Gamble M.D.   On: 10/14/2013 07:48   Dg Chest Port 1 View  10/13/2013   CLINICAL DATA:  Two day history of cough, shortness of breath and fever. Smoker with current history of COPD.  EXAM: PORTABLE CHEST - 1 VIEW  COMPARISON:  Two-view chest x-ray 07/10/2012, 06/09/2008, 07/31/2006.  FINDINGS: Cardiac silhouette normal in size, unchanged. Thoracic aorta mildly atherosclerotic, unchanged. Hilar and mediastinal contours otherwise unremarkable. Emphysematous changes in the upper lobes, right greater than left. Stable hyperinflation. Nodular opacity projected over the lateral right lung base, not clearly visualized on prior examinations. Lungs otherwise clear. No localized airspace consolidation. No pleural effusions. No pneumothorax. Normal pulmonary vascularity.  IMPRESSION: COPD/emphysema. Nodular opacity projected over the lateral right lung base, not clearly visualized on prior examinations. Repeat PA lateral chest x-ray may be helpful to confirm or deny this finding when the patient is clinically able.   Electronically Signed   By: Hulan Saas M.D.   On: 10/13/2013 12:06    Microbiology: No results found for this or any previous visit (from the past 240 hour(s)).   Labs: Basic Metabolic Panel:  Recent Labs Lab 10/13/13 1012 10/14/13 0516 10/15/13 0614  NA 127* 133* 137  K 4.1 4.4 4.6  CL 88* 95* 100  CO2 24 24 25   GLUCOSE 109* 131* 134*  BUN 11 11 12   CREATININE 0.67 0.70 0.60  CALCIUM 9.3 8.6 8.4   Liver Function Tests:  Recent Labs Lab 10/13/13 1012  AST 23  ALT 12  ALKPHOS 72  BILITOT 0.3  PROT 7.9  ALBUMIN 3.9   No results found for this basename: LIPASE, AMYLASE,  in the last 168 hours No results found for this basename: AMMONIA,  in the last 168 hours CBC:  Recent  Labs Lab 10/13/13 1012 10/14/13 0516 10/15/13 0614  WBC 7.0 3.9* 10.6*  NEUTROABS 5.5  --   --   HGB 14.5 13.2 12.0  HCT 41.0 39.2 36.1  MCV 90.3 91.0 92.3  PLT 246 262 273   Cardiac Enzymes:  Recent Labs Lab 10/13/13 1012  TROPONINI <0.30   BNP: BNP (last 3 results)  Recent Labs  10/13/13 1012  PROBNP 108.2   CBG: No results found for this basename: GLUCAP,  in the last 168 hours     Signed:  Edsel Petrin  [facility] 10/15/2013, 10:45 AM

## 2013-10-15 NOTE — Discharge Instructions (Signed)
Smoking Cessation Quitting smoking is important to your health and has many advantages. However, it is not always easy to quit since nicotine is a very addictive drug. Oftentimes, people try 3 times or more before being able to quit. This document explains the best ways for you to prepare to quit smoking. Quitting takes hard work and a lot of effort, but you can do it. ADVANTAGES OF QUITTING SMOKING  You will live longer, feel better, and live better.  Your body will feel the impact of quitting smoking almost immediately.  Within 20 minutes, blood pressure decreases. Your pulse returns to its normal level.  After 8 hours, carbon monoxide levels in the blood return to normal. Your oxygen level increases.  After 24 hours, the chance of having a heart attack starts to decrease. Your breath, hair, and body stop smelling like smoke.  After 48 hours, damaged nerve endings begin to recover. Your sense of taste and smell improve.  After 72 hours, the body is virtually free of nicotine. Your bronchial tubes relax and breathing becomes easier.  After 2 to 12 weeks, lungs can hold more air. Exercise becomes easier and circulation improves.  The risk of having a heart attack, stroke, cancer, or lung disease is greatly reduced.  After 1 year, the risk of coronary heart disease is cut in half.  After 5 years, the risk of stroke falls to the same as a nonsmoker.  After 10 years, the risk of lung cancer is cut in half and the risk of other cancers decreases significantly.  After 15 years, the risk of coronary heart disease drops, usually to the level of a nonsmoker.  If you are pregnant, quitting smoking will improve your chances of having a healthy baby.  The people you live with, especially any children, will be healthier.  You will have extra money to spend on things other than cigarettes. QUESTIONS TO THINK ABOUT BEFORE ATTEMPTING TO QUIT You may want to talk about your answers with your  health care provider.  Why do you want to quit?  If you tried to quit in the past, what helped and what did not?  What will be the most difficult situations for you after you quit? How will you plan to handle them?  Who can help you through the tough times? Your family? Friends? A health care provider?  What pleasures do you get from smoking? What ways can you still get pleasure if you quit? Here are some questions to ask your health care provider:  How can you help me to be successful at quitting?  What medicine do you think would be best for me and how should I take it?  What should I do if I need more help?  What is smoking withdrawal like? How can I get information on withdrawal? GET READY  Set a quit date.  Change your environment by getting rid of all cigarettes, ashtrays, matches, and lighters in your home, car, or work. Do not let people smoke in your home.  Review your past attempts to quit. Think about what worked and what did not. GET SUPPORT AND ENCOURAGEMENT You have a better chance of being successful if you have help. You can get support in many ways.  Tell your family, friends, and coworkers that you are going to quit and need their support. Ask them not to smoke around you.  Get individual, group, or telephone counseling and support. Programs are available at local hospitals and health centers. Call   your local health department for information about programs in your area.  Spiritual beliefs and practices may help some smokers quit.  Download a "quit meter" on your computer to keep track of quit statistics, such as how long you have gone without smoking, cigarettes not smoked, and money saved.  Get a self-help book about quitting smoking and staying off tobacco. LEARN NEW SKILLS AND BEHAVIORS  Distract yourself from urges to smoke. Talk to someone, go for a walk, or occupy your time with a task.  Change your normal routine. Take a different route to work.  Drink tea instead of coffee. Eat breakfast in a different place.  Reduce your stress. Take a hot bath, exercise, or read a book.  Plan something enjoyable to do every day. Reward yourself for not smoking.  Explore interactive web-based programs that specialize in helping you quit. GET MEDICINE AND USE IT CORRECTLY Medicines can help you stop smoking and decrease the urge to smoke. Combining medicine with the above behavioral methods and support can greatly increase your chances of successfully quitting smoking.  Nicotine replacement therapy helps deliver nicotine to your body without the negative effects and risks of smoking. Nicotine replacement therapy includes nicotine gum, lozenges, inhalers, nasal sprays, and skin patches. Some may be available over-the-counter and others require a prescription.  Antidepressant medicine helps people abstain from smoking, but how this works is unknown. This medicine is available by prescription.  Nicotinic receptor partial agonist medicine simulates the effect of nicotine in your brain. This medicine is available by prescription. Ask your health care provider for advice about which medicines to use and how to use them based on your health history. Your health care provider will tell you what side effects to look out for if you choose to be on a medicine or therapy. Carefully read the information on the package. Do not use any other product containing nicotine while using a nicotine replacement product.  RELAPSE OR DIFFICULT SITUATIONS Most relapses occur within the first 3 months after quitting. Do not be discouraged if you start smoking again. Remember, most people try several times before finally quitting. You may have symptoms of withdrawal because your body is used to nicotine. You may crave cigarettes, be irritable, feel very hungry, cough often, get headaches, or have difficulty concentrating. The withdrawal symptoms are only temporary. They are strongest  when you first quit, but they will go away within 10-14 days. To reduce the chances of relapse, try to:  Avoid drinking alcohol. Drinking lowers your chances of successfully quitting.  Reduce the amount of caffeine you consume. Once you quit smoking, the amount of caffeine in your body increases and can give you symptoms, such as a rapid heartbeat, sweating, and anxiety.  Avoid smokers because they can make you want to smoke.  Do not let weight gain distract you. Many smokers will gain weight when they quit, usually less than 10 pounds. Eat a healthy diet and stay active. You can always lose the weight gained after you quit.  Find ways to improve your mood other than smoking. FOR MORE INFORMATION  www.smokefree.gov  Document Released: 12/14/2000 Document Revised: 05/06/2013 Document Reviewed: 03/31/2011 Exit[employer] Patient Information 2015 [employer]. This information is not intended to replace advice given to you by your health care provider. Make sure you discuss any questions you have with your health care provider. Chronic Obstructive Pulmonary Disease Chronic obstructive pulmonary disease (COPD) is a common lung condition in which airflow from the lungs is limited.   COPD is a general term that can be used to describe many different lung problems that limit airflow, including both chronic bronchitis and emphysema. If you have COPD, your lung function will probably never return to normal, but there are measures you can take to improve lung function and make yourself feel better.  CAUSES   Smoking (common).   Exposure to secondhand smoke.   Genetic problems.  Chronic inflammatory lung diseases or recurrent infections. SYMPTOMS   Shortness of breath, especially with physical activity.   Deep, persistent (chronic) cough with a large amount of thick mucus.   Wheezing.   Rapid breaths (tachypnea).   Gray or bluish discoloration (cyanosis) of the skin, especially in fingers,  toes, or lips.   Fatigue.   Weight loss.   Frequent infections or episodes when breathing symptoms become much worse (exacerbations).   Chest tightness. DIAGNOSIS  Your health care provider will take a medical history and perform a physical examination to make the initial diagnosis. Additional tests for COPD may include:   Lung (pulmonary) function tests.  Chest X-ray.  CT scan.  Blood tests. TREATMENT  Treatment available to help you feel better when you have COPD includes:   Inhaler and nebulizer medicines. These help manage the symptoms of COPD and make your breathing more comfortable.  Supplemental oxygen. Supplemental oxygen is only helpful if you have a low oxygen level in your blood.   Exercise and physical activity. These are beneficial for nearly all people with COPD. Some people may also benefit from a pulmonary rehabilitation program. HOME CARE INSTRUCTIONS   Take all medicines (inhaled or pills) as directed by your health care provider.  Avoid over-the-counter medicines or cough syrups that dry up your airway (such as antihistamines) and slow down the elimination of secretions unless instructed otherwise by your health care provider.   If you are a smoker, the most important thing that you can do is stop smoking. Continuing to smoke will cause further lung damage and breathing trouble. Ask your health care provider for help with quitting smoking. He or she can direct you to community resources or hospitals that provide support.  Avoid exposure to irritants such as smoke, chemicals, and fumes that aggravate your breathing.  Use oxygen therapy and pulmonary rehabilitation if directed by your health care provider. If you require home oxygen therapy, ask your health care provider whether you should purchase a pulse oximeter to measure your oxygen level at home.   Avoid contact with individuals who have a contagious illness.  Avoid extreme temperature and  humidity changes.  Eat healthy foods. Eating smaller, more frequent meals and resting before meals may help you maintain your strength.  Stay active, but balance activity with periods of rest. Exercise and physical activity will help you maintain your ability to do things you want to do.  Preventing infection and hospitalization is very important when you have COPD. Make sure to receive all the vaccines your health care provider recommends, especially the pneumococcal and influenza vaccines. Ask your health care provider whether you need a pneumonia vaccine.  Learn and use relaxation techniques to manage stress.  Learn and use controlled breathing techniques as directed by your health care provider. Controlled breathing techniques include:   Pursed lip breathing. Start by breathing in (inhaling) through your nose for 1 second. Then, purse your lips as if you were going to whistle and breathe out (exhale) through the pursed lips for 2 seconds.   Diaphragmatic breathing. Start by putting   one hand on your abdomen just above your waist. Inhale slowly through your nose. The hand on your abdomen should move out. Then purse your lips and exhale slowly. You should be able to feel the hand on your abdomen moving in as you exhale.   Learn and use controlled coughing to clear mucus from your lungs. Controlled coughing is a series of short, progressive coughs. The steps of controlled coughing are:  1. Lean your head slightly forward.  2. Breathe in deeply using diaphragmatic breathing.  3. Try to hold your breath for 3 seconds.  4. Keep your mouth slightly open while coughing twice.  5. Spit any mucus out into a tissue.  6. Rest and repeat the steps once or twice as needed. SEEK MEDICAL CARE IF:   You are coughing up more mucus than usual.   There is a change in the color or thickness of your mucus.   Your breathing is more labored than usual.   Your breathing is faster than usual.   SEEK IMMEDIATE MEDICAL CARE IF:   You have shortness of breath while you are resting.   You have shortness of breath that prevents you from:  Being able to talk.   Performing your usual physical activities.   You have chest pain lasting longer than 5 minutes.   Your skin color is more cyanotic than usual.  You measure low oxygen saturations for longer than 5 minutes with a pulse oximeter. MAKE SURE YOU:   Understand these instructions.  Will watch your condition.  Will get help right away if you are not doing well or get worse. Document Released: 09/29/2004 Document Revised: 05/06/2013 Document Reviewed: 08/16/2012 ExitCare Patient Information 2015 [employer]. This information is not intended to replace advice given to you by your health care provider. Make sure you discuss any questions you have with your health care provider.  

## 2013-10-15 NOTE — Progress Notes (Signed)
DC home with family, verbally understood DC instructions, no questions  asked

## 2013-10-17 NOTE — ED Provider Notes (Signed)
Medical screening examination/treatment/procedure(s) were conducted as a shared visit with non-physician practitioner(s) and myself.  I personally evaluated the patient during the encounter.  Please see separate associated note for evaluation and plan.    EKG Interpretation   Date/Time:  Sunday October 13 2013 09:44:21 EDT Ventricular Rate:  106 PR Interval:  120 QRS Duration: 74 QT Interval:  310 QTC Calculation: 411 R Axis:   109 Text Interpretation:  Sinus tachycardia Right atrial enlargement Rightward  axis Low voltage QRS Cannot rule out Anterior infarct , age undetermined  Abnormal ECG No significant change since last tracing Confirmed by [facility] [place]   MD, CHRISTOPHER [phone]) on 10/13/2013 10:13:43 AM       Gilda Crease, MD 10/17/13 [phone]

## 2013-10-18 ENCOUNTER — Encounter: Payer: Self-pay | Admitting: Internal Medicine

## 2013-10-18 ENCOUNTER — Ambulatory Visit: Payer: Medicaid Other | Attending: Internal Medicine | Admitting: Internal Medicine

## 2013-10-18 ENCOUNTER — Ambulatory Visit: Payer: Medicaid Other | Admitting: Internal Medicine

## 2013-10-18 VITALS — BP 139/71 | HR 99 | Temp 99.3°F | Resp 14 | Ht 64.0 in | Wt 130.0 lb

## 2013-10-18 DIAGNOSIS — Z23 Encounter for immunization: Secondary | ICD-10-CM | POA: Diagnosis not present

## 2013-10-18 DIAGNOSIS — I10 Essential (primary) hypertension: Secondary | ICD-10-CM | POA: Diagnosis not present

## 2013-10-18 DIAGNOSIS — J441 Chronic obstructive pulmonary disease with (acute) exacerbation: Secondary | ICD-10-CM

## 2013-10-18 DIAGNOSIS — F17210 Nicotine dependence, cigarettes, uncomplicated: Secondary | ICD-10-CM | POA: Diagnosis not present

## 2013-10-18 DIAGNOSIS — E780 Pure hypercholesterolemia: Secondary | ICD-10-CM | POA: Diagnosis not present

## 2013-10-18 DIAGNOSIS — Z9981 Dependence on supplemental oxygen: Secondary | ICD-10-CM | POA: Diagnosis not present

## 2013-10-18 DIAGNOSIS — Z7951 Long term (current) use of inhaled steroids: Secondary | ICD-10-CM | POA: Insufficient documentation

## 2013-10-18 DIAGNOSIS — Z716 Tobacco abuse counseling: Secondary | ICD-10-CM | POA: Diagnosis not present

## 2013-10-18 DIAGNOSIS — Z720 Tobacco use: Secondary | ICD-10-CM | POA: Diagnosis not present

## 2013-10-18 DIAGNOSIS — R911 Solitary pulmonary nodule: Secondary | ICD-10-CM

## 2013-10-18 MED ORDER — LISINOPRIL 5 MG PO TABS: 5.0000 mg | ORAL_TABLET | Freq: Every day | ORAL | Status: DC

## 2013-10-18 MED ORDER — DM-GUAIFENESIN ER 30-600 MG PO TB12: 1.0000 | ORAL_TABLET | Freq: Two times a day (BID) | ORAL | Status: DC

## 2013-10-18 NOTE — Progress Notes (Signed)
Patient ID: Judith Scott, female   DOB: 12-04-1955, 58 y.o.   MRN: [id number]  CC: HFU   HPI:  Patient presents to clinic today for a hospital follow up for acute respiratory distress related to COPD exacerbation.  She reports that while inpatient she was found to have a 59mm lung nodule.  She was told that she needs a CT scan of the lungs for further evaluation.   She states that she is usually on 3 L of oxygen at home.  She reports that she is unsure when she needs the oxygen.  She reports that she has tried to cut back on smoking but has been having a hard time.  She states that she has not been taking her BP medication for the past three days because she was told in the ER that her blood pressure was too low to take the medication.  Coughing, fevers, chills, and chest discomfort have all resolved with Levaquin use.     No Known Allergies Past Medical History  Diagnosis Date  . COPD (chronic obstructive pulmonary disease)   . Hypercholesteremia   . COPD exacerbation 05/25/2012  . Hypertension    Current Outpatient Prescriptions on File Prior to Visit  Medication Sig Dispense Refill  . albuterol (PROVENTIL HFA;VENTOLIN HFA) 108 (90 BASE) MCG/ACT inhaler Inhale 2 puffs into the lungs every 4 (four) hours as needed for wheezing or shortness of breath.      Marland Kitchen albuterol (PROVENTIL) (5 MG/ML) 0.5% nebulizer solution Take 2.5 mg by nebulization every 6 (six) hours as needed for wheezing or shortness of breath.      Marland Kitchen aspirin 81 MG chewable tablet Chew 81 mg by mouth daily.      Marland Kitchen atorvastatin (LIPITOR) 20 MG tablet Take 20 mg by mouth daily.      Marland Kitchen levofloxacin (LEVAQUIN) 750 MG tablet Take 1 tablet (750 mg total) by mouth daily.  5 tablet  0  . nicotine (NICODERM CQ - DOSED IN MG/24 HOURS) 14 mg/24hr patch Place 1 patch (14 mg total) onto the skin once.  28 patch  0  . predniSONE (STERAPRED UNI-PAK) 10 MG tablet Take by mouth daily. Prednisone dosing: Take  Prednisone 40mg  (4 tabs) x 3 days, then  taper to 30mg  (3 tabs) x 3 days, then 20mg  (2 tabs) x 3days, then 10mg  (1 tab) x 3days, then OFF.  Dispense:  30 tabs, refills: None  30 tablet  0  . tiotropium (SPIRIVA) 18 MCG inhalation capsule Place 18 mcg into inhaler and inhale daily.      . Aspirin-Acetaminophen-Caffeine (GOODY HEADACHE PO) Take 1 packet by mouth daily as needed.       dextromethorphan-guaiFENesin (MUCINEX DM) 30-600 MG per 12 hr tablet Take 1 tablet by mouth 2 (two) times daily.  14 tablet  0  . feeding supplement, ENSURE COMPLETE, (ENSURE COMPLETE) LIQD Take 237 mLs by mouth 2 (two) times daily between meals.      ipratropium-albuterol (DUONEB) 0.5-2.5 (3) MG/3ML SOLN Take 3 mLs by nebulization every 4 (four) hours as needed (wheezing).       No current facility-administered medications on file prior to visit.   Family History  Problem Relation Age of Onset  . Cancer Mother    History   Social History  . Marital Status: Legally Separated    Spouse Name: N/A    Number of Children: N/A  . Years of Education: N/A   Occupational History  . Not on file.  Social History Main Topics  . Smoking status: Current Every Day Smoker -- 1.50 packs/day for 40 years    Types: Cigarettes  . Smokeless tobacco: Not on file  . Alcohol Use: No  . Drug Use: No  . Sexual Activity: Yes    Birth Control/ Protection: Post-menopausal   Other Topics Concern  . Not on file   Social History Narrative  . No narrative on file    Review of Systems  Respiratory: Positive for cough and shortness of breath.   Cardiovascular: Negative.   Neurological: Negative for dizziness and tingling.  All other systems reviewed and are negative.     Objective:   Filed Vitals:   10/18/13 1130  BP: 139/71  Pulse: 99  Temp: 99.3 F (37.4 C)  Resp: 14    Physical Exam  Constitutional: She is oriented to person, place, and time.  Neck: No JVD present.  Cardiovascular: Normal rate, regular rhythm and normal heart sounds.    Pulmonary/Chest: Effort normal. She has wheezes.  Diminished breath sounds  Abdominal: Soft. Bowel sounds are normal.  Musculoskeletal: She exhibits no edema.  Neurological: She is alert and oriented to person, place, and time.     Lab Results  Component Value Date   WBC 10.6* 10/15/2013   HGB 12.0 10/15/2013   HCT 36.1 10/15/2013   MCV 92.3 10/15/2013   PLT 273 10/15/2013   Lab Results  Component Value Date   CREATININE 0.60 10/15/2013   BUN 12 10/15/2013   NA 137 10/15/2013   K 4.6 10/15/2013   CL 100 10/15/2013   CO2 25 10/15/2013    No results found for this basename: HGBA1C   Lipid Panel     Component Value Date/Time   CHOL 259* 05/08/2013 0859   TRIG 141 05/08/2013 0859   HDL 47 05/08/2013 0859   CHOLHDL 5.5 05/08/2013 0859   VLDL 28 05/08/2013 0859   LDLCALC 184* 05/08/2013 0859       Assessment and plan:   Ravon was seen today for follow-up.  Diagnoses and associated orders for this visit:  Essential hypertension, benign - Change to lisinopril (PRINIVIL,ZESTRIL) 5 MG tablet; Take 1 tablet (5 mg total) by mouth daily. Discontinued previous dose of lisinopril-hydrochlorothiazide do to soft pressure.   COPD exacerbation - dextromethorphan-guaiFENesin (MUCINEX DM) 30-600 MG per 12 hr tablet; Take 1 tablet by mouth 2 (two) times daily.  Lung nodule - CT Chest W Contrast; Future - Ambulatory referral to Pulmonology  Tobacco abuse Discussed with patient the dangers of smoking with oxygen use. Explained to patient that she was found to have a lung nodule and that smoking significantly increase her risk of cancer. Will continue to address on each visit  Encounter for immunization Flu shot received   Return in about 1 week (around 10/25/2013) for Nurse Visit-BP check and 3 mo PCP.       Holland Commons, NP-C [facility] [phone] 10/18/2013, 11:55 AM

## 2013-10-18 NOTE — Progress Notes (Signed)
Pt is here following up on her hyperlipidemia, COPD and HTN Pt was recently hospitalized w/ acute respiratory failure.

## 2013-10-18 NOTE — Progress Notes (Signed)
10/18/2013 1320 Received call from pt and states called [facility]. They are stating they do not have an acct for her. Explained NCM will follow up with [facility] for oxygen. Requested NCM fax dc summary to office. Faxed orders, sats, and dc summary to [facility].  Isidoro Donning RN CCM Case Mgmt phone [phone]

## 2013-10-18 NOTE — Patient Instructions (Addendum)
Smoking Cessation Quitting smoking is important to your health and has many advantages. However, it is not always easy to quit since nicotine is a very addictive drug. Oftentimes, people try 3 times or more before being able to quit. This document explains the best ways for you to prepare to quit smoking. Quitting takes hard work and a lot of effort, but you can do it. ADVANTAGES OF QUITTING SMOKING  You will live longer, feel better, and live better.  Your body will feel the impact of quitting smoking almost immediately.  Within 20 minutes, blood pressure decreases. Your pulse returns to its normal level.  After 8 hours, carbon monoxide levels in the blood return to normal. Your oxygen level increases.  After 24 hours, the chance of having a heart attack starts to decrease. Your breath, hair, and body stop smelling like smoke.  After 48 hours, damaged nerve endings begin to recover. Your sense of taste and smell improve.  After 72 hours, the body is virtually free of nicotine. Your bronchial tubes relax and breathing becomes easier.  After 2 to 12 weeks, lungs can hold more air. Exercise becomes easier and circulation improves.  The risk of having a heart attack, stroke, cancer, or lung disease is greatly reduced.  After 1 year, the risk of coronary heart disease is cut in half.  After 5 years, the risk of stroke falls to the same as a nonsmoker.  After 10 years, the risk of lung cancer is cut in half and the risk of other cancers decreases significantly.  After 15 years, the risk of coronary heart disease drops, usually to the level of a nonsmoker.  If you are pregnant, quitting smoking will improve your chances of having a healthy baby.  The people you live with, especially any children, will be healthier.  You will have extra money to spend on things other than cigarettes. QUESTIONS TO THINK ABOUT BEFORE ATTEMPTING TO QUIT You may want to talk about your answers with your  health care provider.  Why do you want to quit?  If you tried to quit in the past, what helped and what did not?  What will be the most difficult situations for you after you quit? How will you plan to handle them?  Who can help you through the tough times? Your family? Friends? A health care provider?  What pleasures do you get from smoking? What ways can you still get pleasure if you quit? Here are some questions to ask your health care provider:  How can you help me to be successful at quitting?  What medicine do you think would be best for me and how should I take it?  What should I do if I need more help?  What is smoking withdrawal like? How can I get information on withdrawal? GET READY  Set a quit date.  Change your environment by getting rid of all cigarettes, ashtrays, matches, and lighters in your home, car, or work. Do not let people smoke in your home.  Review your past attempts to quit. Think about what worked and what did not. GET SUPPORT AND ENCOURAGEMENT You have a better chance of being successful if you have help. You can get support in many ways.  Tell your family, friends, and coworkers that you are going to quit and need their support. Ask them not to smoke around you.  Get individual, group, or telephone counseling and support. Programs are available at [employer] and health centers. Call  your local health department for information about programs in your area.  Spiritual beliefs and practices may help some smokers quit.  Download a "quit meter" on your computer to keep track of quit statistics, such as how long you have gone without smoking, cigarettes not smoked, and money saved.  Get a self-help book about quitting smoking and staying off tobacco. LEARN NEW SKILLS AND BEHAVIORS  Distract yourself from urges to smoke. Talk to someone, go for a walk, or occupy your time with a task.  Change your normal routine. Take a different route to work.  Drink tea instead of coffee. Eat breakfast in a different place.  Reduce your stress. Take a hot bath, exercise, or read a book.  Plan something enjoyable to do every day. Reward yourself for not smoking.  Explore interactive web-based programs that specialize in helping you quit. GET MEDICINE AND USE IT CORRECTLY Medicines can help you stop smoking and decrease the urge to smoke. Combining medicine with the above behavioral methods and support can greatly increase your chances of successfully quitting smoking.  Nicotine replacement therapy helps deliver nicotine to your body without the negative effects and risks of smoking. Nicotine replacement therapy includes nicotine gum, lozenges, inhalers, nasal sprays, and skin patches. Some may be available over-the-counter and others require a prescription.  Antidepressant medicine helps people abstain from smoking, but how this works is unknown. This medicine is available by prescription.  Nicotinic receptor partial agonist medicine simulates the effect of nicotine in your brain. This medicine is available by prescription. Ask your health care provider for advice about which medicines to use and how to use them based on your health history. Your health care provider will tell you what side effects to look out for if you choose to be on a medicine or therapy. Carefully read the information on the package. Do not use any other product containing nicotine while using a nicotine replacement product.  RELAPSE OR DIFFICULT SITUATIONS Most relapses occur within the first 3 months after quitting. Do not be discouraged if you start smoking again. Remember, most people try several times before finally quitting. You may have symptoms of withdrawal because your body is used to nicotine. You may crave cigarettes, be irritable, feel very hungry, cough often, get headaches, or have difficulty concentrating. The withdrawal symptoms are only temporary. They are strongest  when you first quit, but they will go away within 10-14 days. To reduce the chances of relapse, try to:  Avoid drinking alcohol. Drinking lowers your chances of successfully quitting.  Reduce the amount of caffeine you consume. Once you quit smoking, the amount of caffeine in your body increases and can give you symptoms, such as a rapid heartbeat, sweating, and anxiety.  Avoid smokers because they can make you want to smoke.  Do not let weight gain distract you. Many smokers will gain weight when they quit, usually less than 10 pounds. Eat a healthy diet and stay active. You can always lose the weight gained after you quit.  Find ways to improve your mood other than smoking. FOR MORE INFORMATION  www.smokefree.gov  Document Released: 12/14/2000 Document Revised: 05/06/2013 Document Reviewed: 03/31/2011 [facility] Patient Information 2015 [place], [place]. This information is not intended to replace advice given to you by your health care provider. Make sure you discuss any questions you have with your health care provider. Pulmonary Nodule  A pulmonary nodule is a small, round spot in your lung. It is usually found when pictures of your  lungs are taken for other reasons. Most pulmonary nodules are not cancerous and do not cause symptoms. Tests will be done to make sure the nodule is not cancerous. Pulmonary nodules that are not cancerous usually do not require treatment. HOME CARE   Only take medicine as told by your doctor.  Follow up with your doctor as told. GET HELP IF:  You have trouble breathing when doing activities.  You feel sick or more tired than normal.  You do not feel like eating.  You lose weight without trying to.  You have chills.  You have night sweats. GET HELP RIGHT AWAY IF:  You cannot catch your breath.  You start making whistling sounds when breathing (wheezing).  You have a cough that does not go away.  You cough up blood.  You are dizzy or feel  like you are going to pass out.  You have sudden chest pain.  You have a fever or lasting symptoms for more than 2-3 days.  You have a fever and your symptoms suddenly get worse. MAKE SURE YOU:  Understand these instructions.  Will watch your condition.  Will get help right away if you are not doing well or get worse. Document Released: 01/22/2010 Document Revised: 08/22/2012 Document Reviewed: 06/11/2012 [facility] Patient Information 2015 [place], [place]. This information is not intended to replace advice given to you by your health care provider. Make sure you discuss any questions you have with your health care provider.

## 2013-10-22 ENCOUNTER — Ambulatory Visit (HOSPITAL_COMMUNITY): Payer: Medicaid Other

## 2013-10-23 ENCOUNTER — Encounter: Payer: Self-pay | Admitting: Internal Medicine

## 2013-10-23 ENCOUNTER — Ambulatory Visit (INDEPENDENT_AMBULATORY_CARE_PROVIDER_SITE_OTHER): Payer: Medicaid Other | Admitting: Internal Medicine

## 2013-10-23 VITALS — BP 150/80 | HR 60 | Temp 98.6°F | Ht 64.0 in | Wt 133.0 lb

## 2013-10-23 DIAGNOSIS — J449 Chronic obstructive pulmonary disease, unspecified: Secondary | ICD-10-CM

## 2013-10-23 DIAGNOSIS — J9601 Acute respiratory failure with hypoxia: Secondary | ICD-10-CM

## 2013-10-23 DIAGNOSIS — J9611 Chronic respiratory failure with hypoxia: Secondary | ICD-10-CM

## 2013-10-23 DIAGNOSIS — R911 Solitary pulmonary nodule: Secondary | ICD-10-CM

## 2013-10-23 DIAGNOSIS — I10 Essential (primary) hypertension: Secondary | ICD-10-CM

## 2013-10-23 MED ORDER — BUDESONIDE-FORMOTEROL FUMARATE 160-4.5 MCG/ACT IN AERO: 2.0000 | INHALATION_SPRAY | Freq: Two times a day (BID) | RESPIRATORY_TRACT | Status: DC

## 2013-10-23 NOTE — Progress Notes (Signed)
Subjective:    Patient ID: Judith Scott, female    DOB: [birth year]  MRN: [id number]  HPI  37 yowf quit smoking on admit  dx with copd around 2010 using albuterol neb around twice daily but good activity tolerance then 2014 started on spriva then  x 2 weeks gradually worse > admit  Admit date: 10/13/2013  Discharge date: 10/15/2013  Time spent: 45 minutes  Recommendations for Outpatient Follow-up:  Patient will be discharged home. She will be given supplemental oxygen. Patient should follow up with her primary care physician within one week of discharge. Discussed x-ray findings with the patient, she is to have a CT scan done as an outpatient and she by her primary care physician. Patient medications as prescribed. She should continue on a heart healthy diet.  Discharge Diagnoses:  Acute respiratory failure with hypoxia secondary to COPD exacerbation  COPD exacerbation secondary to continued tobacco abuse which is by radiology  Tobacco abuse  Nodular opacity found on x-ray  Hyponatremia and hypochloremia  Hypertension  Hyperlipidemia  Discharge Condition: Stable  Diet recommendation: Heart healthy  Filed Weights    10/13/13 1411   Weight:  59.2 kg (130 lb 8.2 oz)   History of present illness:  on 10/13/2013  Judith Scott is a 58 y.o. female with a history of COPD, hypertension, tobacco abuse, presented to the emergency department with complaints of fever, chills, nonproductive cough and shortness of breath has been ongoing for the past 3-4 days. Patient stated that she tried using her nebulizer treatments at home as well as rescue inhalers without resolution of her symptoms or improvement. Patient stated laying down aggravates her symptoms, nothing seems to make her symptoms better. She admitted that her granddaughter has an ear infection, her daughter has an upper respiratory infection. Patient does not have oxygen at home. Patient does not see a pulmonologist. Patient did admit to  being admitted previously for similar presentation. Upon admission, patient denied any chest pain, headache, abdominal pain, nausea, vomiting, diarrhea, constipation. Denied ny recent travel.  In the emergency department, patient was found to be hypoxic upon arrival. She was given several DuoNeb treatments as well as some Solu-Medrol.  Hospital Course:  Acute respiratory failure with hypoxia secondary to COPD exacerbation  -Improving  -X-ray showed COPD/emphysema, nodular opacity in the lateral right lung base, recommended repeat PA lateral chest x-ray  -Patient was tested at) found to be 87%, While ambulating 78%.  -Patient discharge with Levaquin as well as prednisone taper guaifenesin and supplemental oxygen.  COPD exacerbation secondary to continued tobacco abuse versus viral etiology  -Patient counseled in smoking cessation  -Patient has had recent sick contacts with upper respiratory infection  -Treatment and plan of the above  Tobacco abuse  -Patient has been counseled regarding smoking cessation  -Placed on nicotine patch  Nodular opacity found on x-ray  -Repeat CXR: Persistent small 41mm RLL nodule (new since 2014), recommended outpatient CT  -discussed this with the patient, she understand the need for followup  Hyponatremia and hypochloremia  -Resolved with IVF  -Likely secondary to dehydration and poor oral intake with continued use of diuretics  -HCTZ held during hospitalization  Hypertension  -Patient currently have soft blood pressures, will hold lisinopril/HCTZ  -Patient should discuss her blood pressure medications with her primary care physician.  Hyperlipidemia  -Continue statin      10/23/2013 1st [facility] office visit/ [facility] / post hosp f/u 02 3lpm and no longer smoking  Chief Complaint  Patient  presents with  . Pulmonary Consult    Referred by Dr. Luna Glasgow. Pt states dxed with COPD approx 5 yrs ago. She was had recent exacerbation and was admitted to  hospital from 10/13/13 to 10/15/13. She states that her breathing is back to her normal baseline. She has not had to use her rescue inhaler and uses her albuterol neb 4 x per day.   back to baseline activity tol but 02 dep  Cough is clear/ slt yellow worse in ams Confused with how/ when to use saba with poor hfa and dpi so neb dependent     No obvious  day to day or daytime variabilty or assoc  cp or chest tightness, subjective wheeze overt sinus or hb symptoms. No unusual exp hx or h/o childhood pna/ asthma or knowledge of premature birth.  Sleeping ok without nocturnal  or early am exacerbation  of respiratory  c/o's or need for noct saba. Also denies any obvious fluctuation of symptoms with weather or environmental changes or other aggravating or alleviating factors except as outlined above   Current Medications, Allergies, Complete Past Medical History, Past Surgical History, Family History, and Social History were reviewed in [employer] record.            Review of Systems  Constitutional: Negative for fever, chills and unexpected weight change.  HENT: Negative for congestion, dental problem, ear pain, nosebleeds, postnasal drip, rhinorrhea, sinus pressure, sneezing, sore throat, trouble swallowing and voice change.   Eyes: Negative for visual disturbance.  Respiratory: Positive for shortness of breath. Negative for cough and choking.   Cardiovascular: Negative for chest pain and leg swelling.  Gastrointestinal: Negative for vomiting, abdominal pain and diarrhea.  Genitourinary: Negative for difficulty urinating.  Musculoskeletal: Negative for arthralgias.  Skin: Negative for rash.  Neurological: Negative for tremors, syncope and headaches.  Hematological: Does not bruise/bleed easily.       Objective:   Physical Exam  Wt Readings from Last 3 Encounters:  10/23/13 133 lb (60.328 kg)  10/18/13 130 lb (58.968 kg)  10/13/13 130 lb 8.2 oz (59.2 kg)       HEENT mild turbinate edema.  Oropharynx no thrush or excess pnd or cobblestoning.  No JVD or cervical adenopathy. Mild accessory muscle hypertrophy. Trachea midline, nl thryroid. Chest was hyperinflated by percussion with diminished breath sounds and moderate increased exp time without wheeze. Hoover sign positive at mid inspiration. Regular rate and rhythm without murmur gallop or rub or increase P2 or edema.  Abd: no hsm, nl excursion. Ext warm without cyanosis or clubbing.     cxr 10/14/13  1. Persistent small 8 mm right lower lung nodule, radiographically  new since 2014. Recommend followup chest CT (e.g. as outpatient) to  characterize and establish a CT baseline.  2. Suspect progression of chronic pulmonary interstitial changes  since 2014. Chronic hyperinflation.      Assessment & Plan:

## 2013-10-23 NOTE — Assessment & Plan Note (Signed)
Placed in tickle file for ct w/in one month

## 2013-10-23 NOTE — Patient Instructions (Addendum)
symbicort 160 Take 2 puffs first thing in am and then another 2 puffs about 12 hours later.   spiriva each am  Only use your albuterol (proair) as a rescue medication to be used if you can't catch your breath by resting or doing a relaxed purse lip breathing pattern.  - The less you use it, the better it will work when you need it. - Ok to use up to 2 puffs  every 4 hours if you must but call for immediate appointment if use goes up over your usual need - Don't leave home without it !!  (think of it like the spare tire for your car)   Only use the nebulizer if the proair fails to work  Work on inhaler technique:  relax and gently blow all the way out then take a nice smooth deep breath back in, triggering the inhaler at same time you start breathing in.  Hold for up to 5 seconds if you can.  Rinse and gargle with water when done  Ideally you should not be on lisinopril   Ok to leave off 02 at rest and walking room to room but continue at bedtime and walking more than room to room at home  Please schedule a follow up office visit in 2 weeks, sooner if needed  To see Tammy NP on return

## 2013-10-23 NOTE — Assessment & Plan Note (Signed)

## 2013-10-23 NOTE — Assessment & Plan Note (Signed)
DDX of  difficult airways management all start with A and  include Adherence, Ace Inhibitors, Acid Reflux, Active Sinus Disease, Alpha 1 Antitripsin deficiency, Anxiety masquerading as Airways dz,  ABPA,  allergy(esp in young), Aspiration (esp in elderly), Adverse effects of DPI,  Active smokers, plus two Bs  = Bronchiectasis and Beta blocker use..and one C= CHF  Adherence is always the initial "prime suspect" and is a multilayered concern that requires a "trust but verify" approach in every patient - starting with knowing how to use medications, especially inhalers, correctly, keeping up with refills and understanding the fundamental difference between maintenance and prns vs those medications only taken for a very short course and then stopped and not refilled.  - thoroughly confused on saba rx - The proper method of use, as well as anticipated side effects, of a metered-dose inhaler are discussed and demonstrated to the patient. Improved effectiveness after extensive coaching during this visit to a level of approximately  50% so worth trying symbicort 160/spriva  ? acei > prefer she be off to reduce confusion with interpretation of symptoms  ? Active smoking > denies/ reinforced

## 2013-10-23 NOTE — Assessment & Plan Note (Signed)
-   d/c on 3lpm 10/15/13 - 10/23/2013   Walked RA x one lap @ 185 stopped due to  desat to 87%  With sob at a nl pace at end of walk  As of 10/23/2013 rec 02 3lpm at hs and walking outside her house but not needed at rest  

## 2013-10-28 ENCOUNTER — Other Ambulatory Visit: Payer: Self-pay | Admitting: Internal Medicine

## 2013-10-28 NOTE — Telephone Encounter (Signed)
Patient calling to request med refill for "pro air" please assist. Patient states she uses [facility] on [address].

## 2013-10-29 ENCOUNTER — Encounter: Payer: Self-pay | Admitting: Internal Medicine

## 2013-10-31 ENCOUNTER — Ambulatory Visit: Payer: Medicaid Other | Attending: Internal Medicine | Admitting: *Deleted

## 2013-10-31 ENCOUNTER — Telehealth: Payer: Self-pay | Admitting: Emergency Medicine

## 2013-10-31 ENCOUNTER — Other Ambulatory Visit: Payer: Self-pay | Admitting: Emergency Medicine

## 2013-10-31 VITALS — BP 103/62 | HR 88 | Temp 98.2°F | Resp 22

## 2013-10-31 DIAGNOSIS — I10 Essential (primary) hypertension: Secondary | ICD-10-CM | POA: Diagnosis present

## 2013-10-31 NOTE — Progress Notes (Signed)
Patient ID: Judith Scott, female   DOB: 11-27-1955, 58 y.o.   MRN: [id number]  Patient presents for BP check States taking lisinopril 5 mg daily as directed Feeling well today Patient states she continues to smoke 7 cigs/day. Plans on quitting 11/03/13 Patient states she monitors O2 sat q HS and have been > 90%   BP 103/62 P 88 T 98.2 R 22  SPO2 94%   Patient aware to f/u with PCP 3 months from last visit

## 2013-11-06 ENCOUNTER — Encounter: Payer: Self-pay | Admitting: Adult Health

## 2013-11-06 ENCOUNTER — Ambulatory Visit (INDEPENDENT_AMBULATORY_CARE_PROVIDER_SITE_OTHER): Payer: Medicaid Other | Admitting: Adult Health

## 2013-11-06 VITALS — BP 138/78 | HR 75 | Temp 97.1°F | Ht 64.0 in | Wt 132.4 lb

## 2013-11-06 DIAGNOSIS — I10 Essential (primary) hypertension: Secondary | ICD-10-CM

## 2013-11-06 DIAGNOSIS — J9611 Chronic respiratory failure with hypoxia: Secondary | ICD-10-CM

## 2013-11-06 DIAGNOSIS — J449 Chronic obstructive pulmonary disease, unspecified: Secondary | ICD-10-CM

## 2013-11-06 DIAGNOSIS — R911 Solitary pulmonary nodule: Secondary | ICD-10-CM

## 2013-11-06 MED ORDER — BUDESONIDE-FORMOTEROL FUMARATE 160-4.5 MCG/ACT IN AERO: 2.0000 | INHALATION_SPRAY | Freq: Two times a day (BID) | RESPIRATORY_TRACT | Status: DC

## 2013-11-06 NOTE — Assessment & Plan Note (Signed)
Attending with oxygen with activity and at bedtime

## 2013-11-06 NOTE — Addendum Note (Signed)
Addended by: Maisie Fus on: 11/06/2013 10:44 AM   Modules accepted: Orders

## 2013-11-06 NOTE — Assessment & Plan Note (Addendum)
Recent COPD exacerbation, now resolved. Patient is doing well on Symbicort and Spiriva Encouraged on smoking cessation.  PFTs. On return visit  Plan  Continue to wear Oxygen 3 l/m with long distance walking and At bedtime  .  Return in 6 weeks with Dr. Sherene Sires  And PFT .  Continue on working to quit smoking -most important goal .  Discuss with family doctor regarding change your Lisinopril to alternative to due to cough.  Continue on Symbicort and Spiriva .  Rinse after all inhaler use.  We will set up CT chest to follow lung nodule prior to next office visit.

## 2013-11-06 NOTE — Patient Instructions (Addendum)
Continue to wear Oxygen 3 l/m with long distance walking and At bedtime  .  Return in 6 weeks with Dr. Sherene Sires  And PFT .  Continue on working to quit smoking -most important goal .  Discuss with family doctor regarding change your Lisinopril to alternative to due to cough.  Continue on Symbicort and Spiriva .  Rinse after all inhaler use.  We will set up CT chest to follow lung nodule prior to next office visit.

## 2013-11-06 NOTE — Assessment & Plan Note (Signed)
8 mm lung nodule seen in the right lower lobe on chest x-ray Patient will be set up for a CT chest Prior to office visit

## 2013-11-06 NOTE — Progress Notes (Signed)
Subjective:    Patient ID: Judith Scott, female    DOB: [birth year]  MRN: [id number]  HPI  37 yowf quit smoking on admit  dx with copd around 2010 using albuterol neb around twice daily but good activity tolerance then 2014 started on spriva then  x 2 weeks gradually worse > admit  Admit date: 10/13/2013  Discharge date: 10/15/2013  Time spent: 45 minutes  Recommendations for Outpatient Follow-up:  Patient will be discharged home. She will be given supplemental oxygen. Patient should follow up with her primary care physician within one week of discharge. Discussed x-ray findings with the patient, she is to have a CT scan done as an outpatient and she by her primary care physician. Patient medications as prescribed. She should continue on a heart healthy diet.  Discharge Diagnoses:  Acute respiratory failure with hypoxia secondary to COPD exacerbation  COPD exacerbation secondary to continued tobacco abuse which is by radiology  Tobacco abuse  Nodular opacity found on x-ray  Hyponatremia and hypochloremia  Hypertension  Hyperlipidemia  Discharge Condition: Stable  Diet recommendation: Heart healthy  Filed Weights    10/13/13 1411   Weight:  59.2 kg (130 lb 8.2 oz)   History of present illness:  on 10/13/2013  Judith Scott is a 58 y.o. female with a history of COPD, hypertension, tobacco abuse, presented to the emergency department with complaints of fever, chills, nonproductive cough and shortness of breath has been ongoing for the past 3-4 days. Patient stated that she tried using her nebulizer treatments at home as well as rescue inhalers without resolution of her symptoms or improvement. Patient stated laying down aggravates her symptoms, nothing seems to make her symptoms better. She admitted that her granddaughter has an ear infection, her daughter has an upper respiratory infection. Patient does not have oxygen at home. Patient does not see a pulmonologist. Patient did admit to  being admitted previously for similar presentation. Upon admission, patient denied any chest pain, headache, abdominal pain, nausea, vomiting, diarrhea, constipation. Denied ny recent travel.  In the emergency department, patient was found to be hypoxic upon arrival. She was given several DuoNeb treatments as well as some Solu-Medrol.  Hospital Course:  Acute respiratory failure with hypoxia secondary to COPD exacerbation  -Improving  -X-ray showed COPD/emphysema, nodular opacity in the lateral right lung base, recommended repeat PA lateral chest x-ray  -Patient was tested at) found to be 87%, While ambulating 78%.  -Patient discharge with Levaquin as well as prednisone taper guaifenesin and supplemental oxygen.  COPD exacerbation secondary to continued tobacco abuse versus viral etiology  -Patient counseled in smoking cessation  -Patient has had recent sick contacts with upper respiratory infection  -Treatment and plan of the above  Tobacco abuse  -Patient has been counseled regarding smoking cessation  -Placed on nicotine patch  Nodular opacity found on x-ray  -Repeat CXR: Persistent small 41mm RLL nodule (new since 2014), recommended outpatient CT  -discussed this with the patient, she understand the need for followup  Hyponatremia and hypochloremia  -Resolved with IVF  -Likely secondary to dehydration and poor oral intake with continued use of diuretics  -HCTZ held during hospitalization  Hypertension  -Patient currently have soft blood pressures, will hold lisinopril/HCTZ  -Patient should discuss her blood pressure medications with her primary care physician.  Hyperlipidemia  -Continue statin      10/23/2013 1st [facility] office visit/ Wert / post hosp f/u 02 3lpm and no longer smoking  Chief Complaint  Patient  presents with  . Pulmonary Consult    Referred by Dr. Luna Glasgow. Pt states dxed with COPD approx 5 yrs ago. She was had recent exacerbation and was admitted to  hospital from 10/13/13 to 10/15/13. She states that her breathing is back to her normal baseline. She has not had to use her rescue inhaler and uses her albuterol neb 4 x per day.   back to baseline activity tol but 02 dep  Cough is clear/ slt yellow worse in ams Confused with how/ when to use saba with poor hfa and dpi so neb dependent  >rx symbicort and spiriva    11/06/2013 Follow up COPD/Chronic O2 depend RF /Lung nodule  Returns for follow up .   Advised per MW to stop Lisinopril, PCP told patient to remain on it but at lower dose - 5mg . Using O2 at night and PRN daytime.  Seen last ov for post hospital follow up .  Started on Symbicort . Remains on Spiriva  Needs PFTs Feels her breathing is better since discharge.  No flare in cough or wheezing  Gets winded with walking incline or long distance.  Helps with grandchild, daughter had stroke .  As any hemoptysis, chest pain, orthopnea, PND or leg swelling.    Current Medications, Allergies, Complete Past Medical History, Past Surgical History, Family History, and Social History were reviewed in record.            Review of Systems  Constitutional: Negative for fever, chills and unexpected weight change.  HENT: Negative for congestion, dental problem, ear pain, nosebleeds, postnasal drip, rhinorrhea, sinus pressure, sneezing, sore throat, trouble swallowing and voice change.   Eyes: Negative for visual disturbance.  Respiratory: Positive for shortness of breath. Negative for cough and choking.   Cardiovascular: Negative for chest pain and leg swelling.  Gastrointestinal: Negative for vomiting, abdominal pain and diarrhea.  Genitourinary: Negative for difficulty urinating.  Musculoskeletal: Negative for arthralgias.  Skin: Negative for rash.  Neurological: Negative for tremors, syncope and headaches.  Hematological: Does not bruise/bleed easily.       Objective:   Physical Exam     HEENT  mild turbinate edema.  Oropharynx no thrush or excess pnd or cobblestoning.  No JVD or cervical adenopathy. Mild accessory muscle hypertrophy. Trachea midline, nl thryroid. Chest was hyperinflated by percussion with diminished breath sounds and moderate increased exp time without wheeze. Hoover sign positive at mid inspiration. Regular rate and rhythm without murmur gallop or rub or increase P2 or edema.  Abd: no hsm, nl excursion. Ext warm without cyanosis or clubbing.     cxr 10/14/13  1. Persistent small 8 mm right lower lung nodule, radiographically  new since 2014. Recommend followup chest CT (e.g. as outpatient) to  characterize and establish a CT baseline.  2. Suspect progression of chronic pulmonary interstitial changes  since 2014. Chronic hyperinflation.      Assessment & Plan:

## 2013-11-06 NOTE — Assessment & Plan Note (Signed)
Would avoid ACE inhibitor to in this patient as she has potential for recurrent exacerbations.

## 2013-11-11 ENCOUNTER — Telehealth: Payer: Self-pay | Admitting: Adult Health

## 2013-11-11 MED ORDER — BUDESONIDE-FORMOTEROL FUMARATE 160-4.5 MCG/ACT IN AERO: 2.0000 | INHALATION_SPRAY | Freq: Two times a day (BID) | RESPIRATORY_TRACT | Status: DC

## 2013-11-11 NOTE — Telephone Encounter (Signed)
Spoke with pt, called in [facility]icort to [facility] on [address].  Nothing further needed.

## 2013-12-09 ENCOUNTER — Other Ambulatory Visit (INDEPENDENT_AMBULATORY_CARE_PROVIDER_SITE_OTHER): Payer: Medicaid Other

## 2013-12-09 ENCOUNTER — Other Ambulatory Visit: Payer: Self-pay | Admitting: Internal Medicine

## 2013-12-09 DIAGNOSIS — J449 Chronic obstructive pulmonary disease, unspecified: Secondary | ICD-10-CM

## 2013-12-09 LAB — BASIC METABOLIC PANEL
BUN: 6 mg/dL (ref 6–23)
CHLORIDE: 103 meq/L (ref 96–112)
CO2: 32 mEq/L (ref 19–32)
CREATININE: 0.7 mg/dL (ref 0.4–1.2)
Calcium: 9.3 mg/dL (ref 8.4–10.5)
GFR: 98.56 mL/min (ref >60.00)
Glucose, Bld: 79 mg/dL (ref 70–99)
POTASSIUM: 3.7 meq/L (ref 3.5–5.1)
SODIUM: 141 meq/L (ref 135–145)

## 2013-12-16 ENCOUNTER — Ambulatory Visit (INDEPENDENT_AMBULATORY_CARE_PROVIDER_SITE_OTHER)
Admission: RE | Admit: 2013-12-16 | Discharge: 2013-12-16 | Disposition: A | Discharge status: Home / Self Care | Payer: Medicaid Other | Source: Ambulatory Visit | Attending: Adult Health | Admitting: Adult Health

## 2013-12-16 DIAGNOSIS — R911 Solitary pulmonary nodule: Secondary | ICD-10-CM

## 2013-12-16 MED ORDER — IOHEXOL 300 MG/ML  SOLN
80.0000 mL | Freq: Once | INTRAMUSCULAR | Status: AC | PRN
  Administered 2013-12-16: 80 mL via INTRAVENOUS

## 2013-12-17 ENCOUNTER — Other Ambulatory Visit: Payer: Self-pay | Admitting: Internal Medicine

## 2013-12-17 DIAGNOSIS — R0600 Dyspnea, unspecified: Secondary | ICD-10-CM

## 2013-12-18 ENCOUNTER — Ambulatory Visit (INDEPENDENT_AMBULATORY_CARE_PROVIDER_SITE_OTHER): Payer: Medicaid Other | Admitting: Internal Medicine

## 2013-12-18 ENCOUNTER — Encounter: Payer: Self-pay | Admitting: Internal Medicine

## 2013-12-18 VITALS — BP 142/76 | HR 71 | Ht 65.0 in | Wt 137.0 lb

## 2013-12-18 DIAGNOSIS — J449 Chronic obstructive pulmonary disease, unspecified: Secondary | ICD-10-CM

## 2013-12-18 DIAGNOSIS — R0600 Dyspnea, unspecified: Secondary | ICD-10-CM

## 2013-12-18 DIAGNOSIS — R911 Solitary pulmonary nodule: Secondary | ICD-10-CM | POA: Diagnosis not present

## 2013-12-18 DIAGNOSIS — I10 Essential (primary) hypertension: Secondary | ICD-10-CM

## 2013-12-18 DIAGNOSIS — Z720 Tobacco use: Secondary | ICD-10-CM

## 2013-12-18 DIAGNOSIS — J9611 Chronic respiratory failure with hypoxia: Secondary | ICD-10-CM | POA: Diagnosis not present

## 2013-12-18 DIAGNOSIS — F17210 Nicotine dependence, cigarettes, uncomplicated: Secondary | ICD-10-CM

## 2013-12-18 LAB — PULMONARY FUNCTION TEST
DL/VA % pred: 50 %
DL/VA: 2.49 ml/min/mmHg/L
DLCO UNC % PRED: 38 %
DLCO UNC: 9.94 ml/min/mmHg
FEF 25-75 POST: 0.33 L/s
FEF 25-75 Pre: 0.28 L/sec
FEF2575-%Change-Post: 16 %
FEF2575-%PRED-POST: 13 %
FEF2575-%Pred-Pre: 11 %
FEV1-%Change-Post: 6 %
FEV1-%Pred-Post: 30 %
FEV1-%Pred-Pre: 28 %
FEV1-Post: 0.79 L
FEV1-Pre: 0.75 L
FEV1FVC-%Change-Post: 3 %
FEV1FVC-%Pred-Pre: 62 %
FEV6-%CHANGE-POST: 3 %
FEV6-%PRED-PRE: 44 %
FEV6-%Pred-Post: 46 %
FEV6-POST: 1.54 L
FEV6-Pre: 1.48 L
FEV6FVC-%CHANGE-POST: 1 %
FEV6FVC-%PRED-POST: 101 %
FEV6FVC-%Pred-Pre: 99 %
FVC-%CHANGE-POST: 1 %
FVC-%PRED-PRE: 45 %
FVC-%Pred-Post: 46 %
FVC-POST: 1.58 L
FVC-PRE: 1.55 L
Post FEV1/FVC ratio: 50 %
Post FEV6/FVC ratio: 97 %
Pre FEV1/FVC ratio: 48 %
Pre FEV6/FVC Ratio: 96 %
RV % pred: 157 %
RV: 3.24 L
TLC % pred: 97 %
TLC: 5.06 L

## 2013-12-18 NOTE — Patient Instructions (Addendum)
The key is to stop smoking completely before smoking completely stops you!   Work on inhaler technique:  relax and gently blow all the way out then take a nice smooth deep breath back in, triggering the inhaler at same time you start breathing in.  Hold for up to 5 seconds if you can.  Rinse and gargle with water when done     Please schedule a follow up visit in 3 months but call sooner if needed cxr on return

## 2013-12-18 NOTE — Progress Notes (Signed)
PFT done today. 

## 2013-12-18 NOTE — Progress Notes (Signed)
Subjective:    Patient ID: Judith Scott, female    DOB: 13-Nov-1955  MRN: [id number]    Brief patient profile:  23 yowf  Active smoker dx with copd around 2010 using albuterol neb around twice daily but good activity tolerance then 2014 started on spriva then  x 2 weeks gradually worse > admit  Admit date: 10/13/2013  Discharge date: 10/15/2013    Discharge Diagnoses:  Acute respiratory failure with hypoxia secondary to COPD exacerbation  COPD exacerbation secondary to continued tobacco abuse which is by radiology  Tobacco abuse  Nodular opacity found on x-ray  Hyponatremia and hypochloremia  Hypertension  Hyperlipidemia       10/23/2013 1st  Pulmonary office visit/ [facility] Steg[facility] / post hosp f/u 02 3lpm and no longer smoking  Chief Complaint  Patient presents with  . Pulmonary Consult    Referred by Dr. Luna Glasgow. Pt states dxed with COPD approx 5 yrs ago. She was had recent exacerbation and was admitted to hospital from 10/13/13 to 10/15/13. She states that her breathing is back to her normal baseline. She has not had to use her rescue inhaler and uses her albuterol neb 4 x per day.   back to baseline activity tol but 02 dep  Cough is clear/ slt yellow worse in ams Confused with how/ when to use saba with poor hfa and dpi so neb dependent  >rx symbicort and spiriva    11/06/2013 Follow up COPD/Chronic O2 depend RF /Lung nodule  Returns for follow up .   Advised per MW to stop Lisinopril, PCP told patient to remain on it but at lower dose - 5mg . Using O2 at night and PRN daytime.  Seen last ov for post hospital follow up .  Started on Symbicort . Remains on Spiriva  Needs PFTs Feels her breathing is better since discharge.  No flare in cough or wheezing  Gets winded with walking incline or long distance.  rec Continue to wear Oxygen 3 l/m with long distance walking and At bedtime  .  Return in 6 weeks with Dr.  And PFT .  Continue on working to quit smoking -most important  goal .  Discuss with family doctor regarding change your Lisinopril to alternative to due to cough.  Continue on Symbicort and Spiriva .  Rinse after all inhaler use.      12/18/2013 f/u ov/Beonka Amesquita re: GOLD III / still smoking  Chief Complaint  Patient presents with  . Follow-up    PFT done today. She is using proair 3-4 x per wk, but has not needed neb. No new co's today.   once improved after aecopd resumed smoking. "just my usual bronchitis cough" worse in ams min white to beige mucus, clears up p first 30 min  Doe x > 100 yards, or if walks fast or does any inclines   No obvious day to day or daytime variabilty or assoc cp or chest tightness, subjective wheeze overt sinus or hb symptoms. No unusual exp hx or h/o childhood pna/ asthma or knowledge of premature birth.  Sleeping ok without nocturnal  or early am exacerbation  of respiratory  c/o's or need for noct saba. Also denies any obvious fluctuation of symptoms with weather or environmental changes or other aggravating or alleviating factors except as outlined above   Current Medications, Allergies, Complete Past Medical History, Past Surgical History, Family History, and Social History were reviewed in 12/20/2013 record.  ROS  The following are  not active complaints unless bolded sore throat, dysphagia, dental problems, itching, sneezing,  nasal congestion or excess/ purulent secretions, ear ache,   fever, chills, sweats, unintended wt loss, pleuritic or exertional cp, hemoptysis,  orthopnea pnd or leg swelling, presyncope, palpitations, heartburn, abdominal pain, anorexia, nausea, vomiting, diarrhea  or change in bowel or urinary habits, change in stools or urine, dysuria,hematuria,  rash, arthralgias, visual complaints, headache, numbness weakness or ataxia or problems with walking or coordination,  change in mood/affect or memory.                             Objective:   Physical Exam     12/18/2013      Wt Readings from Last 3 Encounters:  11/06/13 132 lb 6.4 oz (60.056 kg)  10/23/13 133 lb (60.328 kg)  10/18/13 130 lb (58.968 kg)    Vital signs reviewed  HEENT mild turbinate edema.  Oropharynx no thrush or excess pnd or cobblestoning.  No JVD or cervical adenopathy. Mild accessory muscle hypertrophy. Trachea midline, nl thryroid. Chest was hyperinflated by percussion with diminished breath sounds and moderate increased exp time without wheeze. Hoover sign positive at mid inspiration. Regular rate and rhythm without murmur gallop or rub or increase P2 or edema.  Abd: no hsm, nl excursion. Ext warm without cyanosis or clubbing.     cxr 10/14/13  1. Persistent small 8 mm right lower lung nodule, radiographically  new since 2014. Recommend followup chest CT (e.g. as outpatient) to  characterize and establish a CT baseline.  2. Suspect progression of chronic pulmonary interstitial changes  since 2014. Chronic hyperinflation.      Assessment & Plan:

## 2013-12-20 ENCOUNTER — Other Ambulatory Visit: Payer: Self-pay | Admitting: Adult Health

## 2013-12-20 DIAGNOSIS — R911 Solitary pulmonary nodule: Secondary | ICD-10-CM

## 2013-12-21 DIAGNOSIS — F17210 Nicotine dependence, cigarettes, uncomplicated: Secondary | ICD-10-CM | POA: Insufficient documentation

## 2013-12-21 NOTE — Assessment & Plan Note (Signed)

## 2013-12-21 NOTE — Assessment & Plan Note (Addendum)
10/23/2013   try symbicort 160 2bid/spiriva and prn saba 12/17/13 PFTs  FEV1  0.79 (30%) ratio 50 p no sign change from saba and DLOC 38%  12/18/2013 p extensive coaching HFA effectiveness =    75%   DDX of  difficult airways management all start with A and  include Adherence, Ace Inhibitors, Acid Reflux, Active Sinus Disease, Alpha 1 Antitripsin deficiency, Anxiety masquerading as Airways dz,  ABPA,  allergy(esp in young), Aspiration (esp in elderly), Adverse effects of DPI,  Active smokers, plus two Bs  = Bronchiectasis and Beta blocker use..and one C= CHF  Adherence is always the initial "prime suspect" and is a multilayered concern that requires a "trust but verify" approach in every patient - starting with knowing how to use medications, especially inhalers, correctly, keeping up with refills and understanding the fundamental difference between maintenance and prns vs those medications only taken for a very short course and then stopped and not refilled. The proper method of use, as well as anticipated side effects, of a metered-dose inhaler are discussed and demonstrated to the patient. Improved effectiveness after extensive coaching during this visit to a level of approximately  75% so continue symbicort and spiriva  ? ACEi effect > not obvious for now but given the non-specificity of ACEi effects with overlap it would be ideal to chose another class  Acive smoking is still the greatest concern, discussed separately   See instructions for specific recommendations which were reviewed directly with the patient who was given a copy with highlighter outlining the key components.

## 2013-12-21 NOTE — Assessment & Plan Note (Signed)
-  first  seen on cxr RLL 10/14/13 > tickle file for CT 11/23/13  >CT chest 12/16/13 --Multiple bilateral pulmonary nodules measuring up to 8 mm in the right lower lobe, as seen on recent chest x-ray. >repeat CT ~06/2014 >>  She is not a candidate for any kind of bx or treatment given severity of dz and size of these nodules, though any could prove to be ca, discussed  Placed in tickle file for recall in 6 m

## 2013-12-21 NOTE — Assessment & Plan Note (Signed)
-   d/c on 3lpm 10/15/13 - 10/23/2013   Walked RA x one lap @ 185 stopped due to  desat to 87%  With sob at a nl pace at end of walk  As of 10/23/2013 rec 02 3lpm at hs and walking outside her house but not needed at rest  

## 2013-12-21 NOTE — Assessment & Plan Note (Signed)
-   d/c on 3lpm 10/15/13 - 10/23/2013   Walked RA x one lap @ 185 stopped due to  desat to 87%  With sob at a nl pace at end of walk  As of 10/23/2013 rec 02 3lpm at hs and walking outside her house but not needed at rest

## 2013-12-23 ENCOUNTER — Telehealth: Payer: Self-pay | Admitting: Internal Medicine

## 2013-12-23 NOTE — Telephone Encounter (Signed)
lmtcb X1 for pt  

## 2013-12-24 NOTE — Telephone Encounter (Signed)
Called and spoke to pt. Informed pt of the recs per MW. Pt verbalized understanding and denied any further questions or concerns at this time.   

## 2013-12-24 NOTE — Telephone Encounter (Signed)
Yes humidity best  rx - also should hold asa whenever notices any bleeding and continue to hold until no blood x 3 days

## 2013-12-24 NOTE — Telephone Encounter (Signed)
Spoke with pt, advised her of ct results.   Pt also c/o nosebleeds on R nostril only first thing in a.m.  Happened this morning and one day last week. States her nose feels sore and dry.  She states that she has not been using the humidity on her 02, believes that will help.  Pt wants to know if there are any other recommendations for this.  Dr. Sherene Sires please advise.  Thanks!

## 2014-01-06 ENCOUNTER — Other Ambulatory Visit: Payer: Self-pay | Admitting: Internal Medicine

## 2014-01-07 ENCOUNTER — Telehealth: Payer: Self-pay | Admitting: Internal Medicine

## 2014-01-07 ENCOUNTER — Telehealth: Payer: Self-pay

## 2014-01-07 MED ORDER — ATORVASTATIN CALCIUM 20 MG PO TABS: 20.0000 mg | ORAL_TABLET | Freq: Every day | ORAL | Status: DC

## 2014-01-07 NOTE — Telephone Encounter (Signed)
Patient called requesting refill on her lipitor Prescription sent to [facility] on file

## 2014-01-07 NOTE — Telephone Encounter (Signed)
Pt calling to follow up on cholesterol med refill request her pharmacy sent last week. Please f/u with pt, she took her last tablet yesterday.

## 2014-01-27 ENCOUNTER — Ambulatory Visit: Payer: Medicaid Other | Admitting: Internal Medicine

## 2014-01-29 ENCOUNTER — Emergency Department (HOSPITAL_COMMUNITY)
Admission: EM | Admit: 2014-01-29 | Discharge: 2014-01-29 | Disposition: A | Discharge status: Home / Self Care | Payer: Medicaid Other | Attending: Emergency Medicine | Admitting: Emergency Medicine

## 2014-01-29 ENCOUNTER — Emergency Department (HOSPITAL_COMMUNITY): Payer: Medicaid Other

## 2014-01-29 ENCOUNTER — Encounter (HOSPITAL_COMMUNITY): Payer: Self-pay

## 2014-01-29 DIAGNOSIS — Z7951 Long term (current) use of inhaled steroids: Secondary | ICD-10-CM | POA: Insufficient documentation

## 2014-01-29 DIAGNOSIS — I10 Essential (primary) hypertension: Secondary | ICD-10-CM | POA: Diagnosis not present

## 2014-01-29 DIAGNOSIS — Z720 Tobacco use: Secondary | ICD-10-CM | POA: Diagnosis not present

## 2014-01-29 DIAGNOSIS — Z7982 Long term (current) use of aspirin: Secondary | ICD-10-CM | POA: Diagnosis not present

## 2014-01-29 DIAGNOSIS — E780 Pure hypercholesterolemia: Secondary | ICD-10-CM | POA: Insufficient documentation

## 2014-01-29 DIAGNOSIS — Z79899 Other long term (current) drug therapy: Secondary | ICD-10-CM | POA: Diagnosis not present

## 2014-01-29 DIAGNOSIS — M25531 Pain in right wrist: Secondary | ICD-10-CM | POA: Diagnosis not present

## 2014-01-29 DIAGNOSIS — J449 Chronic obstructive pulmonary disease, unspecified: Secondary | ICD-10-CM | POA: Diagnosis not present

## 2014-01-29 DIAGNOSIS — M79641 Pain in right hand: Secondary | ICD-10-CM | POA: Diagnosis present

## 2014-01-29 MED ORDER — OXYCODONE-ACETAMINOPHEN 5-325 MG PO TABS: 1.0000 | ORAL_TABLET | ORAL | Status: DC | PRN

## 2014-01-29 NOTE — ED Notes (Signed)
Per reports right hand pain that started yesterday.  Reports pain starts in wrist and radiates up to fingers.  Pain described as throbbing in nature. Pain 9/10.

## 2014-01-29 NOTE — Discharge Instructions (Signed)
Take the prescribed medication as directed.  Wear your brace at home to help support wrist. Follow-up with your primary care physician. Return to the ED for new or worsening symptoms.

## 2014-01-29 NOTE — ED Provider Notes (Signed)
CSN: [id number]     Arrival date & time 01/29/14  1149 History  This chart was scribed for non-physician practitioner, Sharilyn Sites, PA-C working with Enid Skeens, MD by Luisa Dago, ED scribe. This patient was seen in room TR05C/TR05C and the patient's care was started at 12:48 PM.    Chief Complaint  Patient presents with  . Hand Pain   The history is provided by the patient and medical records. No language interpreter was used.   HPI Comments: Judith Scott is a 59 y.o. female with a PMhx of COPD and HTN listed below presents to the Emergency Department complaining of sudden onset worsening right hand pain that started yesterday. She states that the pain starts in right wrist and radiates down to her fingers. Pt describes the pain as "throbbing" in nature and rates it as a "9/10". Denies any recent injury, however she does recall cleaning a couple of heavy plates yesterday, but unsure. Pt denies fever, neck pain, sore throat, visual disturbance, CP, cough, SOB, abdominal pain, nausea, emesis, diarrhea, urinary symptoms, back pain, HA, weakness, numbness and rash as associated symptoms.     Past Medical History  Diagnosis Date  . COPD (chronic obstructive pulmonary disease)   . Hypercholesteremia   . COPD exacerbation 05/25/2012  . Hypertension    Past Surgical History  Procedure Laterality Date  . Appendectomy     Family History  Problem Relation Age of Onset  . 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 Smoking status: Current Every Day Smoker -- 1.50 packs/day for 40 years    Types: Cigarettes  . Smokeless tobacco: Never Used     Comment: smoking 10-11 cigs/day  . Alcohol Use: No   OB History    No data available     Review of Systems  Constitutional: Negative for fever and chills.  HENT: Negative for congestion.   Respiratory: Negative for shortness of breath.   Musculoskeletal: Positive for arthralgias.  Negative for myalgias and joint swelling.  Neurological: Negative for weakness and numbness.   Allergies  Review of patient's allergies indicates no known allergies.  Home Medications   Prior to Admission medications   Medication Sig Start Date End Date Taking? Authorizing Provider  albuterol (PROVENTIL) (5 MG/ML) 0.5% nebulizer solution Take 2.5 mg by nebulization every 6 (six) hours as needed for wheezing or shortness of breath.    Historical Provider, MD  aspirin 81 MG chewable tablet Chew 81 mg by mouth daily.    Historical Provider, MD  atorvastatin (LIPITOR) 20 MG tablet Take 1 tablet (20 mg total) by mouth daily. 01/07/14   Ambrose Finland, NP  budesonide-formoterol (SYMBICORT) 160-4.5 MCG/ACT inhaler Inhale 2 puffs into the lungs 2 (two) times daily. 11/11/13   Tammy S Parrett, NP  Cholecalciferol (VITAMIN D PO) Take 1 tablet by mouth daily.    Historical Provider, MD  lisinopril (PRINIVIL,ZESTRIL) 5 MG tablet Take 1 tablet (5 mg total) by mouth daily. 10/18/13   Ambrose Finland, NP  PROAIR HFA 108 (90 BASE) MCG/ACT inhaler INHALE 2 PUFFS BY MOUTH INTO THE LUNGS EVERY 6 HOURS AS NEEDED FOR WHEEZING OR SHORTNESS OF BREATH 10/31/13   Doris Cheadle, MD  SPIRIVA HANDIHALER 18 MCG inhalation capsule PLACE 1 CAPSULE (18 MCG TOTAL) INTO INHALER AND INHALE DAILY. 01/08/14   Doris Cheadle, MD  tiotropium (SPIRIVA) 18 MCG inhalation capsule Place 18 mcg into inhaler and  inhale daily.    Historical Provider, MD   BP 112/58 mmHg  Pulse 71  Temp(Src) 98.9 F (37.2 C) (Oral)  Resp 20  SpO2 98%   Physical Exam  Constitutional: She is oriented to person, place, and time. She appears well-developed and well-nourished.  HENT:  Head: Normocephalic and atraumatic.  Mouth/Throat: Oropharynx is clear and moist.  Eyes: Conjunctivae and EOM are normal. Pupils are equal, round, and reactive to light.  Neck: Normal range of motion.  Cardiovascular: Normal rate, regular rhythm and normal heart sounds.    Pulmonary/Chest: Effort normal and breath sounds normal. No respiratory distress. She has no wheezes.  Abdominal: Soft. Bowel sounds are normal.  Musculoskeletal: Normal range of motion.  Right wrist non-tender but endorses throbbing pain extending into hand; pain worse with flexion/extension of fingers; no visible swelling or bony deformities; negative Tinel's and Phalen's test; full ROM maintained, mild pain noted; hand remains NVI  Neurological: She is alert and oriented to person, place, and time.  Skin: Skin is warm and dry.  Psychiatric: She has a normal mood and affect.  Nursing note and vitals reviewed.   ED Course  Procedures (including critical care time)   COORDINATION OF CARE: 12:50 PM- X-rays or right wrist ordered. Imaging results discussed with the pt. Will prescribe pain medicine. Pt advised of plan for treatment and pt agrees.  Labs Review Labs Reviewed - No data to display  Imaging Review Dg Wrist Complete Right  01/29/2014   CLINICAL DATA:  59 year old female with medial right wrist pain and finger numbness x3 days with no known injury. Initial encounter.  EXAM: RIGHT WRIST - COMPLETE 3+ VIEW  COMPARISON:  None.  FINDINGS: Bone mineralization is within normal limits for age. Distal radius and ulna intact. Carpal bone alignment within normal limits. Mild chronic appearing irregularity of the distal scaphoid (arrow), might be sequelae of remote trauma. Ulnar styloid intact. Joint spaces preserved. Visible metacarpals intact.  IMPRESSION: No acute osseous abnormality identified at the right wrist.   Electronically Signed   By: Augusto Gamble M.D.   On: 01/29/2014 12:32     EKG Interpretation None      MDM   Final diagnoses:  Right hand pain  Right wrist pain   59 year old female with right wrist and hand pain after pulling several heavily plates other yesterday. No noted trauma. No numbness or paresthesias.  Imaging negative for acute bony abnormalities. Patient with  likely tendinitis given repetitive motions. She has a wrist brace that she will wear home, Rx percocet.  Patient to follow-up with PCP.  Discussed plan with patient, he/she acknowledged understanding and agreed with plan of care.  Return precautions given for new or worsening symptoms.  I personally performed the services described in this documentation, which was scribed in my presence. The recorded information has been reviewed and is accurate.  Garlon Hatchet, PA-C 01/29/14 1335  Enid Skeens, MD 01/29/14 [phone]

## 2014-02-03 ENCOUNTER — Other Ambulatory Visit: Payer: Self-pay | Admitting: Internal Medicine

## 2014-02-05 ENCOUNTER — Ambulatory Visit: Payer: Medicaid Other | Attending: Internal Medicine | Admitting: Internal Medicine

## 2014-02-05 ENCOUNTER — Encounter: Payer: Self-pay | Admitting: Internal Medicine

## 2014-02-05 VITALS — BP 144/78 | HR 65 | Temp 98.4°F | Resp 16 | Ht 64.0 in | Wt 135.0 lb

## 2014-02-05 DIAGNOSIS — Z79899 Other long term (current) drug therapy: Secondary | ICD-10-CM | POA: Diagnosis not present

## 2014-02-05 DIAGNOSIS — I10 Essential (primary) hypertension: Secondary | ICD-10-CM | POA: Diagnosis not present

## 2014-02-05 DIAGNOSIS — J449 Chronic obstructive pulmonary disease, unspecified: Secondary | ICD-10-CM | POA: Insufficient documentation

## 2014-02-05 DIAGNOSIS — Z720 Tobacco use: Secondary | ICD-10-CM

## 2014-02-05 DIAGNOSIS — E785 Hyperlipidemia, unspecified: Secondary | ICD-10-CM | POA: Diagnosis not present

## 2014-02-05 DIAGNOSIS — F17200 Nicotine dependence, unspecified, uncomplicated: Secondary | ICD-10-CM | POA: Insufficient documentation

## 2014-02-05 DIAGNOSIS — Z7982 Long term (current) use of aspirin: Secondary | ICD-10-CM | POA: Diagnosis not present

## 2014-02-05 MED ORDER — LOSARTAN POTASSIUM 25 MG PO TABS: 25.0000 mg | ORAL_TABLET | Freq: Every day | ORAL | Status: DC

## 2014-02-05 MED ORDER — ATORVASTATIN CALCIUM 20 MG PO TABS: 20.0000 mg | ORAL_TABLET | Freq: Every day | ORAL | Status: DC

## 2014-02-05 NOTE — Progress Notes (Signed)
Pt is here following up on her HTN, hyperlipidemia and her COPD.

## 2014-02-05 NOTE — Progress Notes (Signed)
Patient ID: Judith Scott, female   DOB: 1955/05/10, 59 y.o.   MRN: [id number] Subjective:  Judith Scott is a 59 y.o. female with hypertension. Patient reports that her Pulmonologist request that we change her BP medication to a different class of drug due to COPD exacerbations. She is currently taking several inhalers and using oxygen at night for management.  Current Outpatient Prescriptions  Medication Sig Dispense Refill  . albuterol (PROVENTIL) (5 MG/ML) 0.5% nebulizer solution Take 2.5 mg by nebulization every 6 (six) hours as needed for wheezing or shortness of breath.    Marland Kitchen aspirin 81 MG chewable tablet Chew 81 mg by mouth daily.    Marland Kitchen atorvastatin (LIPITOR) 20 MG tablet Take 1 tablet (20 mg total) by mouth daily. 30 tablet 0  . budesonide-formoterol (SYMBICORT) 160-4.5 MCG/ACT inhaler Inhale 2 puffs into the lungs 2 (two) times daily. 1 Inhaler 6  . Cholecalciferol (VITAMIN D PO) Take 1 tablet by mouth daily.    Marland Kitchen lisinopril (PRINIVIL,ZESTRIL) 5 MG tablet Take 1 tablet (5 mg total) by mouth daily. 90 tablet 3  . oxyCODONE-acetaminophen (PERCOCET/ROXICET) 5-325 MG per tablet Take 1 tablet by mouth every 4 (four) hours as needed. 15 tablet 0  . PROAIR HFA 108 (90 BASE) MCG/ACT inhaler INHALE 2 PUFFS BY MOUTH INTO THE LUNGS EVERY 6 HOURS AS NEEDED FOR WHEEZING OR SHORTNESS OF BREATH 8.5 each 2  . SPIRIVA HANDIHALER 18 MCG inhalation capsule PLACE 1 CAPSULE (18 MCG TOTAL) INTO INHALER AND INHALE DAILY. 1 capsule 2  . tiotropium (SPIRIVA) 18 MCG inhalation capsule Place 18 mcg into inhaler and inhale daily.     No current facility-administered medications for this visit.    Hypertension ROS: taking medications as instructed, no medication side effects noted, no TIA's, no chest pain on exertion, notes stable dyspnea on exertion, no change, no swelling of ankles and no palpitations.  New concerns: right hand cramping and feet.   Objective:  BP 144/78 mmHg  Pulse 65  Temp(Src) 98.4 F (36.9 C)  (Oral)  Resp 16  Ht 5\' 4"  (1.626 m)  Wt 135 lb (61.236 kg)  BMI 23.16 kg/m2  SpO2 96%  Appearance alert, well appearing, and in no distress and oriented to person, place, and time. General exam BP noted to be mildly elevated today in office, S1, S2 normal, no gallop, no murmur, chest clear, no JVD, no HSM, no edema, CVS exam  - normal rate, regular rhythm, normal S1, S2, no murmurs, rubs, clicks or gallops, normal bilateral carotid upstroke without bruits, no JVD.  Lab review: orders written for new lab studies as appropriate; see orders.   Assessment:   Hypertension needs improvement, needs to quit smoking, needs to follow diet more regularly.   Plan:  Recommended sodium restriction. Very strongly urged to quit smoking to reduce cardiovascular risk. Cardiovascular risk and specific lipid/LDL goals reviewed. The following changes are to be made: discontinue Lisinopril and begin Losartan 25 mg QD Follow up: 2 weeks for a RN visit-BP check.  , NP 02/05/2014 11:16 PM

## 2014-02-05 NOTE — Patient Instructions (Signed)
Smoking Cessation Quitting smoking is important to your health and has many advantages. However, it is not always easy to quit since nicotine is a very addictive drug. Oftentimes, people try 3 times or more before being able to quit. This document explains the best ways for you to prepare to quit smoking. Quitting takes hard work and a lot of effort, but you can do it. ADVANTAGES OF QUITTING SMOKING  You will live longer, feel better, and live better.  Your body will feel the impact of quitting smoking almost immediately.  Within 20 minutes, blood pressure decreases. Your pulse returns to its normal level.  After 8 hours, carbon monoxide levels in the blood return to normal. Your oxygen level increases.  After 24 hours, the chance of having a heart attack starts to decrease. Your breath, hair, and body stop smelling like smoke.  After 48 hours, damaged nerve endings begin to recover. Your sense of taste and smell improve.  After 72 hours, the body is virtually free of nicotine. Your bronchial tubes relax and breathing becomes easier.  After 2 to 12 weeks, lungs can hold more air. Exercise becomes easier and circulation improves.  The risk of having a heart attack, stroke, cancer, or lung disease is greatly reduced.  After 1 year, the risk of coronary heart disease is cut in half.  After 5 years, the risk of stroke falls to the same as a nonsmoker.  After 10 years, the risk of lung cancer is cut in half and the risk of other cancers decreases significantly.  After 15 years, the risk of coronary heart disease drops, usually to the level of a nonsmoker.  If you are pregnant, quitting smoking will improve your chances of having a healthy baby.  The people you live with, especially any children, will be healthier.  You will have extra money to spend on things other than cigarettes. QUESTIONS TO THINK ABOUT BEFORE ATTEMPTING TO QUIT You may want to talk about your answers with your  health care provider.  Why do you want to quit?  If you tried to quit in the past, what helped and what did not?  What will be the most difficult situations for you after you quit? How will you plan to handle them?  Who can help you through the tough times? Your family? Friends? A health care provider?  What pleasures do you get from smoking? What ways can you still get pleasure if you quit? Here are some questions to ask your health care provider:  How can you help me to be successful at quitting?  What medicine do you think would be best for me and how should I take it?  What should I do if I need more help?  What is smoking withdrawal like? How can I get information on withdrawal? GET READY  Set a quit date.  Change your environment by getting rid of all cigarettes, ashtrays, matches, and lighters in your home, car, or work. Do not let people smoke in your home.  Review your past attempts to quit. Think about what worked and what did not. GET SUPPORT AND ENCOURAGEMENT You have a better chance of being successful if you have help. You can get support in many ways.  Tell your family, friends, and coworkers that you are going to quit and need their support. Ask them not to smoke around you.  Get individual, group, or telephone counseling and support. Programs are available at local hospitals and health centers. Call   your local health department for information about programs in your area.  Spiritual beliefs and practices may help some smokers quit.  Download a "quit meter" on your computer to keep track of quit statistics, such as how long you have gone without smoking, cigarettes not smoked, and money saved.  Get a self-help book about quitting smoking and staying off tobacco. LEARN NEW SKILLS AND BEHAVIORS  Distract yourself from urges to smoke. Talk to someone, go for a walk, or occupy your time with a task.  Change your normal routine. Take a different route to work.  Drink tea instead of coffee. Eat breakfast in a different place.  Reduce your stress. Take a hot bath, exercise, or read a book.  Plan something enjoyable to do every day. Reward yourself for not smoking.  Explore interactive web-based programs that specialize in helping you quit. GET MEDICINE AND USE IT CORRECTLY Medicines can help you stop smoking and decrease the urge to smoke. Combining medicine with the above behavioral methods and support can greatly increase your chances of successfully quitting smoking.  Nicotine replacement therapy helps deliver nicotine to your body without the negative effects and risks of smoking. Nicotine replacement therapy includes nicotine gum, lozenges, inhalers, nasal sprays, and skin patches. Some may be available over-the-counter and others require a prescription.  Antidepressant medicine helps people abstain from smoking, but how this works is unknown. This medicine is available by prescription.  Nicotinic receptor partial agonist medicine simulates the effect of nicotine in your brain. This medicine is available by prescription. Ask your health care provider for advice about which medicines to use and how to use them based on your health history. Your health care provider will tell you what side effects to look out for if you choose to be on a medicine or therapy. Carefully read the information on the package. Do not use any other product containing nicotine while using a nicotine replacement product.  RELAPSE OR DIFFICULT SITUATIONS Most relapses occur within the first 3 months after quitting. Do not be discouraged if you start smoking again. Remember, most people try several times before finally quitting. You may have symptoms of withdrawal because your body is used to nicotine. You may crave cigarettes, be irritable, feel very hungry, cough often, get headaches, or have difficulty concentrating. The withdrawal symptoms are only temporary. They are strongest  when you first quit, but they will go away within 10-14 days. To reduce the chances of relapse, try to:  Avoid drinking alcohol. Drinking lowers your chances of successfully quitting.  Reduce the amount of caffeine you consume. Once you quit smoking, the amount of caffeine in your body increases and can give you symptoms, such as a rapid heartbeat, sweating, and anxiety.  Avoid smokers because they can make you want to smoke.  Do not let weight gain distract you. Many smokers will gain weight when they quit, usually less than 10 pounds. Eat a healthy diet and stay active. You can always lose the weight gained after you quit.  Find ways to improve your mood other than smoking. FOR MORE INFORMATION  www.smokefree.gov  Document Released: 12/14/2000 Document Revised: 05/06/2013 Document Reviewed: 03/31/2011 ExitCare Patient Information 2015 [employer]. This information is not intended to replace advice given to you by your health care provider. Make sure you discuss any questions you have with your health care provider.  

## 2014-02-24 ENCOUNTER — Ambulatory Visit: Payer: Medicaid Other | Attending: Internal Medicine | Admitting: *Deleted

## 2014-02-24 VITALS — BP 115/66 | HR 66 | Temp 97.8°F | Resp 18

## 2014-02-24 DIAGNOSIS — E785 Hyperlipidemia, unspecified: Secondary | ICD-10-CM | POA: Insufficient documentation

## 2014-02-24 LAB — LIPID PANEL
Cholesterol: 184 mg/dL (ref 0–200)
HDL: 53 mg/dL (ref >=46)
LDL CALC: 108 mg/dL — AB (ref 0–99)
Total CHOL/HDL Ratio: 3.5 Ratio
Triglycerides: 117 mg/dL (ref <150)
VLDL: 23 mg/dL (ref 0–40)

## 2014-02-24 NOTE — Patient Instructions (Signed)
DASH Eating Plan °DASH stands for "Dietary Approaches to Stop Hypertension." The DASH eating plan is a healthy eating plan that has been shown to reduce high blood pressure (hypertension). Additional health benefits may include reducing the risk of type 2 diabetes mellitus, heart disease, and stroke. The DASH eating plan may also help with weight loss. °WHAT DO I NEED TO KNOW ABOUT THE DASH EATING PLAN? °For the DASH eating plan, you will follow these general guidelines: °· Choose foods with a percent daily value for sodium of less than 5% (as listed on the food label). °· Use salt-free seasonings or herbs instead of table salt or sea salt. °· Check with your health care provider or pharmacist before using salt substitutes. °· Eat lower-sodium products, often labeled as "lower sodium" or "no salt added." °· Eat fresh foods. °· Eat more vegetables, fruits, and low-fat dairy products. °· Choose whole grains. Look for the word "whole" as the first word in the ingredient list. °· Choose fish and skinless chicken or turkey more often than red meat. Limit fish, poultry, and meat to 6 oz (170 g) each day. °· Limit sweets, desserts, sugars, and sugary drinks. °· Choose heart-healthy fats. °· Limit cheese to 1 oz (28 g) per day. °· Eat more home-cooked food and less restaurant, buffet, and fast food. °· Limit fried foods. °· Cook foods using methods other than frying. °· Limit canned vegetables. If you do use them, rinse them well to decrease the sodium. °· When eating at a restaurant, ask that your food be prepared with less salt, or no salt if possible. °WHAT FOODS CAN I EAT? °Seek help from a dietitian for individual calorie needs. °Grains °Whole grain or whole wheat bread. Brown rice. Whole grain or whole wheat pasta. Quinoa, bulgur, and whole grain cereals. Low-sodium cereals. Corn or whole wheat flour tortillas. Whole grain cornbread. Whole grain crackers. Low-sodium crackers. °Vegetables °Fresh or frozen vegetables  (raw, steamed, roasted, or grilled). Low-sodium or reduced-sodium tomato and vegetable juices. Low-sodium or reduced-sodium tomato sauce and paste. Low-sodium or reduced-sodium canned vegetables.  °Fruits °All fresh, canned (in natural juice), or frozen fruits. °Meat and Other Protein Products °Ground beef (85% or leaner), grass-fed beef, or beef trimmed of fat. Skinless chicken or turkey. Ground chicken or turkey. Pork trimmed of fat. All fish and seafood. Eggs. Dried beans, peas, or lentils. Unsalted nuts and seeds. Unsalted canned beans. °Dairy °Low-fat dairy products, such as skim or 1% milk, 2% or reduced-fat cheeses, low-fat ricotta or cottage cheese, or plain low-fat yogurt. Low-sodium or reduced-sodium cheeses. °Fats and Oils °Tub margarines without trans fats. Light or reduced-fat mayonnaise and salad dressings (reduced sodium). Avocado. Safflower, olive, or canola oils. Natural peanut or almond butter. °Other °Unsalted popcorn and pretzels. °The items listed above may not be a complete list of recommended foods or beverages. Contact your dietitian for more options. °WHAT FOODS ARE NOT RECOMMENDED? °Grains °White bread. White pasta. White rice. Refined cornbread. Bagels and croissants. Crackers that contain trans fat. °Vegetables °Creamed or fried vegetables. Vegetables in a cheese sauce. Regular canned vegetables. Regular canned tomato sauce and paste. Regular tomato and vegetable juices. °Fruits °Dried fruits. Canned fruit in light or heavy syrup. Fruit juice. °Meat and Other Protein Products °Fatty cuts of meat. Ribs, chicken wings, bacon, sausage, bologna, salami, chitterlings, fatback, hot dogs, bratwurst, and packaged luncheon meats. Salted nuts and seeds. Canned beans with salt. °Dairy °Whole or 2% milk, cream, half-and-half, and cream cheese. Whole-fat or sweetened yogurt. Full-fat   cheeses or blue cheese. Nondairy creamers and whipped toppings. Processed cheese, cheese spreads, or cheese  curds. °Condiments °Onion and garlic salt, seasoned salt, table salt, and sea salt. Canned and packaged gravies. Worcestershire sauce. Tartar sauce. Barbecue sauce. Teriyaki sauce. Soy sauce, including reduced sodium. Steak sauce. Fish sauce. Oyster sauce. Cocktail sauce. Horseradish. Ketchup and mustard. Meat flavorings and tenderizers. Bouillon cubes. Hot sauce. Tabasco sauce. Marinades. Taco seasonings. Relishes. °Fats and Oils °Butter, stick margarine, lard, shortening, ghee, and bacon fat. Coconut, palm kernel, or palm oils. Regular salad dressings. °Other °Pickles and olives. Salted popcorn and pretzels. °The items listed above may not be a complete list of foods and beverages to avoid. Contact your dietitian for more information. °WHERE CAN I FIND MORE INFORMATION? °National Heart, Lung, and Blood Institute: www.nhlbi.nih.gov/health/health-topics/topics/dash/ °Document Released: 12/09/2010 Document Revised: 05/06/2013 Document Reviewed: 10/24/2012 °ExitCare® Patient Information ©2015 [employer]. This information is not intended to replace advice given to you by your health care provider. Make sure you discuss any questions you have with your health care provider. ° °

## 2014-02-24 NOTE — Progress Notes (Signed)
Patient presents for fasting lipid panel and BP check after switching to losartan Med list reviewed; states taking all meds as directed Discussed need for low sodium diet and using Mrs. Dash as alternative to salt Discussed walking 30 minutes per day for exercise Patient denies headaches, blurred vision, SHOB, chest pain or pressure  BP 115/66 P 66 R 18  T  97.8 oral SPO2  92%  Patient advised to call for med refills at least 7 days before running out so as not to go without. Patient aware that she  is to f/u with PCP 3 months from last visit (Due 05/06/14)

## 2014-02-27 ENCOUNTER — Telehealth: Payer: Self-pay | Admitting: *Deleted

## 2014-02-27 NOTE — Telephone Encounter (Signed)
Pt aware of results 

## 2014-02-27 NOTE — Telephone Encounter (Signed)
-----   Message from Ambrose Finland, NP sent at 02/26/2014  8:09 PM EST ----- Cholesterol has greatly improved. Please continue to stay on the atorvastatin to lower risk for stroke and heart disease. Her LDL is still over what it should be so please educated on diet changes such as starches, butter, fried foods, etc and exercise. Thanks

## 2014-03-03 ENCOUNTER — Other Ambulatory Visit: Payer: Self-pay | Admitting: Internal Medicine

## 2014-03-18 ENCOUNTER — Ambulatory Visit (INDEPENDENT_AMBULATORY_CARE_PROVIDER_SITE_OTHER)
Admission: RE | Admit: 2014-03-18 | Discharge: 2014-03-18 | Disposition: A | Discharge status: Home / Self Care | Payer: Medicaid Other | Source: Ambulatory Visit | Attending: Internal Medicine | Admitting: Internal Medicine

## 2014-03-18 ENCOUNTER — Encounter: Payer: Self-pay | Admitting: Internal Medicine

## 2014-03-18 ENCOUNTER — Ambulatory Visit (INDEPENDENT_AMBULATORY_CARE_PROVIDER_SITE_OTHER): Payer: Medicaid Other | Admitting: Internal Medicine

## 2014-03-18 VITALS — BP 140/80 | HR 71 | Ht 64.0 in | Wt 137.0 lb

## 2014-03-18 DIAGNOSIS — F17210 Nicotine dependence, cigarettes, uncomplicated: Secondary | ICD-10-CM

## 2014-03-18 DIAGNOSIS — Z720 Tobacco use: Secondary | ICD-10-CM | POA: Diagnosis not present

## 2014-03-18 DIAGNOSIS — R911 Solitary pulmonary nodule: Secondary | ICD-10-CM

## 2014-03-18 DIAGNOSIS — J449 Chronic obstructive pulmonary disease, unspecified: Secondary | ICD-10-CM

## 2014-03-18 NOTE — Patient Instructions (Signed)
The key is to stop smoking completely before smoking completely stops you!   We will see you in June after your scan

## 2014-03-18 NOTE — Progress Notes (Signed)
Subjective:    Patient ID: Judith Scott, female    DOB: [birth year]  MRN: [id number]    Brief patient profile:  26 yowf  Active smoker dx with copd around 2010 using albuterol neb around twice daily but good activity tolerance then 2014 started on spriva then  x 2 weeks gradually worse > admit  Admit date: 10/13/2013  Discharge date: 10/15/2013    Discharge Diagnoses:  Acute respiratory failure with hypoxia secondary to COPD exacerbation  COPD exacerbation secondary to continued tobacco abuse which is by radiology  Tobacco abuse  Nodular opacity found on x-ray  Hyponatremia and hypochloremia  Hypertension  Hyperlipidemia       10/23/2013 1st  Pulmonary office visit/ Judith Scott / post hosp f/u 02 3lpm and no longer smoking  Chief Complaint  Patient presents with  . Pulmonary Consult    Referred by Dr. Luna Glasgow. Pt states dxed with COPD approx 5 yrs ago. She was had recent exacerbation and was admitted to hospital from 10/13/13 to 10/15/13. She states that her breathing is back to her normal baseline. She has not had to use her rescue inhaler and uses her albuterol neb 4 x per day.   back to baseline activity tol but 02 dep  Cough is clear/ slt yellow worse in ams Confused with how/ when to use saba with poor hfa and dpi so neb dependent  >rx symbicort and spiriva    11/06/2013 Follow up COPD/Chronic O2 depend RF /Lung nodule  Returns for follow up .   Advised per MW to stop Lisinopril, PCP told patient to remain on it but at lower dose - 5mg . Using O2 at night and PRN daytime.  Seen last ov for post hospital follow up .  Started on Symbicort . Remains on Spiriva  Needs PFTs Feels her breathing is better since discharge.  No flare in cough or wheezing  Gets winded with walking incline or long distance.  rec Continue to wear Oxygen 3 l/m with long distance walking and At bedtime  .  Return in 6 weeks with Dr.  And PFT .  Continue on working to quit smoking -most important  goal .  Discuss with family doctor regarding change your Lisinopril to alternative to due to cough.  Continue on Symbicort and Spiriva .  Rinse after all inhaler use.      12/18/2013 f/u ov/Judith Scott re: GOLD III / still smoking  Chief Complaint  Patient presents with  . Follow-up    PFT done today. She is using proair 3-4 x per wk, but has not needed neb. No new co's today.   once improved after aecopd resumed smoking. "just my usual bronchitis cough" worse in ams min white to beige mucus, clears up p first 30 min  Doe x > 100 yards, or if walks fast or does any inclines  rec The key is to stop smoking completely before smoking completely stops you!  Work on inhaler technique:       03/18/2014 f/u ov/Judith Scott re: copd GOLD III still smoking on symb/spiriva no neb saba any longer  Chief Complaint  Patient presents with  . Follow-up    CXR done today. Pt states that her breathing is overall doing well. She is using rescue inhaler 1 x per wk on average.    [place], cutting back on smoking but living in a trailer with Husband Smoking   No obvious day to day or daytime variabilty or assoc cough  Or cp or chest tightness, subjective wheeze overt sinus or hb symptoms. No unusual exp hx or h/o childhood pna/ asthma or knowledge of premature birth.  Sleeping ok without nocturnal  or early am exacerbation  of respiratory  c/o's or need for noct saba. Also denies any obvious fluctuation of symptoms with weather or environmental changes or other aggravating or alleviating factors except as outlined above   Current Medications, Allergies, Complete Past Medical History, Past Surgical History, Family History, and Social History were reviewed in [employer] record.  ROS  The following are not active complaints unless bolded sore throat, dysphagia, dental problems, itching, sneezing,  nasal congestion or excess/ purulent secretions, ear ache,   fever, chills,  sweats, unintended wt loss, pleuritic or exertional cp, hemoptysis,  orthopnea pnd or leg swelling, presyncope, palpitations, heartburn, abdominal pain, anorexia, nausea, vomiting, diarrhea  or change in bowel or urinary habits, change in stools or urine, dysuria,hematuria,  rash, arthralgias, visual complaints, headache, numbness weakness or ataxia or problems with walking or coordination,  change in mood/affect or memory.                       Objective:   Physical Exam        Wt Readings from Last 3 Encounters:  03/18/14 137 lb (62.143 kg)  02/05/14 135 lb (61.236 kg)  12/18/13 137 lb (62.143 kg)    Vital signs reviewed  amb wf with congested/ rattling cough   HEENT mild turbinate edema.  Oropharynx no thrush or excess pnd or cobblestoning.  No JVD or cervical adenopathy. Mild accessory muscle hypertrophy. Trachea midline, nl thryroid. Chest was hyperinflated by percussion with diminished breath sounds and moderate increased exp time without wheeze. Hoover sign positive at mid inspiration. Regular rate and rhythm without murmur gallop or rub or increase P2 or edema.  Abd: no hsm, nl excursion. Ext warm without cyanosis or clubbing.     cxr 10/14/13  1. Persistent small 8 mm right lower lung nodule, radiographically  new since 2014. Recommend followup chest CT (e.g. as outpatient) to  characterize and establish a CT baseline.  2. Suspect progression of chronic pulmonary interstitial changes  since 2014. Chronic hyperinflation.    I personally reviewed images and agree with radiology impression as follows:  CXR:  03/18/14 Irregular density is noted laterally in right lung base which may correspond to nodular density described on prior exam. Follow-up chest radiograph in 6 months is recommended to ensure stability.       Assessment & Plan:

## 2014-03-19 NOTE — Progress Notes (Signed)
Quick Note:  Spoke with pt and notified of results per Dr. Wert. Pt verbalized understanding and denied any questions.  ______ 

## 2014-03-19 NOTE — Assessment & Plan Note (Signed)
-  first  seen on cxr RLL 10/14/13 > tickle file for CT 11/23/13  >CT chest 12/16/13 --Multiple bilateral pulmonary nodules measuring up to 8 mm in the right lower lobe, as seen on recent chest x-ray. >repeat CT ~06/2014 >>  Discussed in detail all the  indications, usual  risks and alternatives  relative to the benefits with patient who agrees to proceed with ct as planned - she is a very poor candidate for aggressive rx as this would required at the minimum a segmentectomy in the RLL which is the largest contributor to her already limited FEV1 .

## 2014-03-19 NOTE — Assessment & Plan Note (Signed)
10/23/2013   try symbicort 160 2bid/spiriva and prn saba 12/17/13 PFTs  FEV1  0.79 (30%) ratio 50 p no sign change from saba and DLOC 38%  12/18/2013 p extensive coaching HFA effectiveness =    75%    I had an extended discussion with the patient reviewing all relevant studies completed to date and  lasting 15 to 20 minutes of a 25 minute visit on the following ongoing concerns:  1) the medications have done all they can at this point   2) the make or break issue for her is whether she is able to stop smoking and maintain abstinence in the setting of her husband smoking in the trailer where they live (very tight air circulation/ temptation issues removed).

## 2014-03-19 NOTE — Assessment & Plan Note (Signed)
>   3 m  I took an extended  opportunity with this patient to outline the consequences of continued cigarette use  in airway disorders based on all the data we have from the multiple national lung health studies (perfomed over decades at millions of dollars in cost)  indicating that smoking cessation, not choice of inhalers or physicians, is the most important aspect of care.   

## 2014-03-24 ENCOUNTER — Other Ambulatory Visit: Payer: Self-pay | Admitting: Internal Medicine

## 2014-03-24 DIAGNOSIS — Z1231 Encounter for screening mammogram for malignant neoplasm of breast: Secondary | ICD-10-CM

## 2014-03-31 ENCOUNTER — Other Ambulatory Visit: Payer: Self-pay | Admitting: Internal Medicine

## 2014-04-09 ENCOUNTER — Other Ambulatory Visit: Payer: Self-pay | Admitting: Internal Medicine

## 2014-04-11 ENCOUNTER — Other Ambulatory Visit: Payer: Self-pay | Admitting: Physician Assistant

## 2014-04-11 MED ORDER — TIOTROPIUM BROMIDE MONOHYDRATE 18 MCG IN CAPS: 18.0000 ug | ORAL_CAPSULE | Freq: Every day | RESPIRATORY_TRACT | Status: DC

## 2014-04-11 NOTE — Telephone Encounter (Signed)
Pt called requesting a refill on Spiriva. She is a pt of Mrs. Luna Glasgow, NP. Refill sent to [facility] [address] [facility] refill.

## 2014-04-15 ENCOUNTER — Ambulatory Visit (HOSPITAL_COMMUNITY)
Admission: RE | Admit: 2014-04-15 | Discharge: 2014-04-15 | Disposition: A | Discharge status: Home / Self Care | Payer: Medicaid Other | Source: Ambulatory Visit | Attending: Internal Medicine | Admitting: Internal Medicine

## 2014-04-15 DIAGNOSIS — Z1231 Encounter for screening mammogram for malignant neoplasm of breast: Secondary | ICD-10-CM | POA: Diagnosis present

## 2014-05-05 ENCOUNTER — Telehealth: Payer: Self-pay | Admitting: *Deleted

## 2014-05-05 NOTE — Telephone Encounter (Signed)
Pt is aware of her MM results. 

## 2014-05-05 NOTE — Telephone Encounter (Signed)
-----   Message from Ambrose Finland, NP sent at 04/23/2014 12:08 AM EDT ----- No evidence of malignancy on mammogram. Repeat in 1 year

## 2014-05-29 ENCOUNTER — Other Ambulatory Visit: Payer: Self-pay | Admitting: Adult Health

## 2014-06-04 ENCOUNTER — Encounter: Payer: Self-pay | Admitting: Internal Medicine

## 2014-06-04 ENCOUNTER — Ambulatory Visit: Payer: Medicaid Other | Attending: Internal Medicine | Admitting: Internal Medicine

## 2014-06-04 VITALS — BP 127/77 | HR 74 | Temp 98.5°F | Resp 18 | Ht 64.0 in | Wt 136.6 lb

## 2014-06-04 DIAGNOSIS — F17210 Nicotine dependence, cigarettes, uncomplicated: Secondary | ICD-10-CM | POA: Diagnosis not present

## 2014-06-04 DIAGNOSIS — E785 Hyperlipidemia, unspecified: Secondary | ICD-10-CM | POA: Diagnosis not present

## 2014-06-04 DIAGNOSIS — Z720 Tobacco use: Secondary | ICD-10-CM

## 2014-06-04 DIAGNOSIS — F17200 Nicotine dependence, unspecified, uncomplicated: Secondary | ICD-10-CM

## 2014-06-04 DIAGNOSIS — I10 Essential (primary) hypertension: Secondary | ICD-10-CM | POA: Diagnosis not present

## 2014-06-04 LAB — COMPLETE METABOLIC PANEL WITH GFR
ALBUMIN: 4 g/dL (ref 3.5–5.2)
ALT: 13 U/L (ref 0–35)
AST: 17 U/L (ref 0–37)
Alkaline Phosphatase: 54 U/L (ref 39–117)
BILIRUBIN TOTAL: 0.2 mg/dL (ref 0.2–1.2)
BUN: 6 mg/dL (ref 6–23)
CALCIUM: 9 mg/dL (ref 8.4–10.5)
CO2: 32 meq/L (ref 19–32)
CREATININE: 0.62 mg/dL (ref 0.50–1.10)
Chloride: 100 mEq/L (ref 96–112)
GFR, Est Non African American: >89 mL/min
GLUCOSE: 91 mg/dL (ref 70–99)
Potassium: 4.4 mEq/L (ref 3.5–5.3)
Sodium: 141 mEq/L (ref 135–145)
Total Protein: 6.5 g/dL (ref 6.0–8.3)

## 2014-06-04 MED ORDER — ATORVASTATIN CALCIUM 20 MG PO TABS: 20.0000 mg | ORAL_TABLET | Freq: Every day | ORAL | Status: DC

## 2014-06-04 MED ORDER — LOSARTAN POTASSIUM 25 MG PO TABS: 25.0000 mg | ORAL_TABLET | Freq: Every day | ORAL | Status: DC

## 2014-06-04 NOTE — Progress Notes (Signed)
Patient here for follow up for hypertension. Patient reports feeling fine. Patient reports that she has not been monitoring her blood pressure. Patient reports that her right foot has been bothering her and she thinks that it is due to a bunion, but it is no longer hurting. Patient denies pain at this time.

## 2014-06-04 NOTE — Progress Notes (Signed)
Patient ID: Judith Scott, female   DOB: April 03, 1955, 59 y.o.   MRN: [id number]  Subjective:  Vernon Ariel is a 59 y.o. female with hypertension and hyperlipidemia. Current Outpatient Prescriptions  Medication Sig Dispense Refill  . albuterol (PROVENTIL HFA;VENTOLIN HFA) 108 (90 BASE) MCG/ACT inhaler Inhale 2 puffs into the lungs every 4 (four) hours as needed for wheezing or shortness of breath.    Marland Kitchen albuterol (PROVENTIL) (5 MG/ML) 0.5% nebulizer solution Take 2.5 mg by nebulization every 6 (six) hours as needed for wheezing or shortness of breath.    Marland Kitchen aspirin 81 MG chewable tablet Chew 81 mg by mouth daily.    Marland Kitchen atorvastatin (LIPITOR) 20 MG tablet Take 1 tablet (20 mg total) by mouth daily. 30 tablet 4  . Cholecalciferol (VITAMIN D PO) Take 1 tablet by mouth daily.    Marland Kitchen losartan (COZAAR) 25 MG tablet Take 1 tablet (25 mg total) by mouth daily. 30 tablet 4  . SYMBICORT 160-4.5 MCG/ACT inhaler INHALE 2 PUFFS INTO THE LUNGS 2 (TWO) TIMES DAILY. 10.2 Inhaler 6  . tiotropium (SPIRIVA) 18 MCG inhalation capsule Place 1 capsule (18 mcg total) into inhaler and inhale daily. 30 capsule 1   No current facility-administered medications for this visit.    Hypertension ROS: taking medications as instructed, no medication side effects noted, no TIA's, no chest pain on exertion, no dyspnea on exertion, no swelling of ankles and no palpitations.  Cholesterol ROS: No myalgias or claudication New concerns: Bunion on her right foot that was hurting her but now is pain free since she filed it down. Patient reports that she has continued to smoke about 15 cigarettes per day. She uses 3 L of oxygen only at night. She states that she wears her nicotine patch occasionally.   Objective:  BP 127/77 mmHg  Pulse 74  Temp(Src) 98.5 F (36.9 C) (Oral)  Resp 18  Ht 5\' 4"  (1.626 m)  Wt 136 lb 9.6 oz (61.961 kg)  BMI 23.44 kg/m2  SpO2 94%  Appearance alert, well appearing, and in no distress, oriented to person, place,  and time and normal appearing weight. General exam BP noted to be well controlled today in office, S1, S2 normal, no gallop, no murmur, chest clear, no JVD, no HSM, no edema.  Lab review: orders written for new lab studies as appropriate; see orders.   Assessment:   Hypertension well controlled.   Plan:  Current treatment plan is effective, no change in therapy. Recommended sodium restriction. Very strongly urged to quit smoking to reduce cardiovascular risk. Copy of written low fat low cholesterol diet provided and reviewed.  Education provided on proper lifestyle changes in order to lower cholesterol. Patient advised to maintain healthy weight and to keep total fat intake at 25-35% of total calories and carbohydrates 50-60% of total daily calories. Explained how high cholesterol places patient at risk for heart disease. Patient placed on appropriate medication and repeat labs in 6 months  Smoking cessation discussed for 3 minutes, patient is not willing to quit at this time. Will continue to assess on each visit. Discussed increased risk for diseases such as cancer, heart disease, and stroke.   Return in about 6 months (around 12/04/2014) for Hypertension.   14/01/2014, NP 06/04/2014 12:33 PM

## 2014-06-06 ENCOUNTER — Telehealth: Payer: Self-pay | Admitting: *Deleted

## 2014-06-06 ENCOUNTER — Telehealth: Payer: Self-pay | Admitting: Internal Medicine

## 2014-06-06 NOTE — Telephone Encounter (Signed)
Patient is returning phone call from nurse in regards to results, please f/u

## 2014-06-06 NOTE — Telephone Encounter (Signed)
-----   Message from Ambrose Finland, NP sent at 06/04/2014 11:12 PM EDT ----- Labs are within normal limits

## 2014-06-06 NOTE — Telephone Encounter (Signed)
Left message for patient to call us back.  

## 2014-06-09 ENCOUNTER — Telehealth: Payer: Self-pay | Admitting: General Practice

## 2014-06-09 NOTE — Telephone Encounter (Signed)
Patient is returning phone call from nurse, please f/u °

## 2014-06-09 NOTE — Telephone Encounter (Signed)
Patient calling to request medication refill: lisinopril (PRINIVIL,ZESTRIL) 40 MG tablet  Please assist

## 2014-06-10 ENCOUNTER — Other Ambulatory Visit: Payer: Self-pay | Admitting: Internal Medicine

## 2014-06-10 NOTE — Telephone Encounter (Signed)
Patient is not on Lisinopril 40 mg, she should be on Losartan 25 mg. Please confirm with patient and refill her current medication. Thanks

## 2014-06-12 ENCOUNTER — Telehealth: Payer: Self-pay | Admitting: Internal Medicine

## 2014-06-12 NOTE — Telephone Encounter (Signed)
Pt is requesting a refill for tiotropium (SPIRIVA) 18 MCG inhalation capsule.  Please follow up with pt as pt is out. Thank you

## 2014-06-13 ENCOUNTER — Other Ambulatory Visit: Payer: Self-pay | Admitting: Internal Medicine

## 2014-06-13 ENCOUNTER — Telehealth: Payer: Self-pay | Admitting: Internal Medicine

## 2014-06-13 MED ORDER — TIOTROPIUM BROMIDE MONOHYDRATE 18 MCG IN CAPS: 18.0000 ug | ORAL_CAPSULE | Freq: Every day | RESPIRATORY_TRACT | Status: DC

## 2014-06-13 NOTE — Telephone Encounter (Signed)
Patient is calling to check on the status of her Spiriva 18 mcg inhalation capsule, patient is out of medication. Please f/u with pt.

## 2014-06-13 NOTE — Telephone Encounter (Signed)
Called patient to inform her that Judith Scott has been refilled and sent to pharmacy on file.

## 2014-06-18 ENCOUNTER — Ambulatory Visit (INDEPENDENT_AMBULATORY_CARE_PROVIDER_SITE_OTHER)
Admission: RE | Admit: 2014-06-18 | Discharge: 2014-06-18 | Disposition: A | Discharge status: Home / Self Care | Payer: Medicaid Other | Source: Ambulatory Visit | Attending: Adult Health | Admitting: Adult Health

## 2014-06-18 DIAGNOSIS — R911 Solitary pulmonary nodule: Secondary | ICD-10-CM | POA: Diagnosis not present

## 2014-06-20 NOTE — Progress Notes (Signed)
Quick Note:  Called and spoke with pt. Reviewed results and recs. Pt voiced understanding and had no further questions. ______ 

## 2014-06-24 ENCOUNTER — Ambulatory Visit (INDEPENDENT_AMBULATORY_CARE_PROVIDER_SITE_OTHER): Payer: Medicaid Other | Admitting: Internal Medicine

## 2014-06-24 ENCOUNTER — Encounter: Payer: Self-pay | Admitting: Internal Medicine

## 2014-06-24 VITALS — BP 146/84 | HR 74 | Ht 64.0 in | Wt 142.2 lb

## 2014-06-24 DIAGNOSIS — R911 Solitary pulmonary nodule: Secondary | ICD-10-CM

## 2014-06-24 DIAGNOSIS — J449 Chronic obstructive pulmonary disease, unspecified: Secondary | ICD-10-CM | POA: Diagnosis not present

## 2014-06-24 NOTE — Progress Notes (Signed)
Subjective:    Patient ID: Judith Scott, female    DOB: 1955-05-16  MRN: [id number]    Brief patient profile:  44 yowf  Active smoker dx with copd around 2010 using albuterol neb around twice daily but good activity tolerance then 2014 started on spriva then  x 2 weeks gradually worse > admit  Admit date: 10/13/2013  Discharge date: 10/15/2013    Discharge Diagnoses:  Acute respiratory failure with hypoxia secondary to COPD exacerbation  COPD exacerbation secondary to continued tobacco abuse which is by radiology  Tobacco abuse  Nodular opacity found on x-ray  Hyponatremia and hypochloremia  Hypertension  Hyperlipidemia       10/23/2013 1st [facility] office visit/ Suzana Sohail / post hosp f/u 02 3lpm and no longer smoking  Chief Complaint  Patient presents with  . Pulmonary Consult    Referred by Dr. Luna Glasgow. Pt states dxed with COPD approx 5 yrs ago. She was had recent exacerbation and was admitted to hospital from 10/13/13 to 10/15/13. She states that her breathing is back to her normal baseline. She has not had to use her rescue inhaler and uses her albuterol neb 4 x per day.   back to baseline activity tol but 02 dep  Cough is clear/ slt yellow worse in ams Confused with how/ when to use saba with poor hfa and dpi so neb dependent  >rx symbicort and spiriva    11/06/2013 Follow up COPD/Chronic O2 depend RF /Lung nodule  Returns for follow up .   Advised per MW to stop Lisinopril, PCP told patient to remain on it but at lower dose - 5mg . Using O2 at night and PRN daytime.  Seen last ov for post hospital follow up .  Started on Symbicort . Remains on Spiriva  Needs PFTs Feels her breathing is better since discharge.  No flare in cough or wheezing  Gets winded with walking incline or long distance.  rec Continue to wear Oxygen 3 l/m with long distance walking and At bedtime  .  Return in 6 weeks with Dr.  And PFT .  Continue on working to quit smoking -most important  goal .  Discuss with family doctor regarding change your Lisinopril to alternative to due to cough.  Continue on Symbicort and Spiriva .  Rinse after all inhaler use.      12/18/2013 f/u ov/Kush Farabee re: GOLD III / still smoking  Chief Complaint  Patient presents with  . Follow-up    PFT done today. She is using proair 3-4 x per wk, but has not needed neb. No new co's today.   once improved after aecopd resumed smoking. "just my usual bronchitis cough" worse in ams min white to beige mucus, clears up p first 30 min  Doe x > 100 yards, or if walks fast or does any inclines  rec The key is to stop smoking completely before smoking completely stops you!  Work on inhaler technique:       03/18/2014 f/u ov/Aimy Sweeting re: copd GOLD III still smoking on symb/spiriva no neb saba any longer  Chief Complaint  Patient presents with  . Follow-up    CXR done today. Pt states that her breathing is overall doing well. She is using rescue inhaler 1 x per wk on average.    [place], cutting back on smoking but living in a trailer with Husband Smoking  rec The key is to stop smoking completely before smoking completely stops  you!  We will see you in June after your scan   06/24/2014 f/u ov/Murvin Gift re: spns/ quit smoking x 2 weeks / spiriva/ symbicort / GOLD III  Copd  Chief Complaint  Patient presents with  . Follow-up    Pt states her breathing is unchanged. She c/o increased wheezing and cough for the past few days. Cough is prod with min clear sputum. She is using rescue inhaler 2-3 x per day and has not needed to use neb.   not needing neb/ walking flat nl pace no problem = [facility] 1    Acute problem is cough x sev days worse at hs and in am and overall a bit worse since d/c cigs     No obvious day to day or daytime variabilty or assoc   cp or chest tightness,  overt sinus or hb symptoms. No unusual exp hx or h/o childhood pna/ asthma or knowledge of premature birth.  Sleeping ok  without nocturnal  or early am exacerbation  of respiratory  c/o's or need for noct saba. Also denies any obvious fluctuation of symptoms with weather or environmental changes or other aggravating or alleviating factors except as outlined above   Current Medications, Allergies, Complete Past Medical History, Past Surgical History, Family History, and Social History were reviewed in [employer] record.  ROS  The following are not active complaints unless bolded sore throat, dysphagia, dental problems, itching, sneezing,  nasal congestion or excess/ purulent secretions, ear ache,   fever, chills, sweats, unintended wt loss, pleuritic or exertional cp, hemoptysis,  orthopnea pnd or leg swelling, presyncope, palpitations, heartburn, abdominal pain, anorexia, nausea, vomiting, diarrhea  or change in bowel or urinary habits, change in stools or urine, dysuria,hematuria,  rash, arthralgias, visual complaints, headache, numbness weakness or ataxia or problems with walking or coordination,  change in mood/affect or memory.            Objective:   Physical Exam        06/24/2014       142   Wt Readings from Last 3 Encounters:  03/18/14 137 lb (62.143 kg)  02/05/14 135 lb (61.236 kg)  12/18/13 137 lb (62.143 kg)    Vital signs reviewed  amb wf with congested/ rattling cough on FVC   HEENT mild turbinate edema.  Oropharynx no thrush or excess pnd or cobblestoning.  No JVD or cervical adenopathy. Mild accessory muscle hypertrophy. Trachea midline, nl thryroid. Chest was hyperinflated by percussion with diminished breath sounds and moderate increased exp time without wheeze. Hoover sign positive at mid inspiration. Regular rate and rhythm without murmur gallop or rub or increase P2 or edema.  Abd: no hsm, nl excursion. Ext warm without cyanosis or clubbing.     cxr 10/14/13  1. Persistent small 8 mm right lower lung nodule, radiographically  new since 2014. Recommend followup  chest CT (e.g. as outpatient) to  characterize and establish a CT baseline.  2. Suspect progression of chronic pulmonary interstitial changes  since 2014. Chronic hyperinflation.    I personally reviewed images and agree with radiology impression as follows:  CXR:  03/18/14 Irregular density is noted laterally in right lung base which may correspond to nodular density described on prior exam. Follow-up chest radiograph in 6 months is recommended to ensure stability.       Assessment & Plan:

## 2014-06-24 NOTE — Patient Instructions (Signed)
Work on maintaining perfect  inhaler technique:  relax and gently blow all the way out then take a nice smooth deep breath back in, triggering the inhaler at same time you start breathing in.  Hold for up to 5 seconds if you can.  Rinse and gargle with water when done  congrats on not smoking - this is the most important aspect of your care   If you are satisfied with your treatment plan,  let your doctor know and he/she can either refill your medications or you can return here when your prescription runs out.     If in any way you are not 100% satisfied,  please tell us.  If 100% better, tell your friends!  Pulmonary follow up is as needed

## 2014-06-25 ENCOUNTER — Encounter: Payer: Self-pay | Admitting: Internal Medicine

## 2014-06-25 NOTE — Assessment & Plan Note (Signed)
-  first  seen on cxr RLL 10/14/13 > tickle file for CT 11/23/13  >CT chest 12/16/13 --Multiple bilateral pulmonary nodules measuring up to 8 mm in the right lower lobe, as seen on recent chest x-ray. >repeat CT 06/18/2014 >>No suspicious lung nodule is seen.  Benign-appearing nodules appear completely stable . No mediastinal or hilar adenopathy is seen. >repeat CT chest ~06/2015  Tickle file for 06/18/15    Discussed in detail all the  indications, usual  risks and alternatives  relative to the benefits with patient who agrees to proceed repeat CT chest in one year as she is low but not zero risk .

## 2014-06-25 NOTE — Assessment & Plan Note (Signed)
10/23/2013   try symbicort 160 2bid/spiriva and prn saba 12/17/13 PFTs  FEV1  0.79 (30%) ratio 50 p no sign change from saba and DLOC 38%  - quit smoking 06/04/14 - 06/24/2014 p extensive coaching HFA effectiveness =    90% from a baseline 75%   I had an extended final summary discussion with the patient reviewing all relevant studies completed to date and  lasting 15 to 20 minutes of a 25 minute visit on the following issues:    1) she should gradually improve, esp with the am cough/ off cigs for 6 weeks but needs to maintain off and keep up with consistent symbicort /spiriva rx   2) Each maintenance medication was reviewed in detail including most importantly the difference between maintenance and as needed and under what circumstances the prns are to be used.  Please see instructions for details which were reviewed in writing and the patient given a copy.    3) pulmonary f/u is prn

## 2014-07-01 ENCOUNTER — Telehealth: Payer: Self-pay | Admitting: Internal Medicine

## 2014-07-01 NOTE — Telephone Encounter (Signed)
Pt states that her pharmacy, [facility] on [address], says that they are unable to refill medications for patient.  Pt says she thought she had refills on medications and is not sure why they are unable to refill. Please f/u with pt.

## 2014-07-08 NOTE — Telephone Encounter (Signed)
Nurse called patient, patient verified date of birth. Patient reports having all of her medications at this time. Spiriva will need to be refilled this week. Patient aware of 1 refill remaining.  Patient voices understanding and has no further questions at this time.

## 2014-07-09 ENCOUNTER — Other Ambulatory Visit: Payer: Self-pay | Admitting: Internal Medicine

## 2014-07-16 ENCOUNTER — Other Ambulatory Visit: Payer: Self-pay | Admitting: Internal Medicine

## 2014-07-29 ENCOUNTER — Telehealth: Payer: Self-pay | Admitting: Internal Medicine

## 2014-07-29 NOTE — Telephone Encounter (Signed)
Patient called to request a med refill for Pro hair inhaler, [facility] uses [facility] [address]. Please f/u

## 2014-08-03 ENCOUNTER — Other Ambulatory Visit: Payer: Self-pay | Admitting: Internal Medicine

## 2014-08-07 ENCOUNTER — Other Ambulatory Visit: Payer: Self-pay | Admitting: Internal Medicine

## 2014-08-11 NOTE — Telephone Encounter (Signed)
Patient called to request a med refill for tiotropium (SPIRIVA) 18 MCG inhalation capsule. [facility] uses [facility] [address]. Please f/u

## 2014-08-13 ENCOUNTER — Telehealth: Payer: Self-pay

## 2014-08-13 DIAGNOSIS — J441 Chronic obstructive pulmonary disease with (acute) exacerbation: Secondary | ICD-10-CM

## 2014-08-13 DIAGNOSIS — J449 Chronic obstructive pulmonary disease, unspecified: Secondary | ICD-10-CM

## 2014-08-13 DIAGNOSIS — J9611 Chronic respiratory failure with hypoxia: Secondary | ICD-10-CM

## 2014-08-13 MED ORDER — TIOTROPIUM BROMIDE MONOHYDRATE 18 MCG IN CAPS: 18.0000 ug | ORAL_CAPSULE | Freq: Every day | RESPIRATORY_TRACT | Status: DC

## 2014-08-13 NOTE — Telephone Encounter (Signed)
Nurse called patient, patient verified date of birth. Patient requesting Spiriva to be refilled.  Nurse will refill Spiriva.  Patient voices understanding and has no further questions at this time.

## 2014-09-09 ENCOUNTER — Encounter: Payer: Self-pay | Admitting: Internal Medicine

## 2014-09-09 ENCOUNTER — Ambulatory Visit: Payer: Medicaid Other | Attending: Internal Medicine | Admitting: Internal Medicine

## 2014-09-09 VITALS — BP 167/75 | HR 74 | Temp 98.8°F | Resp 16 | Ht 64.0 in | Wt 140.4 lb

## 2014-09-09 DIAGNOSIS — J449 Chronic obstructive pulmonary disease, unspecified: Secondary | ICD-10-CM | POA: Diagnosis not present

## 2014-09-09 DIAGNOSIS — Z720 Tobacco use: Secondary | ICD-10-CM | POA: Diagnosis not present

## 2014-09-09 DIAGNOSIS — Z9981 Dependence on supplemental oxygen: Secondary | ICD-10-CM

## 2014-09-09 NOTE — Progress Notes (Signed)
Patient here for follow up Patient states she received notice from [facility] on new Guidelines for oxygen Patient has the guidelines with her that need to be followed by medicaid Patient is currently on 3L oxygen at night

## 2014-09-09 NOTE — Patient Instructions (Signed)
Smoking Cessation Quitting smoking is important to your health and has many advantages. However, it is not always easy to quit since nicotine is a very addictive drug. Oftentimes, people try 3 times or more before being able to quit. This document explains the best ways for you to prepare to quit smoking. Quitting takes hard work and a lot of effort, but you can do it. ADVANTAGES OF QUITTING SMOKING  You will live longer, feel better, and live better.  Your body will feel the impact of quitting smoking almost immediately.  Within 20 minutes, blood pressure decreases. Your pulse returns to its normal level.  After 8 hours, carbon monoxide levels in the blood return to normal. Your oxygen level increases.  After 24 hours, the chance of having a heart attack starts to decrease. Your breath, hair, and body stop smelling like smoke.  After 48 hours, damaged nerve endings begin to recover. Your sense of taste and smell improve.  After 72 hours, the body is virtually free of nicotine. Your bronchial tubes relax and breathing becomes easier.  After 2 to 12 weeks, lungs can hold more air. Exercise becomes easier and circulation improves.  The risk of having a heart attack, stroke, cancer, or lung disease is greatly reduced.  After 1 year, the risk of coronary heart disease is cut in half.  After 5 years, the risk of stroke falls to the same as a nonsmoker.  After 10 years, the risk of lung cancer is cut in half and the risk of other cancers decreases significantly.  After 15 years, the risk of coronary heart disease drops, usually to the level of a nonsmoker.  If you are pregnant, quitting smoking will improve your chances of having a healthy baby.  The people you live with, especially any children, will be healthier.  You will have extra money to spend on things other than cigarettes. QUESTIONS TO THINK ABOUT BEFORE ATTEMPTING TO QUIT You may want to talk about your answers with your  health care provider.  Why do you want to quit?  If you tried to quit in the past, what helped and what did not?  What will be the most difficult situations for you after you quit? How will you plan to handle them?  Who can help you through the tough times? Your family? Friends? A health care provider?  What pleasures do you get from smoking? What ways can you still get pleasure if you quit? Here are some questions to ask your health care provider:  How can you help me to be successful at quitting?  What medicine do you think would be best for me and how should I take it?  What should I do if I need more help?  What is smoking withdrawal like? How can I get information on withdrawal? GET READY  Set a quit date.  Change your environment by getting rid of all cigarettes, ashtrays, matches, and lighters in your home, car, or work. Do not let people smoke in your home.  Review your past attempts to quit. Think about what worked and what did not. GET SUPPORT AND ENCOURAGEMENT You have a better chance of being successful if you have help. You can get support in many ways.  Tell your family, friends, and coworkers that you are going to quit and need their support. Ask them not to smoke around you.  Get individual, group, or telephone counseling and support. Programs are available at local hospitals and health centers. Call   your local health department for information about programs in your area.  Spiritual beliefs and practices may help some smokers quit.  Download a "quit meter" on your computer to keep track of quit statistics, such as how long you have gone without smoking, cigarettes not smoked, and money saved.  Get a self-help book about quitting smoking and staying off tobacco. LEARN NEW SKILLS AND BEHAVIORS  Distract yourself from urges to smoke. Talk to someone, go for a walk, or occupy your time with a task.  Change your normal routine. Take a different route to work.  Drink tea instead of coffee. Eat breakfast in a different place.  Reduce your stress. Take a hot bath, exercise, or read a book.  Plan something enjoyable to do every day. Reward yourself for not smoking.  Explore interactive web-based programs that specialize in helping you quit. GET MEDICINE AND USE IT CORRECTLY Medicines can help you stop smoking and decrease the urge to smoke. Combining medicine with the above behavioral methods and support can greatly increase your chances of successfully quitting smoking.  Nicotine replacement therapy helps deliver nicotine to your body without the negative effects and risks of smoking. Nicotine replacement therapy includes nicotine gum, lozenges, inhalers, nasal sprays, and skin patches. Some may be available over-the-counter and others require a prescription.  Antidepressant medicine helps people abstain from smoking, but how this works is unknown. This medicine is available by prescription.  Nicotinic receptor partial agonist medicine simulates the effect of nicotine in your brain. This medicine is available by prescription. Ask your health care provider for advice about which medicines to use and how to use them based on your health history. Your health care provider will tell you what side effects to look out for if you choose to be on a medicine or therapy. Carefully read the information on the package. Do not use any other product containing nicotine while using a nicotine replacement product.  RELAPSE OR DIFFICULT SITUATIONS Most relapses occur within the first 3 months after quitting. Do not be discouraged if you start smoking again. Remember, most people try several times before finally quitting. You may have symptoms of withdrawal because your body is used to nicotine. You may crave cigarettes, be irritable, feel very hungry, cough often, get headaches, or have difficulty concentrating. The withdrawal symptoms are only temporary. They are strongest  when you first quit, but they will go away within 10-14 days. To reduce the chances of relapse, try to:  Avoid drinking alcohol. Drinking lowers your chances of successfully quitting.  Reduce the amount of caffeine you consume. Once you quit smoking, the amount of caffeine in your body increases and can give you symptoms, such as a rapid heartbeat, sweating, and anxiety.  Avoid smokers because they can make you want to smoke.  Do not let weight gain distract you. Many smokers will gain weight when they quit, usually less than 10 pounds. Eat a healthy diet and stay active. You can always lose the weight gained after you quit.  Find ways to improve your mood other than smoking. FOR MORE INFORMATION  www.smokefree.gov  Document Released: 12/14/2000 Document Revised: 05/06/2013 Document Reviewed: 03/31/2011 ExitCare Patient Information 2015 [employer]. This information is not intended to replace advice given to you by your health care provider. Make sure you discuss any questions you have with your health care provider.  

## 2014-09-09 NOTE — Progress Notes (Signed)
Patient ID: Judith Scott, female   DOB: 1955-02-17, 59 y.o.   MRN: [id number]  CC: Oxygen recertification  HPI: Judith Scott is a 59 y.o. female here today for a follow up visit.  Patient has past medical history of COPD, HTN, and tobacco abuse. Patient reports that she received a letter from [facility] stating that is time for her two-year recertification and that she needs repeat office visit. Patient reports that she only uses her oxygen at nighttime to help her shortness of breath. Patient checks her oxygen saturation levels at home and they frequently drop as low as 79% while off oxygen at bedtime. Patient reports that she continues to smoke around 10 cigarettes per day which is a decrease from 1.5 packs per day. She states that next week for goal is to drop down to 5 cigarettes per day. She reports that she has not used her albuterol nebulizer in several weeks uses her pro-air inhaler 2-3 times per week with exertion.   Patient has No headache, No chest pain, No abdominal pain - No Nausea, No new weakness tingling or numbness.  No Known Allergies Past Medical History  Diagnosis Date  . COPD (chronic obstructive pulmonary disease)   . Hypercholesteremia   . COPD exacerbation 05/25/2012  . Hypertension    Current Outpatient Prescriptions on File Prior to Visit  Medication Sig Dispense Refill  . albuterol (PROVENTIL HFA;VENTOLIN HFA) 108 (90 BASE) MCG/ACT inhaler Inhale 2 puffs into the lungs every 4 (four) hours as needed for wheezing or shortness of breath.    Marland Kitchen albuterol (PROVENTIL) (5 MG/ML) 0.5% nebulizer solution Take 2.5 mg by nebulization every 6 (six) hours as needed for wheezing or shortness of breath.    Marland Kitchen aspirin 81 MG chewable tablet Chew 81 mg by mouth daily.    Marland Kitchen atorvastatin (LIPITOR) 20 MG tablet Take 1 tablet (20 mg total) by mouth daily. 30 tablet 4  . Cholecalciferol (VITAMIN D PO) Take 1 tablet by mouth daily.    Marland Kitchen losartan (COZAAR) 25 MG tablet Take 1 tablet (25  mg total) by mouth daily. 30 tablet 4  . SYMBICORT 160-4.5 MCG/ACT inhaler INHALE 2 PUFFS INTO THE LUNGS 2 (TWO) TIMES DAILY. 10.2 Inhaler 6  . tiotropium (SPIRIVA) 18 MCG inhalation capsule Place 1 capsule (18 mcg total) into inhaler and inhale daily. 30 capsule 5   No current facility-administered medications on file prior to visit.   Family History  Problem Relation Age of Onset  . 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 Marital Status: Legally Separated    Spouse Name: N/A  . Number of Children: N/A  . Years of Education: N/A   Occupational History  . Not on file.   Social History Main Topics  . Smoking status: Former Smoker -- 0.50 packs/day for 40 years    Types: Cigarettes    Quit date: 06/10/2014  . Smokeless tobacco: Never Used  . Alcohol Use: 0.0 oz/week    0 Standard drinks or equivalent per week     Comment: occasional  . Drug Use: No  . Sexual Activity: Yes    Birth Control/ Protection: Post-menopausal   Other Topics Concern  . Not on file   Social History Narrative    Review of Systems: Other than what is stated in HPI, all other systems are negative.   Objective:   Filed Vitals:   09/09/14 1433  BP: 167/75  Pulse: 74  Temp: 98.8 F (37.1 C)  Resp: 16    Physical Exam  Cardiovascular: Normal rate, regular rhythm and normal heart sounds.   No murmur heard. Pulmonary/Chest: Effort normal. No respiratory distress. She has wheezes.  Musculoskeletal: She exhibits no edema.  Skin: She is not diaphoretic.    Lab Results  Component Value Date   WBC 10.6* 10/15/2013   HGB 12.0 10/15/2013   HCT 36.1 10/15/2013   MCV 92.3 10/15/2013   PLT 273 10/15/2013   Lab Results  Component Value Date   CREATININE 0.62 06/04/2014   BUN 6 06/04/2014   NA 141 06/04/2014   K 4.4 06/04/2014   CL 100 06/04/2014   CO2 32 06/04/2014    No results found for: HGBA1C Lipid Panel     Component Value  Date/Time   CHOL 184 02/24/2014 0918   TRIG 117 02/24/2014 0918   HDL 53 02/24/2014 0918   CHOLHDL 3.5 02/24/2014 0918   VLDL 23 02/24/2014 0918   LDLCALC 108* 02/24/2014 0918       Assessment and plan:   Judith Scott was seen today for follow-up.  Diagnoses and all orders for this visit:  COPD GOLD III  Continue albuterol last refill. She is currently stable right now and may continue follow-up with pulmonology. Advised smoking cessation now.  On home oxygen therapy  Patient's saturation on room air resting is 91%. Patient desaturated to 86% with ambulation in office on room air. With patient on 3 L/m of oxygen with exertion her saturation came up to 97%. With patient's quick desaturation she is a candidate to continue home oxygen. Patient will continue to monitor herself with home pulse oximetry and has verbalized when to use oxygen.  Tobacco abuse disorder I have commended patient on her willingness to quit smoking. I have encouraged patient to continue with smoking cessation to prevent further lung damage. Cessation discussed with patient for 5 minutes.  Return in about 1 month (around 10/09/2014) for physical .      Ambrose Finland, NP-C [facility] [phone] 09/09/2014, 2:57 PM

## 2014-10-09 ENCOUNTER — Encounter: Payer: Medicaid Other | Admitting: Internal Medicine

## 2014-10-13 ENCOUNTER — Other Ambulatory Visit: Payer: Self-pay

## 2014-10-13 DIAGNOSIS — J449 Chronic obstructive pulmonary disease, unspecified: Secondary | ICD-10-CM

## 2014-10-13 DIAGNOSIS — J441 Chronic obstructive pulmonary disease with (acute) exacerbation: Secondary | ICD-10-CM

## 2014-10-13 DIAGNOSIS — J9611 Chronic respiratory failure with hypoxia: Secondary | ICD-10-CM

## 2014-10-13 MED ORDER — ALBUTEROL SULFATE HFA 108 (90 BASE) MCG/ACT IN AERS: 2.0000 | INHALATION_SPRAY | RESPIRATORY_TRACT | Status: DC | PRN

## 2014-10-13 NOTE — Telephone Encounter (Signed)
Nurse refilled ProAir inhaler at providers request.

## 2014-10-17 ENCOUNTER — Other Ambulatory Visit (HOSPITAL_COMMUNITY)
Admission: RE | Admit: 2014-10-17 | Discharge: 2014-10-17 | Disposition: A | Discharge status: Home / Self Care | Payer: Medicaid Other | Source: Ambulatory Visit | Attending: Internal Medicine | Admitting: Internal Medicine

## 2014-10-17 ENCOUNTER — Ambulatory Visit: Payer: Medicaid Other | Attending: Internal Medicine | Admitting: Internal Medicine

## 2014-10-17 ENCOUNTER — Encounter: Payer: Self-pay | Admitting: Internal Medicine

## 2014-10-17 VITALS — BP 168/83 | HR 73 | Temp 98.0°F | Resp 16 | Ht 64.0 in | Wt 143.0 lb

## 2014-10-17 DIAGNOSIS — I10 Essential (primary) hypertension: Secondary | ICD-10-CM | POA: Insufficient documentation

## 2014-10-17 DIAGNOSIS — Z0000 Encounter for general adult medical examination without abnormal findings: Secondary | ICD-10-CM

## 2014-10-17 DIAGNOSIS — E785 Hyperlipidemia, unspecified: Secondary | ICD-10-CM | POA: Diagnosis not present

## 2014-10-17 DIAGNOSIS — N760 Acute vaginitis: Secondary | ICD-10-CM | POA: Diagnosis present

## 2014-10-17 DIAGNOSIS — Z87891 Personal history of nicotine dependence: Secondary | ICD-10-CM | POA: Insufficient documentation

## 2014-10-17 DIAGNOSIS — Z7982 Long term (current) use of aspirin: Secondary | ICD-10-CM | POA: Insufficient documentation

## 2014-10-17 DIAGNOSIS — Z113 Encounter for screening for infections with a predominantly sexual mode of transmission: Secondary | ICD-10-CM | POA: Diagnosis present

## 2014-10-17 DIAGNOSIS — Z01419 Encounter for gynecological examination (general) (routine) without abnormal findings: Secondary | ICD-10-CM | POA: Insufficient documentation

## 2014-10-17 DIAGNOSIS — J449 Chronic obstructive pulmonary disease, unspecified: Secondary | ICD-10-CM | POA: Diagnosis not present

## 2014-10-17 DIAGNOSIS — Z79899 Other long term (current) drug therapy: Secondary | ICD-10-CM | POA: Insufficient documentation

## 2014-10-17 DIAGNOSIS — Z23 Encounter for immunization: Secondary | ICD-10-CM | POA: Diagnosis not present

## 2014-10-17 DIAGNOSIS — Z1151 Encounter for screening for human papillomavirus (HPV): Secondary | ICD-10-CM | POA: Insufficient documentation

## 2014-10-17 MED ORDER — ATORVASTATIN CALCIUM 20 MG PO TABS: 20.0000 mg | ORAL_TABLET | Freq: Every day | ORAL | Status: DC

## 2014-10-17 MED ORDER — LOSARTAN POTASSIUM 25 MG PO TABS: 25.0000 mg | ORAL_TABLET | Freq: Every day | ORAL | Status: DC

## 2014-10-17 MED ORDER — TIOTROPIUM BROMIDE MONOHYDRATE 18 MCG IN CAPS: 18.0000 ug | ORAL_CAPSULE | Freq: Every day | RESPIRATORY_TRACT | Status: DC

## 2014-10-17 MED ORDER — ALBUTEROL SULFATE HFA 108 (90 BASE) MCG/ACT IN AERS: 2.0000 | INHALATION_SPRAY | RESPIRATORY_TRACT | Status: DC | PRN

## 2014-10-17 NOTE — Progress Notes (Signed)
Patient states that she is here for a  Physical And also requesting a refill on her her spiriva

## 2014-10-20 ENCOUNTER — Telehealth: Payer: Self-pay

## 2014-10-20 LAB — CYTOLOGY - PAP

## 2014-10-20 LAB — CERVICOVAGINAL ANCILLARY ONLY
Chlamydia: NEGATIVE
Neisseria Gonorrhea: NEGATIVE
Wet Prep (BD Affirm): POSITIVE — AB

## 2014-10-20 MED ORDER — FLUCONAZOLE 150 MG PO TABS: 150.0000 mg | ORAL_TABLET | Freq: Once | ORAL | Status: DC

## 2014-10-20 NOTE — Telephone Encounter (Signed)
-----   Message from Ambrose Finland, NP sent at 10/20/2014  1:14 PM EDT ----- positive for yeast. Please send diflucan 150 mg to take one tablet once. No refills. Do not take cholesterol medication on the day that she takes diflucan. May resume on the next day

## 2014-10-20 NOTE — Telephone Encounter (Signed)
Spoke with patient and she is aware she does have a yeast infection Per patient request [facility] [address]

## 2014-10-21 NOTE — Progress Notes (Signed)
Patient ID: Judith Scott, female   DOB: 05-30-1955, 59 y.o.   MRN: [id number]  CC: annual physical  HPI: Judith Scott is a 59 y.o. female here today for a annual physical  Patient has past medical history of COPD, HTN, HLD, tobacco abuse. Patient reports that she has been feeling well and is compliant with all medications without complications. She is up to date on her mammogram which was completed earlier this year and was normal. She has not had a pap smear in several years and denies any vaginal complaints today. She has no other concerns today. No Known Allergies Past Medical History  Diagnosis Date  . COPD (chronic obstructive pulmonary disease) (HCC)   . Hypercholesteremia   . COPD exacerbation (HCC) 05/25/2012  . Hypertension    Current Outpatient Prescriptions on File Prior to Visit  Medication Sig Dispense Refill  . aspirin 81 MG chewable tablet Chew 81 mg by mouth daily.    . Cholecalciferol (VITAMIN D PO) Take 1 tablet by mouth daily.    . SYMBICORT 160-4.5 MCG/ACT inhaler INHALE 2 PUFFS INTO THE LUNGS 2 (TWO) TIMES DAILY. 10.2 Inhaler 6  . albuterol (PROVENTIL) (5 MG/ML) 0.5% nebulizer solution Take 2.5 mg by nebulization every 6 (six) hours as needed for wheezing or shortness of breath.     No current facility-administered medications on file prior to visit.   Family History  Problem Relation Age of Onset  . 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 Marital Status: Legally Separated    Spouse Name: N/A  . Number of Children: N/A  . Years of Education: N/A   Occupational History  . Not on file.   Social History Main Topics  . Smoking status: Former Smoker -- 0.50 packs/day for 40 years    Types: Cigarettes    Quit date: 06/10/2014  . Smokeless tobacco: Never Used  . Alcohol Use: 0.0 oz/week    0 Standard drinks or equivalent per week     Comment: occasional  . Drug Use: No  . Sexual Activity: Yes    Birth  Control/ Protection: Post-menopausal   Other Topics Concern  . Not on file   Social History Narrative    Review of Systems  Respiratory: Positive for cough, shortness of breath and wheezing.   All other systems reviewed and are negative.  Objective:   Filed Vitals:   10/17/14 1411  BP: 168/83  Pulse: 73  Temp: 98 F (36.7 C)  Resp: 16    Physical Exam: Constitutional: Patient appears well-developed and well-nourished. No distress. HENT: Normocephalic, atraumatic, External right and left ear normal. Oropharynx is clear and moist.  Eyes: Conjunctivae and EOM are normal. PERRLA, no scleral icterus. Neck: Normal ROM. Neck supple. No JVD. No tracheal deviation. No thyromegaly. CVS: RRR, S1/S2 +, no murmurs, no gallops, no carotid bruit.  Pulmonary: Effort and breath sounds normal, no stridor, rhonchi, wheezes, rales.  Abdominal: Soft. BS +,  no distension, tenderness, rebound or guarding.  Musculoskeletal: Normal range of motion. No edema and no tenderness.  Lymphadenopathy: No lymphadenopathy noted, cervical, inguinal or axillary Neuro: Alert. Normal reflexes, muscle tone coordination. No cranial nerve deficit. Skin: Skin is warm and dry. No rash noted. Not diaphoretic. No erythema. No pallor. Psychiatric: Normal mood and affect. Behavior, judgment, thought content normal. Genitalia: Normal female without lesion, discharge or tenderness, NSSA, NT, no adnexal masses felt on exam  Lab Results  Component Value Date   WBC 10.6* 10/15/2013   HGB 12.0 10/15/2013   HCT 36.1 10/15/2013   MCV 92.3 10/15/2013   PLT 273 10/15/2013   Lab Results  Component Value Date   CREATININE 0.62 06/04/2014   BUN 6 06/04/2014   NA 141 06/04/2014   K 4.4 06/04/2014   CL 100 06/04/2014   CO2 32 06/04/2014    No results found for: HGBA1C Lipid Panel     Component Value Date/Time   CHOL 184 02/24/2014 0918   TRIG 117 02/24/2014 0918   HDL 53 02/24/2014 0918   CHOLHDL 3.5 02/24/2014  0918   VLDL 23 02/24/2014 0918   LDLCALC 108* 02/24/2014 0918       Assessment and plan:   Judith Scott was seen today for annual exam.  Diagnoses and all orders for this visit:  Annual physical exam -     Cytology - PAP Pettus -     Cervicovaginal ancillary only -     Cervicovaginal ancillary only  COPD GOLD III  -     albuterol (PROVENTIL HFA;VENTOLIN HFA) 108 (90 BASE) MCG/ACT inhaler; Inhale 2 puffs into the lungs every 4 (four) hours as needed for wheezing or shortness of breath. -     tiotropium (SPIRIVA) 18 MCG inhalation capsule; Place 1 capsule (18 mcg total) into inhaler and inhale daily. Stable, meds refilled   Essential hypertension, benign -     losartan (COZAAR) 25 MG tablet; Take 1 tablet (25 mg total) by mouth daily. BP slightly elevated in office today but has been normal on all other visits, meds refilled. Will check on f/u visit  Hyperlipidemia -     atorvastatin (LIPITOR) 20 MG tablet; Take 1 tablet (20 mg total) by mouth daily. Stable, meds refilled  Need for influenza vaccination -     Flu Vaccine QUAD 36+ mos PF IM (Fluarix & Fluzone Quad PF)   Return in about 3 months (around 01/17/2015) for Hypertension.       Judith Finland, NP-C [facility] [phone] 10/21/2014, 1:13 PM

## 2014-10-24 ENCOUNTER — Telehealth: Payer: Self-pay

## 2014-10-24 NOTE — Telephone Encounter (Signed)
-----   Message from Ambrose Finland, NP sent at 10/22/2014  7:28 PM EDT ----- Normal cytology. Repeat in 3 years

## 2014-10-24 NOTE — Telephone Encounter (Signed)
Spoke with Judith Scott and she is aware her cytology was normal and To repeat again in three years

## 2014-11-30 ENCOUNTER — Other Ambulatory Visit: Payer: Self-pay | Admitting: Internal Medicine

## 2014-12-01 ENCOUNTER — Other Ambulatory Visit: Payer: Self-pay | Admitting: Internal Medicine

## 2014-12-01 NOTE — Telephone Encounter (Signed)
Pt. Called stating her pharmacy needs prior authorization for all her current med. Please f/u with pt.

## 2014-12-03 ENCOUNTER — Telehealth: Payer: Self-pay | Admitting: Internal Medicine

## 2014-12-03 NOTE — Telephone Encounter (Signed)
Patient called stating that she has been having a cough and would like something recommended for it. Please f/u

## 2014-12-04 ENCOUNTER — Other Ambulatory Visit: Payer: Self-pay | Admitting: Internal Medicine

## 2014-12-05 ENCOUNTER — Telehealth: Payer: Self-pay | Admitting: Internal Medicine

## 2014-12-05 ENCOUNTER — Telehealth: Payer: Self-pay

## 2014-12-05 NOTE — Telephone Encounter (Signed)
Patient called requesting a medication refill for atorvastatin (LIPITOR). I did inform patient that file says she has 4 available appointments Please follow up with patient

## 2014-12-05 NOTE — Telephone Encounter (Signed)
Returned patient phone call Patient was inquiring what she could take for her cough, mainly at night Recommended Delsym and she said the pharmacist recommended that as well

## 2014-12-10 ENCOUNTER — Ambulatory Visit: Payer: Medicaid Other | Attending: Internal Medicine | Admitting: Internal Medicine

## 2014-12-10 ENCOUNTER — Encounter: Payer: Self-pay | Admitting: Internal Medicine

## 2014-12-10 VITALS — BP 155/80 | HR 76 | Temp 98.0°F | Resp 16 | Ht 64.0 in | Wt 145.0 lb

## 2014-12-10 DIAGNOSIS — E7800 Pure hypercholesterolemia, unspecified: Secondary | ICD-10-CM | POA: Insufficient documentation

## 2014-12-10 DIAGNOSIS — F17200 Nicotine dependence, unspecified, uncomplicated: Secondary | ICD-10-CM

## 2014-12-10 DIAGNOSIS — R059 Cough, unspecified: Secondary | ICD-10-CM

## 2014-12-10 DIAGNOSIS — J449 Chronic obstructive pulmonary disease, unspecified: Secondary | ICD-10-CM | POA: Diagnosis not present

## 2014-12-10 DIAGNOSIS — Z7982 Long term (current) use of aspirin: Secondary | ICD-10-CM | POA: Diagnosis not present

## 2014-12-10 DIAGNOSIS — E785 Hyperlipidemia, unspecified: Secondary | ICD-10-CM

## 2014-12-10 DIAGNOSIS — Z23 Encounter for immunization: Secondary | ICD-10-CM | POA: Diagnosis not present

## 2014-12-10 DIAGNOSIS — Z79899 Other long term (current) drug therapy: Secondary | ICD-10-CM | POA: Diagnosis not present

## 2014-12-10 DIAGNOSIS — R05 Cough: Secondary | ICD-10-CM

## 2014-12-10 DIAGNOSIS — I10 Essential (primary) hypertension: Secondary | ICD-10-CM

## 2014-12-10 LAB — BASIC METABOLIC PANEL
BUN: 5 mg/dL — ABNORMAL LOW (ref 7–25)
CALCIUM: 9.1 mg/dL (ref 8.6–10.4)
CHLORIDE: 93 mmol/L — AB (ref 98–110)
CO2: 29 mmol/L (ref 20–31)
Creat: 0.46 mg/dL — ABNORMAL LOW (ref 0.50–0.99)
Glucose, Bld: 78 mg/dL (ref 65–99)
Potassium: 4.8 mmol/L (ref 3.5–5.3)
SODIUM: 134 mmol/L — AB (ref 135–146)

## 2014-12-10 LAB — LIPID PANEL
Cholesterol: 165 mg/dL (ref 125–200)
HDL: 37 mg/dL — AB (ref >=46)
LDL CALC: 103 mg/dL (ref <130)
Total CHOL/HDL Ratio: 4.5 Ratio (ref <=5.0)
Triglycerides: 123 mg/dL (ref <150)
VLDL: 25 mg/dL (ref <30)

## 2014-12-10 MED ORDER — LOSARTAN POTASSIUM 50 MG PO TABS: 50.0000 mg | ORAL_TABLET | Freq: Every day | ORAL | Status: DC

## 2014-12-10 NOTE — Progress Notes (Signed)
Patient complains of having a cough for the past week Has been using mucinex DM Current on 3 liters oxygen at home

## 2014-12-10 NOTE — Patient Instructions (Signed)
Please call if you feel fever, chills, night sweats, more cough, SOB

## 2014-12-11 NOTE — Progress Notes (Signed)
Patient ID: Judith Scott, female   DOB: 05-03-1955, 59 y.o.   MRN: [id number]  CC: cough  HPI: Judith Scott is a 59 y.o. female here today for a follow up visit.  Patient has past medical history of COPD, HLD, tobacco use, and HTN. Patient presents with symptoms of a cough for the past one week. She was exposed to her sick grandchild who was diagnosed with a viral URI. She states that today she feels much better and the cough has improved dramatically. She denies symptoms of chest pain, fever, chills, nausea, or vomiting. She does continue to wear her oxygen at 3 L when needed. Today her oxygen is 90% on room air but states she left her tank in the car. She still smokes 0.5 ppd. Patient reports that she takes her blood pressure medication daily and does not skip doses. She has had 2 other elevated pressures before today on her current medication dose. She does not check her pressure outside of here.   No Known Allergies Past Medical History  Diagnosis Date  . COPD (chronic obstructive pulmonary disease) (HCC)   . Hypercholesteremia   . COPD exacerbation (HCC) 05/25/2012  . Hypertension    Current Outpatient Prescriptions on File Prior to Visit  Medication Sig Dispense Refill  . albuterol (PROVENTIL HFA;VENTOLIN HFA) 108 (90 BASE) MCG/ACT inhaler Inhale 2 puffs into the lungs every 4 (four) hours as needed for wheezing or shortness of breath. 1 Inhaler 3  . albuterol (PROVENTIL) (5 MG/ML) 0.5% nebulizer solution Take 2.5 mg by nebulization every 6 (six) hours as needed for wheezing or shortness of breath.    Marland Kitchen aspirin 81 MG chewable tablet Chew 81 mg by mouth daily.    Marland Kitchen atorvastatin (LIPITOR) 20 MG tablet Take 1 tablet (20 mg total) by mouth daily. 30 tablet 4  . Cholecalciferol (VITAMIN D PO) Take 1 tablet by mouth daily.    . fluconazole (DIFLUCAN) 150 MG tablet Take 1 tablet (150 mg total) by mouth once. 1 tablet 0  . SYMBICORT 160-4.5 MCG/ACT inhaler INHALE 2 PUFFS INTO THE LUNGS 2 (TWO)  TIMES DAILY. 10.2 Inhaler 6  . tiotropium (SPIRIVA) 18 MCG inhalation capsule Place 1 capsule (18 mcg total) into inhaler and inhale daily. 30 capsule 5   No current facility-administered medications on file prior to visit.   Family History  Problem Relation Age of Onset  . 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 Marital Status: Legally Separated    Spouse Name: N/A  . Number of Children: N/A  . Years of Education: N/A   Occupational History  . Not on file.   Social History Main Topics  . Smoking status: Former Smoker -- 0.50 packs/day for 40 years    Types: Cigarettes    Quit date: 06/10/2014  . Smokeless tobacco: Never Used  . Alcohol Use: 0.0 oz/week    0 Standard drinks or equivalent per week     Comment: occasional  . Drug Use: No  . Sexual Activity: Yes    Birth Control/ Protection: Post-menopausal   Other Topics Concern  . Not on file   Social History Narrative    Review of Systems: Other than what is stated in HPI, all other systems are negative.   Objective:   Filed Vitals:   12/10/14 0931  BP: 155/80  Pulse: 76  Temp: 98 F (36.7 C)  Resp: 16  Physical Exam  Constitutional: She is oriented to person, place, and time.  HENT:  Right Ear: External ear normal.  Left Ear: External ear normal.  Mouth/Throat: Oropharynx is clear and moist.  Cardiovascular: Normal rate, regular rhythm and normal heart sounds.   Pulmonary/Chest: Effort normal and breath sounds normal. She has no wheezes.  Musculoskeletal: She exhibits no edema.  Lymphadenopathy:    She has no cervical adenopathy.  Neurological: She is alert and oriented to person, place, and time.  Skin: Skin is warm and dry.  Psychiatric: She has a normal mood and affect.     Lab Results  Component Value Date   WBC 10.6* 10/15/2013   HGB 12.0 10/15/2013   HCT 36.1 10/15/2013   MCV 92.3 10/15/2013   PLT 273 10/15/2013   Lab  Results  Component Value Date   CREATININE 0.46* 12/10/2014   BUN 5* 12/10/2014   NA 134* 12/10/2014   K 4.8 12/10/2014   CL 93* 12/10/2014   CO2 29 12/10/2014    No results found for: HGBA1C Lipid Panel     Component Value Date/Time   CHOL 165 12/10/2014 1016   TRIG 123 12/10/2014 1016   HDL 37* 12/10/2014 1016   CHOLHDL 4.5 12/10/2014 1016   VLDL 25 12/10/2014 1016   LDLCALC 103 12/10/2014 1016       Assessment and plan:   [name] was seen today for cough.  Diagnoses and all orders for this visit:  Essential hypertension, benign -     losartan (COZAAR) 50 MG tablet; Take 1 tablet (50 mg total) by mouth daily. -     Basic Metabolic Panel I have increased her losartan to 50 mg daily. I will have her to come back in 2 weeks for a recheck.  Cough Resolving. Return precautions advised. If symptoms persist past 7 days she will call me and I will send her for a chest xray and send Rx for antibiotics since she is high risk and has COPD. Smoking cessation advised.  HLD (hyperlipidemia) -     Lipid panel Will recheck levels today. Last LDL was not at goal 6 months ago.   Need for Tdap vaccination -     Tdap vaccine greater than or equal to 7yo IM  Tobacco use disorder Smoking cessation discussed for 3 minutes, patient is not willing to quit at this time. Will continue to assess on each visit. Discussed increased risk for diseases such as cancer, heart disease, and stroke.   Return in about 3 months (around 03/10/2015) for Hypertension.       Ambrose Finland, NP-C [facility] [phone] 12/11/2014, 9:08 AM

## 2014-12-15 ENCOUNTER — Telehealth: Payer: Self-pay

## 2014-12-15 DIAGNOSIS — E785 Hyperlipidemia, unspecified: Secondary | ICD-10-CM

## 2014-12-15 MED ORDER — ATORVASTATIN CALCIUM 40 MG PO TABS: 40.0000 mg | ORAL_TABLET | Freq: Every day | ORAL | Status: DC

## 2014-12-15 NOTE — Telephone Encounter (Signed)
Attempted to contact patient Patient not available Left message on voice mail to return our call 

## 2014-12-15 NOTE — Telephone Encounter (Signed)
-----   Message from Ambrose Finland, NP sent at 12/14/2014  8:27 PM EST ----- Blood levels are off, because of her respiratory status recently. I will recheck on next visit. Cholesterol is still elevated, please increase her atorvastatin to 40 mg. If she begins to develop muscle aches then have her to call us asap.

## 2014-12-15 NOTE — Telephone Encounter (Signed)
Patient returned phone call and is aware of her lab results And to pick up her [facility] for [facility] at [facility]

## 2014-12-17 ENCOUNTER — Telehealth: Payer: Self-pay | Admitting: Internal Medicine

## 2014-12-17 ENCOUNTER — Other Ambulatory Visit: Payer: Self-pay | Admitting: Internal Medicine

## 2014-12-17 ENCOUNTER — Ambulatory Visit (HOSPITAL_COMMUNITY)
Admission: RE | Admit: 2014-12-17 | Discharge: 2014-12-17 | Disposition: A | Discharge status: Home / Self Care | Payer: Medicaid Other | Source: Ambulatory Visit | Attending: Internal Medicine | Admitting: Internal Medicine

## 2014-12-17 DIAGNOSIS — R0602 Shortness of breath: Secondary | ICD-10-CM

## 2014-12-17 DIAGNOSIS — R05 Cough: Secondary | ICD-10-CM | POA: Diagnosis not present

## 2014-12-17 NOTE — Telephone Encounter (Signed)
Patient states the she is still having shortness of breath, chills and night sweats Please follow up with patient

## 2014-12-17 NOTE — Telephone Encounter (Signed)
Patient called stated that she went to pulmonary Please follow up with results

## 2014-12-19 NOTE — Telephone Encounter (Signed)
Patient verified DOB Patient advised of chest x-ray being normal. Patient states she has an appointment with pulmonologist in January. Patient states she feels "fine" today.  Patient stated last night was the first night she was able to rest and not have to get up. Patient had no further questions and expressed her understanding.

## 2014-12-19 NOTE — Telephone Encounter (Signed)
-----   Message from Ambrose Finland, NP sent at 12/17/2014  2:30 PM EST ----- Normal chest xray. Please make sure she has scheduled appointment with Pulmonology!!! Is she having any fever or chills

## 2014-12-22 NOTE — Telephone Encounter (Signed)
Pt. Called stating that her boyfriend was diagnosed  with bronchitis and pt. Stated if she has to be in antibiotics for it two. Please. F/u with pt.

## 2014-12-23 ENCOUNTER — Telehealth: Payer: Self-pay

## 2014-12-23 NOTE — Telephone Encounter (Signed)
Is she having fever, chills? Has her cough improved?

## 2014-12-23 NOTE — Telephone Encounter (Signed)
Returned call to patient Patient not available Left message on voice mail to return our call 

## 2014-12-31 ENCOUNTER — Other Ambulatory Visit: Payer: Self-pay | Admitting: Adult Health

## 2015-01-01 ENCOUNTER — Telehealth: Payer: Self-pay | Admitting: Internal Medicine

## 2015-01-01 MED ORDER — ALBUTEROL SULFATE (5 MG/ML) 0.5% IN NEBU: 2.5000 mg | INHALATION_SOLUTION | Freq: Four times a day (QID) | RESPIRATORY_TRACT | Status: DC | PRN

## 2015-01-01 NOTE — Telephone Encounter (Signed)
Patient called and requested a med refill for:  albuterol (PROVENTIL) (5 MG/ML) 0.5% nebulizer solution  Please f/u with pt.

## 2015-01-20 ENCOUNTER — Telehealth: Payer: Self-pay

## 2015-01-20 NOTE — Telephone Encounter (Signed)
Pt. Called stating that her medication of losartan (COZAAR) 50 MG tablet, can not be refilled until the PCP contacts the pt. Medicaid. Please f/u with pt.

## 2015-01-20 NOTE — Telephone Encounter (Signed)
Returned patient phone call Patient is stating the medication Losartan needs prior authorization [facility] [address] and he has verified this thanks

## 2015-01-23 ENCOUNTER — Telehealth: Payer: Self-pay | Admitting: Internal Medicine

## 2015-01-23 NOTE — Telephone Encounter (Signed)
Patient called wanting to know the status of the Prior Authorization for medicaid Please follow up with patient.

## 2015-01-26 ENCOUNTER — Telehealth: Payer: Self-pay

## 2015-01-26 NOTE — Telephone Encounter (Signed)
Returned patient phone call Patient is still waiting on her prior authorization for her  Medication Call transferred to [facility] RN who is working on getting it  authorized

## 2015-01-26 NOTE — Telephone Encounter (Signed)
-----   Message from Judith Mount, LPN sent at [phone] 12:05 PM EST -----  Expand All Collapse All    Patient called wanting to know the status of the Prior Authorization for medicaid Please follow up with patient.

## 2015-01-26 NOTE — Telephone Encounter (Signed)
CMA called patient, patient verified name and DOB. Patient is waiting for prior authorization for Losartan. I informed patient that I would send medicaid a prior auth tomorrow when I'm at [facility] on, 01/26/14. Patient stated that she only have enough medication for this week. She said out of pocket she's paying about $23 for not even a 30 day supply. Will send a message to Provider to see if she wants to change rx, or proceed with prior authorizatoion.

## 2015-01-27 NOTE — Telephone Encounter (Signed)
[facility]artan went through. [facility], [facility] did go through. Medication is $3 copay. Nurse called patient, patient verified date of birth. Patient aware of losartan being approved. Patient voices understanding and has no further questions at this time.

## 2015-03-19 ENCOUNTER — Ambulatory Visit (INDEPENDENT_AMBULATORY_CARE_PROVIDER_SITE_OTHER): Payer: Medicaid Other | Admitting: Internal Medicine

## 2015-03-19 ENCOUNTER — Encounter: Payer: Self-pay | Admitting: Internal Medicine

## 2015-03-19 VITALS — BP 152/88 | HR 80 | Ht 64.0 in | Wt 144.0 lb

## 2015-03-19 DIAGNOSIS — R911 Solitary pulmonary nodule: Secondary | ICD-10-CM | POA: Diagnosis not present

## 2015-03-19 DIAGNOSIS — J449 Chronic obstructive pulmonary disease, unspecified: Secondary | ICD-10-CM | POA: Diagnosis not present

## 2015-03-19 DIAGNOSIS — Z720 Tobacco use: Secondary | ICD-10-CM | POA: Diagnosis not present

## 2015-03-19 DIAGNOSIS — J9611 Chronic respiratory failure with hypoxia: Secondary | ICD-10-CM | POA: Diagnosis not present

## 2015-03-19 DIAGNOSIS — F17210 Nicotine dependence, cigarettes, uncomplicated: Secondary | ICD-10-CM

## 2015-03-19 MED ORDER — TIOTROPIUM BROMIDE MONOHYDRATE 2.5 MCG/ACT IN AERS: INHALATION_SPRAY | RESPIRATORY_TRACT | Status: DC

## 2015-03-19 NOTE — Progress Notes (Signed)
Subjective:    Patient ID: Judith Scott, female    DOB: 1955-05-16  MRN: [id number]    Brief patient profile:  44 yowf  Active smoker dx with copd around 2010 using albuterol neb around twice daily but good activity tolerance then 2014 started on spriva then  x 2 weeks gradually worse > admit  Admit date: 10/13/2013  Discharge date: 10/15/2013    Discharge Diagnoses:  Acute respiratory failure with hypoxia secondary to COPD exacerbation  COPD exacerbation secondary to continued tobacco abuse which is by radiology  Tobacco abuse  Nodular opacity found on x-ray  Hyponatremia and hypochloremia  Hypertension  Hyperlipidemia       10/23/2013 1st [facility] office visit/ Judith Scott / post hosp f/u 02 3lpm and no longer smoking  Chief Complaint  Patient presents with  . Pulmonary Consult    Referred by Dr. Luna Glasgow. Pt states dxed with COPD approx 5 yrs ago. She was had recent exacerbation and was admitted to hospital from 10/13/13 to 10/15/13. She states that her breathing is back to her normal baseline. She has not had to use her rescue inhaler and uses her albuterol neb 4 x per day.   back to baseline activity tol but 02 dep  Cough is clear/ slt yellow worse in ams Confused with how/ when to use saba with poor hfa and dpi so neb dependent  >rx symbicort and spiriva    11/06/2013 Follow up COPD/Chronic O2 depend RF /Lung nodule  Returns for follow up .   Advised per MW to stop Lisinopril, PCP told patient to remain on it but at lower dose - 5mg . Using O2 at night and PRN daytime.  Seen last ov for post hospital follow up .  Started on Symbicort . Remains on Spiriva  Needs PFTs Feels her breathing is better since discharge.  No flare in cough or wheezing  Gets winded with walking incline or long distance.  rec Continue to wear Oxygen 3 l/m with long distance walking and At bedtime  .  Return in 6 weeks with Dr.  And PFT .  Continue on working to quit smoking -most important  goal .  Discuss with family doctor regarding change your Lisinopril to alternative to due to cough.  Continue on Symbicort and Spiriva .  Rinse after all inhaler use.      12/18/2013 f/u ov/Judith Scott re: GOLD III / still smoking  Chief Complaint  Patient presents with  . Follow-up    PFT done today. She is using proair 3-4 x per wk, but has not needed neb. No new co's today.   once improved after aecopd resumed smoking. "just my usual bronchitis cough" worse in ams min white to beige mucus, clears up p first 30 min  Doe x > 100 yards, or if walks fast or does any inclines  rec The key is to stop smoking completely before smoking completely stops you!  Work on inhaler technique:       03/18/2014 f/u ov/Judith Scott re: copd GOLD III still smoking on symb/spiriva no neb saba any longer  Chief Complaint  Patient presents with  . Follow-up    CXR done today. Pt states that her breathing is overall doing well. She is using rescue inhaler 1 x per wk on average.    [place], cutting back on smoking but living in a trailer with Husband Smoking  rec The key is to stop smoking completely before smoking completely stops  you!  We will see you in June after your scan   06/24/2014 f/u ov/Judith Scott re: spns/ quit smoking x 2 weeks / spiriva/ symbicort / GOLD III  Copd  Chief Complaint  Patient presents with  . Follow-up    Pt states her breathing is unchanged. She c/o increased wheezing and cough for the past few days. Cough is prod with min clear sputum. She is using rescue inhaler 2-3 x per day and has not needed to use neb.   not needing neb/ walking flat nl pace no problem   rec No change rx    03/19/2015  f/u ov/Judith Scott re: GOLD III copd/ still smoking 3lpm hs and walkng   Chief Complaint  Patient presents with  . Follow-up    Pt c/o continued cough with yellow mucus and SOB. Pt denies wheeze/CP/tightness. Pt uses 3L O2 when ambulating.   doe on 42 = MMRC1 = can walk nl pace, flat  grade, can't hurry or go uphills or steps s sob   Cough worse in am since restarted smoking / rare need for saba    No obvious day to day or daytime variabilty or assoc   cp or chest tightness,  overt sinus or hb symptoms. No unusual exp hx or h/o childhood pna/ asthma or knowledge of premature birth.  Sleeping ok without nocturnal  or early am exacerbation  of respiratory  c/o's or need for noct saba. Also denies any obvious fluctuation of symptoms with weather or environmental changes or other aggravating or alleviating factors except as outlined above   Current Medications, Allergies, Complete Past Medical History, Past Surgical History, Family History, and Social History were reviewed in [employer] record.  ROS  The following are not active complaints unless bolded sore throat, dysphagia, dental problems, itching, sneezing,  nasal congestion or excess/ purulent secretions, ear ache,   fever, chills, sweats, unintended wt loss, pleuritic or exertional cp, hemoptysis,  orthopnea pnd or leg swelling, presyncope, palpitations, heartburn, abdominal pain, anorexia, nausea, vomiting, diarrhea  or change in bowel or urinary habits, change in stools or urine, dysuria,hematuria,  rash, arthralgias, visual complaints, headache, numbness weakness or ataxia or problems with walking or coordination,  change in mood/affect or memory.            Objective:   Physical Exam  amb wf rattling congested cough   06/24/2014       142  > 03/19/2015  144     03/18/14 137 lb (62.143 kg)  02/05/14 135 lb (61.236 kg)  12/18/13 137 lb (62.143 kg)    Vital signs reviewed   HEENT mild turbinate edema.  Oropharynx no thrush or excess pnd or cobblestoning.  No JVD or cervical adenopathy. Mild accessory muscle hypertrophy. Trachea midline, nl thryroid. Chest was hyperinflated by percussion with diminished breath sounds and moderate increased exp time without wheeze. Hoover sign positive at mid  inspiration. Regular rate and rhythm without murmur gallop or rub or increase P2 or edema.  Abd: no hsm, nl excursion. Ext warm without cyanosis or clubbing.          I personally reviewed images and agree with radiology impression as follows:  CXR:  1214/16 Mild hyperinflation. No active disease.     Assessment & Plan:

## 2015-03-19 NOTE — Assessment & Plan Note (Signed)
-   d/c on 3lpm 10/15/13 - 10/23/2013   Walked RA x one lap @ 185 stopped due to  desat to 87%  With sob at a nl pace at end of walk  As of 03/19/2015 rec 02 3lpm at hs and walking outside her house but not needed at rest

## 2015-03-19 NOTE — Assessment & Plan Note (Signed)
10/23/2013   try symbicort 160 2bid/spiriva and prn saba 12/17/13 PFTs  FEV1  0.79 (30%) ratio 50 p no sign change from saba and DLOC 38%  - quit smoking 06/04/14 - 06/24/2014 p extensive coaching HFA effectiveness =    90% from a baseline 75%   DDX of  difficult airways management almost all start with A and  include Adherence, Ace Inhibitors, Acid Reflux, Active Sinus Disease, Alpha 1 Antitripsin deficiency, Anxiety masquerading as Airways dz,  ABPA,  Allergy(esp in young), Aspiration (esp in elderly), Adverse effects of meds,  Active smokers, A bunch of PE's (a small clot burden can't cause this syndrome unless there is already severe underlying pulm or vascular dz with poor reserve) plus two Bs  = Bronchiectasis and Beta blocker use..and one C= CHF  Adherence is always the initial "prime suspect" and is a multilayered concern that requires a "trust but verify" approach in every patient - starting with knowing how to use medications, especially inhalers, correctly, keeping up with refills and understanding the fundamental difference between maintenance and prns vs those medications only taken for a very short course and then stopped and not refilled.  - would like to change all rx to inhalers/ not dpi > change to respimat at next ov    Active smoking (see separate a/p)   I had an extended discussion with the patient reviewing all relevant studies completed to date and  lasting 15 to 20 minutes of a 25 minute visit    Each maintenance medication was reviewed in detail including most importantly the difference between maintenance and prns and under what circumstances the prns are to be triggered using an action plan format that is not reflected in the computer generated alphabetically organized AVS.    Please see instructions for details which were reviewed in writing and the patient given a copy highlighting the part that I personally wrote and discussed at today's ov.

## 2015-03-19 NOTE — Assessment & Plan Note (Signed)
-  first  seen on cxr RLL 10/14/13 > tickle file for CT 11/23/13  >CT chest 12/16/13 --Multiple bilateral pulmonary nodules measuring up to 8 mm in the right lower lobe, as seen on recent chest x-ray. >repeat CT 06/18/2014 >>No suspicious lung nodule is seen.  Benign-appearing nodules appear completely stable . No mediastinal or hilar adenopathy is seen. >repeat CT chest ~06/2015  Tickle file for 06/18/15

## 2015-03-19 NOTE — Assessment & Plan Note (Addendum)
>   3 m matter of life or breath  Discussed the risks and costs (both direct and indirect)  of smoking relative to the benefits of quitting but patient unwilling to commit at this point to a specific quit date but definitely considering/ reinforced.

## 2015-03-19 NOTE — Patient Instructions (Addendum)
No change in your medications or 02 at this point   The key is to stop smoking completely before smoking completely stops you!   Please schedule a follow up visit in 3 months but call sooner if needed  Add consider spiriva respimat

## 2015-03-21 ENCOUNTER — Other Ambulatory Visit: Payer: Self-pay | Admitting: Internal Medicine

## 2015-03-23 ENCOUNTER — Ambulatory Visit: Payer: Medicaid Other | Admitting: Internal Medicine

## 2015-04-02 ENCOUNTER — Encounter: Payer: Self-pay | Admitting: Internal Medicine

## 2015-04-02 ENCOUNTER — Ambulatory Visit: Payer: Medicaid Other | Attending: Internal Medicine | Admitting: Internal Medicine

## 2015-04-02 ENCOUNTER — Telehealth: Payer: Self-pay | Admitting: Internal Medicine

## 2015-04-02 VITALS — BP 158/59 | HR 86 | Temp 98.0°F | Resp 16 | Ht 64.0 in | Wt 144.8 lb

## 2015-04-02 DIAGNOSIS — Z79899 Other long term (current) drug therapy: Secondary | ICD-10-CM | POA: Diagnosis not present

## 2015-04-02 DIAGNOSIS — J441 Chronic obstructive pulmonary disease with (acute) exacerbation: Secondary | ICD-10-CM | POA: Insufficient documentation

## 2015-04-02 DIAGNOSIS — F17200 Nicotine dependence, unspecified, uncomplicated: Secondary | ICD-10-CM | POA: Diagnosis not present

## 2015-04-02 DIAGNOSIS — Z801 Family history of malignant neoplasm of trachea, bronchus and lung: Secondary | ICD-10-CM | POA: Diagnosis not present

## 2015-04-02 DIAGNOSIS — Z87891 Personal history of nicotine dependence: Secondary | ICD-10-CM | POA: Diagnosis not present

## 2015-04-02 DIAGNOSIS — I10 Essential (primary) hypertension: Secondary | ICD-10-CM | POA: Diagnosis not present

## 2015-04-02 DIAGNOSIS — Z7982 Long term (current) use of aspirin: Secondary | ICD-10-CM | POA: Diagnosis not present

## 2015-04-02 DIAGNOSIS — Z7951 Long term (current) use of inhaled steroids: Secondary | ICD-10-CM | POA: Diagnosis not present

## 2015-04-02 DIAGNOSIS — Z716 Tobacco abuse counseling: Secondary | ICD-10-CM | POA: Insufficient documentation

## 2015-04-02 DIAGNOSIS — F17210 Nicotine dependence, cigarettes, uncomplicated: Secondary | ICD-10-CM | POA: Insufficient documentation

## 2015-04-02 DIAGNOSIS — Z720 Tobacco use: Secondary | ICD-10-CM | POA: Insufficient documentation

## 2015-04-02 DIAGNOSIS — E7800 Pure hypercholesterolemia, unspecified: Secondary | ICD-10-CM | POA: Diagnosis not present

## 2015-04-02 MED ORDER — PREDNISONE 20 MG PO TABS: 20.0000 mg | ORAL_TABLET | Freq: Every day | ORAL | Status: DC

## 2015-04-02 MED ORDER — AZITHROMYCIN 250 MG PO TABS: ORAL_TABLET | ORAL | Status: DC

## 2015-04-02 NOTE — Progress Notes (Signed)
Patient reports having trouble breathing.  Patient states she was around her Aunt who also has COPD. Pt Aunt was on an Abx, for an infxn. Patient think she may have caught something from her Aunt.   Patient reports taken her meds this morning.

## 2015-04-02 NOTE — Telephone Encounter (Signed)
error 

## 2015-04-02 NOTE — Progress Notes (Signed)
Patient ID: Judith Scott, female   DOB: January 05, 1955, 60 y.o.   MRN: [id number]  CC: COPD exacerbation  HPI: Judith Scott is a 60 y.o. female here today for a follow up visit.  Patient has past medical history of COPD, hypercholesteremia, and HTN. Patient reports that she has been having increased difficulties with catching her breath since the weekend. Patient is concerned because she was recently around her Aunt who was sick with a bacterial URI and how she is beginning to have similar symptoms of SOB and cough. Patient states that she felt like she required more oxygen to sleep last night. Patient admits to continued smoking but not while wearing oxygen.   No Known Allergies Past Medical History  Diagnosis Date  . COPD (chronic obstructive pulmonary disease) (HCC)   . Hypercholesteremia   . COPD exacerbation (HCC) 05/25/2012  . Hypertension    Current Outpatient Prescriptions on File Prior to Visit  Medication Sig Dispense Refill  . albuterol (PROVENTIL HFA;VENTOLIN HFA) 108 (90 BASE) MCG/ACT inhaler Inhale 2 puffs into the lungs every 4 (four) hours as needed for wheezing or shortness of breath. 1 Inhaler 3  . albuterol (PROVENTIL) (5 MG/ML) 0.5% nebulizer solution Take 0.5 mLs (2.5 mg total) by nebulization every 6 (six) hours as needed for wheezing or shortness of breath. 20 mL 2  . aspirin 81 MG chewable tablet Chew 81 mg by mouth daily.    Marland Kitchen atorvastatin (LIPITOR) 40 MG tablet TAKE 1 TABLET (40 MG TOTAL) BY MOUTH DAILY. 30 tablet 2  . Cholecalciferol (VITAMIN D PO) Take 1 tablet by mouth daily.    Marland Kitchen losartan (COZAAR) 50 MG tablet Take 1 tablet (50 mg total) by mouth daily. 30 tablet 5  . SYMBICORT 160-4.5 MCG/ACT inhaler INHALE 2 PUFFS INTO THE LUNGS 2 TIMES A DAY 10.2 Inhaler 6  . tiotropium (SPIRIVA) 18 MCG inhalation capsule Place 1 capsule (18 mcg total) into inhaler and inhale daily. 30 capsule 5  . Tiotropium Bromide Monohydrate (SPIRIVA RESPIMAT) 2.5 MCG/ACT AERS 2 puffs each am 1  Inhaler 11  . Dextromethorphan Polistirex (DELSYM PO) Take by mouth. Reported on 04/02/2015    . dextromethorphan-guaiFENesin (MUCINEX DM) 30-600 MG 12hr tablet Take 1 tablet by mouth 2 (two) times daily. Reported on 04/02/2015    . ipratropium-albuterol (DUONEB) 0.5-2.5 (3) MG/3ML SOLN USE 1 VIAL IN NEBULIZER EVERY 4 HOURS AS NEEDED (Patient not taking: Reported on 04/02/2015) 360 mL 0   No current facility-administered medications on file prior to visit.   Family History  Problem Relation Age of Onset  . 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 Marital Status: Legally Separated    Spouse Name: N/A  . Number of Children: N/A  . Years of Education: N/A   Occupational History  . Not on file.   Social History Main Topics  . Smoking status: Former Smoker -- 0.50 packs/day for 40 years    Types: Cigarettes    Quit date: 06/10/2014  . Smokeless tobacco: Never Used  . Alcohol Use: 0.0 oz/week    0 Standard drinks or equivalent per week     Comment: occasional  . Drug Use: No  . Sexual Activity: Yes    Birth Control/ Protection: Post-menopausal   Other Topics Concern  . Not on file   Social History Narrative    Review of Systems: Other than what is stated in HPI, all other  systems are negative.   Objective:   Filed Vitals:   04/02/15 1141  BP: 158/59  Pulse: 86  Temp: 98 F (36.7 C)  Resp: 16    Physical Exam  Constitutional: She is oriented to person, place, and time.  HENT:  Right Ear: External ear normal.  Left Ear: External ear normal.  Mouth/Throat: Oropharynx is clear and moist.  Cardiovascular: Normal rate, regular rhythm and normal heart sounds.   Pulmonary/Chest: Effort normal and breath sounds normal.  3 lpm oxygen   Neurological: She is alert and oriented to person, place, and time.  Skin: Skin is warm and dry.  Psychiatric: She has a normal mood and affect.     Lab Results  Component Value  Date   WBC 10.6* 10/15/2013   HGB 12.0 10/15/2013   HCT 36.1 10/15/2013   MCV 92.3 10/15/2013   PLT 273 10/15/2013   Lab Results  Component Value Date   CREATININE 0.46* 12/10/2014   BUN 5* 12/10/2014   NA 134* 12/10/2014   K 4.8 12/10/2014   CL 93* 12/10/2014   CO2 29 12/10/2014    No results found for: HGBA1C Lipid Panel     Component Value Date/Time   CHOL 165 12/10/2014 1016   TRIG 123 12/10/2014 1016   HDL 37* 12/10/2014 1016   CHOLHDL 4.5 12/10/2014 1016   VLDL 25 12/10/2014 1016   LDLCALC 103 12/10/2014 1016       Assessment and plan:   Judith Scott was seen today for breathing problem.  Diagnoses and all orders for this visit:  COPD exacerbation (HCC) -     azithromycin (ZITHROMAX) 250 MG tablet; Take 2 pills today, then 1 pill each day after until complete -     predniSONE (DELTASONE) 20 MG tablet; Take 1 tablet (20 mg total) by mouth daily with breakfast. Patient's lungs actually sound very clear. Noticed that her oxygen sat is still low on 3 LPM. I will give patient written z-pack and oral steroids prescription to keep in case she begins to have a bad exacerbation.   Tobacco use disorder Smoking cessation discussed for 3 minutes, patient is not willing to quit at this time. Will continue to assess on each visit. Discussed increased risk for diseases such as cancer, heart disease, and stroke.    Return if symptoms worsen or fail to improve.      Ambrose Finland, NP-C [facility] [phone] 04/02/2015, 11:55 AM

## 2015-04-20 ENCOUNTER — Other Ambulatory Visit: Payer: Self-pay | Admitting: Internal Medicine

## 2015-05-15 ENCOUNTER — Other Ambulatory Visit: Payer: Self-pay | Admitting: Internal Medicine

## 2015-05-15 DIAGNOSIS — R911 Solitary pulmonary nodule: Secondary | ICD-10-CM

## 2015-05-22 ENCOUNTER — Other Ambulatory Visit: Payer: Self-pay

## 2015-05-22 DIAGNOSIS — N644 Mastodynia: Secondary | ICD-10-CM

## 2015-06-17 ENCOUNTER — Other Ambulatory Visit: Payer: Self-pay | Admitting: Internal Medicine

## 2015-06-18 ENCOUNTER — Ambulatory Visit (INDEPENDENT_AMBULATORY_CARE_PROVIDER_SITE_OTHER)
Admission: RE | Admit: 2015-06-18 | Discharge: 2015-06-18 | Disposition: A | Discharge status: Home / Self Care | Payer: Medicaid Other | Source: Ambulatory Visit | Attending: Internal Medicine | Admitting: Internal Medicine

## 2015-06-18 DIAGNOSIS — R911 Solitary pulmonary nodule: Secondary | ICD-10-CM | POA: Diagnosis not present

## 2015-06-19 NOTE — Progress Notes (Signed)
Quick Note:  Spoke with pt and notified of results per Dr. Wert. Pt verbalized understanding and denied any questions.  ______ 

## 2015-06-29 ENCOUNTER — Ambulatory Visit (INDEPENDENT_AMBULATORY_CARE_PROVIDER_SITE_OTHER): Payer: Medicaid Other | Admitting: Internal Medicine

## 2015-06-29 ENCOUNTER — Encounter: Payer: Self-pay | Admitting: Internal Medicine

## 2015-06-29 VITALS — BP 150/80 | HR 74 | Ht 64.0 in | Wt 145.0 lb

## 2015-06-29 DIAGNOSIS — Z720 Tobacco use: Secondary | ICD-10-CM

## 2015-06-29 DIAGNOSIS — R911 Solitary pulmonary nodule: Secondary | ICD-10-CM | POA: Diagnosis not present

## 2015-06-29 DIAGNOSIS — F17210 Nicotine dependence, cigarettes, uncomplicated: Secondary | ICD-10-CM

## 2015-06-29 DIAGNOSIS — J449 Chronic obstructive pulmonary disease, unspecified: Secondary | ICD-10-CM | POA: Diagnosis not present

## 2015-06-29 NOTE — Assessment & Plan Note (Signed)
10/23/2013   try symbicort 160 2bid/spiriva and prn saba 12/17/13 PFTs  FEV1  0.79 (30%) ratio 50 p no sign change from saba and DLOC 38%   - 06/24/2014 p extensive coaching HFA effectiveness =    90% from a baseline 75%  - changed to prn f/u 06/29/2015   I had an extended final summary discussion with the patient reviewing all relevant studies completed to date and  lasting 15 to 20 minutes of a 25 minute visit on the following issues:    1) longterm prognosis is all tied in to permanent smoking cessation (see separate a/p)   2) treatment is directed at symptom control and risk of flare of copd and is presently on max rx with LABA/LAMA/ICS with prn saba so nothing else to offer and no need for regular pulmonary f/u at this point  3)  Each maintenance medication was reviewed in detail including most importantly the difference between maintenance and as needed and under what circumstances the prns are to be used.  Please see instructions for details which were reviewed in writing and the patient given a copy.

## 2015-06-29 NOTE — Patient Instructions (Addendum)
Suggested e-cigs as an optional  "one way bridge"  Off all tobacco products   No further CT scans needed at this point    If you are satisfied with your treatment plan,  let your doctor know and he/she can either refill your medications or you can return here when your prescription runs out.     If in any way you are not 100% satisfied,  please tell us.  If 100% better, tell your friends!  Pulmonary follow up is as needed

## 2015-06-29 NOTE — Assessment & Plan Note (Signed)
>   3 min Discussed the risks and costs (both direct and indirect)  of smoking relative to the benefits of quitting but patient unwilling to commit at this point to a specific quit date.    Although I don't endorse regular use of e cigs/ many pts find them helpful; however, I emphasized they should be considered a "one-way bridge" off all tobacco products.  

## 2015-06-29 NOTE — Assessment & Plan Note (Signed)
-  first  seen on cxr RLL 10/14/13 > tickle file for CT 11/23/13  >CT chest 12/16/13 --Multiple bilateral pulmonary nodules measuring up to 8 mm in the right lower lobe, as seen on recent chest x-ray. >repeat CT 06/18/2014 >>No suspicious lung nodule is seen.  Benign-appearing nodules appear completely stable . No mediastinal or hilar adenopathy is seen. >repeat CT chest 06/18/15 > no change and no further studies needed

## 2015-06-29 NOTE — Progress Notes (Signed)
Subjective:    Patient ID: Judith Scott, female    DOB: 1955-05-16  MRN: [id number]    Brief patient profile:  44 yowf  Active smoker dx with copd around 2010 using albuterol neb around twice daily but good activity tolerance then 2014 started on spriva then  x 2 weeks gradually worse > admit  Admit date: 10/13/2013  Discharge date: 10/15/2013    Discharge Diagnoses:  Acute respiratory failure with hypoxia secondary to COPD exacerbation  COPD exacerbation secondary to continued tobacco abuse which is by radiology  Tobacco abuse  Nodular opacity found on x-ray  Hyponatremia and hypochloremia  Hypertension  Hyperlipidemia       10/23/2013 1st [facility] office visit/ Tej Murdaugh / post hosp f/u 02 3lpm and no longer smoking  Chief Complaint  Patient presents with  . Pulmonary Consult    Referred by Dr. Luna Glasgow. Pt states dxed with COPD approx 5 yrs ago. She was had recent exacerbation and was admitted to hospital from 10/13/13 to 10/15/13. She states that her breathing is back to her normal baseline. She has not had to use her rescue inhaler and uses her albuterol neb 4 x per day.   back to baseline activity tol but 02 dep  Cough is clear/ slt yellow worse in ams Confused with how/ when to use saba with poor hfa and dpi so neb dependent  >rx symbicort and spiriva    11/06/2013 Follow up COPD/Chronic O2 depend RF /Lung nodule  Returns for follow up .   Advised per MW to stop Lisinopril, PCP told patient to remain on it but at lower dose - 5mg . Using O2 at night and PRN daytime.  Seen last ov for post hospital follow up .  Started on Symbicort . Remains on Spiriva  Needs PFTs Feels her breathing is better since discharge.  No flare in cough or wheezing  Gets winded with walking incline or long distance.  rec Continue to wear Oxygen 3 l/m with long distance walking and At bedtime  .  Return in 6 weeks with Dr.  And PFT .  Continue on working to quit smoking -most important  goal .  Discuss with family doctor regarding change your Lisinopril to alternative to due to cough.  Continue on Symbicort and Spiriva .  Rinse after all inhaler use.      12/18/2013 f/u ov/Arnez Stoneking re: GOLD III / still smoking  Chief Complaint  Patient presents with  . Follow-up    PFT done today. She is using proair 3-4 x per wk, but has not needed neb. No new co's today.   once improved after aecopd resumed smoking. "just my usual bronchitis cough" worse in ams min white to beige mucus, clears up p first 30 min  Doe x > 100 yards, or if walks fast or does any inclines  rec The key is to stop smoking completely before smoking completely stops you!  Work on inhaler technique:       03/18/2014 f/u ov/Parthena Fergeson re: copd GOLD III still smoking on symb/spiriva no neb saba any longer  Chief Complaint  Patient presents with  . Follow-up    CXR done today. Pt states that her breathing is overall doing well. She is using rescue inhaler 1 x per wk on average.    [place], cutting back on smoking but living in a trailer with Husband Smoking  rec The key is to stop smoking completely before smoking completely stops  you!  We will see you in June after your scan   06/24/2014 f/u ov/Tessla Spurling re: spns/ quit smoking x 2 weeks / spiriva/ symbicort / GOLD III  Copd  Chief Complaint  Patient presents with  . Follow-up    Pt states her breathing is unchanged. She c/o increased wheezing and cough for the past few days. Cough is prod with min clear sputum. She is using rescue inhaler 2-3 x per day and has not needed to use neb.   not needing neb/ walking flat nl pace no problem   rec No change rx    03/19/2015  f/u ov/Keithen Capo re: GOLD III copd/ still smoking 3lpm hs and walkng   Chief Complaint  Patient presents with  . Follow-up    Pt c/o continued cough with yellow mucus and SOB. Pt denies wheeze/CP/tightness. Pt uses 3L O2 when ambulating.   doe on 02 = MMRC1 = can walk nl pace, flat  grade, can't hurry or go uphills or steps s sob   Cough worse in am since restarted smoking / rare need for saba  rec No change in your medications or 02 at this point  The key is to stop smoking completely before smoking completely stops you!    06/29/2015  f/u ov/Sanita Estrada re:  GOLD III still smoking/ symb/spiriva/ 02 3 lpm  Chief Complaint  Patient presents with  . Follow-up    Breathing is at normal baseline for her. She uses albuterol inhaler 1 to 2 times per wk on average and rarely uses neb.   doe continues mmrc=1 Less am congestion despite still smoking   No obvious day to day or daytime variabilty or assoc   cp or chest tightness,  overt sinus or hb symptoms. No unusual exp hx or h/o childhood pna/ asthma or knowledge of premature birth.  Sleeping ok without nocturnal  or early am exacerbation  of respiratory  c/o's or need for noct saba. Also denies any obvious fluctuation of symptoms with weather or environmental changes or other aggravating or alleviating factors except as outlined above   Current Medications, Allergies, Complete Past Medical History, Past Surgical History, Family History, and Social History were reviewed in [employer] record.  ROS  The following are not active complaints unless bolded sore throat, dysphagia, dental problems, itching, sneezing,  nasal congestion or excess/ purulent secretions, ear ache,   fever, chills, sweats, unintended wt loss, pleuritic or exertional cp, hemoptysis,  orthopnea pnd or leg swelling, presyncope, palpitations, heartburn, abdominal pain, anorexia, nausea, vomiting, diarrhea  or change in bowel or urinary habits, change in stools or urine, dysuria,hematuria,  rash, arthralgias, visual complaints, headache, numbness weakness or ataxia or problems with walking or coordination,  change in mood/affect or memory.            Objective:   Physical Exam  amb wf very minimal rattling    06/24/2014       142  >  03/19/2015  144 > 06/29/2015  145     03/18/14 137 lb (62.143 kg)  02/05/14 135 lb (61.236 kg)  12/18/13 137 lb (62.143 kg)    Vital signs reviewed   HEENT mild turbinate edema.  Oropharynx no thrush or excess pnd or cobblestoning.  No JVD or cervical adenopathy. Mild accessory muscle hypertrophy. Trachea midline, nl thryroid. Chest was hyperinflated by percussion with diminished breath sounds and moderate increased exp time without wheeze. Hoover sign positive at mid inspiration. Regular rate and rhythm without murmur  gallop or rub or increase P2 or edema.  Abd: no hsm, nl excursion. Ext warm without cyanosis or clubbing.        I personally reviewed images and agree with radiology impression as follows:  CT Chest   06/18/15 1. Ongoing stability of subpleural pulmonary nodules is consistent with a benign etiology, likely subpleural lymph nodes. 2. No suspicious or enlarging pulmonary nodule identified. 3. Age advanced coronary artery atherosclerosis.         Assessment & Plan:

## 2015-07-17 ENCOUNTER — Other Ambulatory Visit: Payer: Self-pay | Admitting: Adult Health

## 2015-08-18 ENCOUNTER — Emergency Department (HOSPITAL_COMMUNITY)
Admission: EM | Admit: 2015-08-18 | Discharge: 2015-08-18 | Disposition: A | Discharge status: Home / Self Care | Payer: Medicaid Other | Attending: Emergency Medicine | Admitting: Emergency Medicine

## 2015-08-18 ENCOUNTER — Emergency Department (HOSPITAL_COMMUNITY): Payer: Medicaid Other

## 2015-08-18 ENCOUNTER — Encounter (HOSPITAL_COMMUNITY): Payer: Self-pay

## 2015-08-18 DIAGNOSIS — Z7982 Long term (current) use of aspirin: Secondary | ICD-10-CM | POA: Insufficient documentation

## 2015-08-18 DIAGNOSIS — M25531 Pain in right wrist: Secondary | ICD-10-CM | POA: Insufficient documentation

## 2015-08-18 DIAGNOSIS — I10 Essential (primary) hypertension: Secondary | ICD-10-CM | POA: Insufficient documentation

## 2015-08-18 DIAGNOSIS — F17210 Nicotine dependence, cigarettes, uncomplicated: Secondary | ICD-10-CM | POA: Diagnosis not present

## 2015-08-18 DIAGNOSIS — J449 Chronic obstructive pulmonary disease, unspecified: Secondary | ICD-10-CM | POA: Diagnosis not present

## 2015-08-18 MED ORDER — KETOROLAC TROMETHAMINE 30 MG/ML IJ SOLN
30.0000 mg | Freq: Once | INTRAMUSCULAR | Status: AC
  Administered 2015-08-18: 30 mg via INTRAMUSCULAR
  Filled 2015-08-18: qty 1

## 2015-08-18 MED ORDER — NAPROXEN 500 MG PO TABS: 500.0000 mg | ORAL_TABLET | Freq: Two times a day (BID) | ORAL | 0 refills | Status: AC

## 2015-08-18 NOTE — ED Notes (Signed)
Pt refused gown.  

## 2015-08-18 NOTE — ED Notes (Signed)
Rn at bedside

## 2015-08-18 NOTE — ED Triage Notes (Signed)
Pt. C/o right hand swelling x3weeks. Pt. sts pain worse when she first wakes up in the morning and after she moves it a little it gets better. Pt. Denies trying anything besides movement to help. Pt denies hx of same or injury to right hand.   EDP at bedside.

## 2015-08-18 NOTE — ED Notes (Signed)
Pt. Transported to xray at this time.  

## 2015-08-18 NOTE — ED Provider Notes (Signed)
[facility] DEPT Provider Note   CSN: [id number] Arrival date & time: 08/18/15  0744     History   Chief Complaint Chief Complaint  Patient presents with  . Hand Problem    swelling    HPI Aimie Wagman is a 60 y.o. female.  HPI Patient p/w 3wk of R wrist pain.  Onset was atraumatic.  Since onset the pain has been moderate, diffuse, worse w motion.  No med's taken for pain relief.  No other new Sx, including, f/c/n/v, loss of sensation or strength in the wrist.  She has Hx of similar pain in the past in the R elbow, which improved w antiinflammatory med's. No Hx of gout.  She smokes, and we discussed the importance of smoking cessation.   Past Medical History:  Diagnosis Date  . COPD (chronic obstructive pulmonary disease) (HCC)   . COPD exacerbation (HCC) 05/25/2012  . Hypercholesteremia   . Hypertension     Patient Active Problem List   Diagnosis Date Noted  . Cigarette smoker 12/21/2013  . COPD GOLD III  10/23/2013  . Lung nodule 10/23/2013  . Hyperlipidemia 10/13/2013  . Hyponatremia 10/13/2013  . Dehydration 10/13/2013  . Essential hypertension, benign 02/15/2013  . COPD exacerbation (HCC) 05/25/2012  . Chronic respiratory failure with hypoxia (HCC) 05/25/2012    Past Surgical History:  Procedure Laterality Date  . APPENDECTOMY      OB History    No data available       Home Medications    Prior to Admission medications   Medication Sig Start Date End Date Taking? Authorizing Provider  albuterol (PROVENTIL HFA;VENTOLIN HFA) 108 (90 BASE) MCG/ACT inhaler Inhale 2 puffs into the lungs every 4 (four) hours as needed for wheezing or shortness of breath. 10/17/14   Ambrose Finland, NP  aspirin 81 MG chewable tablet Chew 81 mg by mouth daily.    Historical Provider, MD  atorvastatin (LIPITOR) 40 MG tablet TAKE 1 TABLET (40 MG TOTAL) BY MOUTH DAILY. 06/17/15   Quentin Angst, MD  Cholecalciferol (VITAMIN D PO) Take 1 tablet by mouth daily.     Historical Provider, MD  Dextromethorphan Polistirex (DELSYM PO) Take by mouth. Reported on 04/02/2015    Historical Provider, MD  dextromethorphan-guaiFENesin [facility] DM) 30-600 MG 12hr tablet Take 1 tablet by mouth 2 (two) times daily. Reported on 04/02/2015    Historical Provider, MD  ipratropium-albuterol (DUONEB) 0.5-2.5 (3) MG/3ML SOLN USE 1 VIAL IN NEBULIZER EVERY 4 HOURS AS NEEDED 01/01/15   Ambrose Finland, NP  losartan (COZAAR) 50 MG tablet TAKE 1 TABLET BY MOUTH EVERY DAY 04/21/15   Quentin Angst, MD  OXYGEN 2lpm with sleep and as needed during the day    Historical Provider, MD  [facility] 160-4.5 MCG/ACT inhaler INHALE 2 PUFFS INTO THE LUNGS 2 TIMES A DAY 07/17/15   Nyoka Cowden, MD  Tiotropium Bromide Monohydrate (SPIRIVA RESPIMAT) 2.5 MCG/ACT AERS 2 puffs each am 03/19/15   Nyoka Cowden, MD    Family History Family History  Problem Relation Age of Onset  . 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 Smoking status: Current Every Day Smoker    Packs/day: 1.00    Years: 40.00    Types: Cigarettes  . Smokeless tobacco: Never Used  . Alcohol use 0.0 oz/week     Comment: occasional     Allergies  Review of patient's allergies indicates no known allergies.   Review of Systems Review of Systems  Constitutional: Negative for fever.  Respiratory: Positive for cough. Negative for shortness of breath.   Cardiovascular: Negative for chest pain.  Musculoskeletal:       Negative aside from HPI  Skin: Negative for color change.       Negative aside from HPI  Allergic/Immunologic: Negative for immunocompromised state.  Neurological: Negative for weakness.     Physical Exam Updated Vital Signs BP 151/80 (BP Location: Left Arm)   Pulse 81   Temp 97.9 F (36.6 C) (Oral)   Resp (!) 28   SpO2 93%   Physical Exam  Constitutional: She is oriented to person, place, and time. She appears  well-developed and well-nourished. No distress.  HENT:  Head: Normocephalic and atraumatic.  Eyes: Conjunctivae and EOM are normal.  Cardiovascular: Normal rate and regular rhythm.   Pulmonary/Chest: Effort normal and breath sounds normal. No stridor. No respiratory distress.  Abdominal: She exhibits no distension.  Musculoskeletal: She exhibits no edema.       Right elbow: Normal.      Right wrist: She exhibits decreased range of motion and tenderness. She exhibits no bony tenderness, no swelling, no effusion, no crepitus, no deformity and no laceration.  Neurological: She is alert and oriented to person, place, and time. No cranial nerve deficit.  Skin: Skin is warm and dry. No rash noted. No erythema. No pallor.  Psychiatric: She has a normal mood and affect.  Nursing note and vitals reviewed.  Radiology Dg Wrist Complete Right  Result Date: 08/18/2015 CLINICAL DATA:  Chronic pain EXAM: RIGHT WRIST - COMPLETE 3+ VIEW COMPARISON:  January 29, 2014 FINDINGS: Frontal, oblique, lateral, and ulnar deviation scaphoid images were obtained. There is no fracture or dislocation. Joint spaces appear unremarkable. No erosive change. IMPRESSION: No fracture or dislocation.  No apparent arthropathy. Electronically Signed   By: Bretta Bang III M.D.   On: 08/18/2015 08:39    Procedures Procedures (including critical care time)  Medications Ordered in ED Medications  ketorolac (TORADOL) 30 MG/ML injection 30 mg (not administered)    Initial Impression / Assessment and Plan / ED Course  I have reviewed the triage vital signs and the nursing notes.  Pertinent labs & imaging results that were available during my care of the patient were reviewed by me and considered in my medical decision making (see chart for details).  Clinical Course   Patient in no distress  Final Clinical Impressions(s) / ED Diagnoses   Final diagnoses:  Wrist pain, acute, right  Elderly female presents with right  wrist pain, atraumatic. No physical exam findings consistent with septic arthritis, bacteremia, sepsis per History of gout, though this is a small possibility. Patient has no distal neurovascular deficits. She received initial anti-inflammatory here, was discharged with a short course of anti-inflammatories, orthopedics follow-up.   New Prescriptions New Prescriptions   NAPROXEN (NAPROSYN) 500 MG TABLET    Take 1 tablet (500 mg total) by mouth 2 (two) times daily.     Gerhard Munch, MD 08/18/15 0900

## 2015-08-18 NOTE — ED Notes (Signed)
Family at bedside. Son 

## 2015-08-18 NOTE — ED Notes (Signed)
PA student at bedside.

## 2015-08-27 ENCOUNTER — Emergency Department (HOSPITAL_COMMUNITY)
Admission: EM | Admit: 2015-08-27 | Discharge: 2015-08-27 | Disposition: A | Discharge status: Home / Self Care | Payer: Medicaid Other | Attending: Emergency Medicine | Admitting: Emergency Medicine

## 2015-08-27 ENCOUNTER — Encounter (HOSPITAL_COMMUNITY): Payer: Self-pay | Admitting: *Deleted

## 2015-08-27 DIAGNOSIS — Z79899 Other long term (current) drug therapy: Secondary | ICD-10-CM | POA: Diagnosis not present

## 2015-08-27 DIAGNOSIS — I10 Essential (primary) hypertension: Secondary | ICD-10-CM | POA: Insufficient documentation

## 2015-08-27 DIAGNOSIS — Z7982 Long term (current) use of aspirin: Secondary | ICD-10-CM | POA: Insufficient documentation

## 2015-08-27 DIAGNOSIS — F17210 Nicotine dependence, cigarettes, uncomplicated: Secondary | ICD-10-CM | POA: Diagnosis not present

## 2015-08-27 DIAGNOSIS — J449 Chronic obstructive pulmonary disease, unspecified: Secondary | ICD-10-CM | POA: Diagnosis not present

## 2015-08-27 DIAGNOSIS — M25531 Pain in right wrist: Secondary | ICD-10-CM | POA: Insufficient documentation

## 2015-08-27 MED ORDER — NAPROXEN 500 MG PO TABS: 500.0000 mg | ORAL_TABLET | Freq: Two times a day (BID) | ORAL | 0 refills | Status: DC

## 2015-08-27 NOTE — ED Notes (Signed)
Thumb spica cast applied  Pt. Verbalizes understanding of application

## 2015-08-27 NOTE — ED Triage Notes (Signed)
Pt states was seen here recently for r wrist pain.  Was given naproxen for pain and told to see ortho.  Unable to get appointment until 9/7 and pain is unbearable.

## 2015-08-27 NOTE — ED Provider Notes (Signed)
[facility] Provider Note   CSN: [id number] Arrival date & time: 08/27/15  0745     History   Chief Complaint Chief Complaint  Patient presents with  . Wrist Pain    HPI Nyeisha Denzler is a 60 y.o. female.  HPI   59 YOF presents today with wrist pain. Patient reports for the last 3 weeks she's had wrist pain. Patient was seen in the emergency room, prescribed naproxen and encouraged follow-up with orthopedics. Patient reports that she's been taking the naproxen which relieves her symptoms, has consult at orthopedics and has a follow-up evaluation in September. She reports that she ran and naproxen and would like a refill for the pain. She reports the pain is improved in the mornings after wakening, no swelling to the wrist, she notes that today the wrist is been as to swell, and become more painful. She reports pain is worse with movement of the thumb and wrist, pain located over the radial aspect of the wrist with some tenderness along the ulnar aspect. Loss of sensation strength or motor function, full flexion of the wrist noted.   Past Medical History:  Diagnosis Date  . COPD (chronic obstructive pulmonary disease) (HCC)   . COPD exacerbation (HCC) 05/25/2012  . Hypercholesteremia   . Hypertension     Patient Active Problem List   Diagnosis Date Noted  . Cigarette smoker 12/21/2013  . COPD GOLD III  10/23/2013  . Lung nodule 10/23/2013  . Hyperlipidemia 10/13/2013  . Hyponatremia 10/13/2013  . Dehydration 10/13/2013  . Essential hypertension, benign 02/15/2013  . COPD exacerbation (HCC) 05/25/2012  . Chronic respiratory failure with hypoxia (HCC) 05/25/2012    Past Surgical History:  Procedure Laterality Date  . APPENDECTOMY      OB History    No data available      Home Medications    Prior to Admission medications   Medication Sig Start Date End Date Taking? Authorizing Provider  albuterol (PROVENTIL HFA;VENTOLIN HFA) 108 (90 BASE) MCG/ACT inhaler  Inhale 2 puffs into the lungs every 4 (four) hours as needed for wheezing or shortness of breath. 10/17/14   Ambrose Finland, NP  aspirin 81 MG chewable tablet Chew 81 mg by mouth daily.    Historical Provider, MD  atorvastatin (LIPITOR) 40 MG tablet TAKE 1 TABLET (40 MG TOTAL) BY MOUTH DAILY. 06/17/15   Quentin Angst, MD  Cholecalciferol (VITAMIN D PO) Take 1 tablet by mouth daily.    Historical Provider, MD  Dextromethorphan Polistirex (DELSYM PO) Take by mouth. Reported on 04/02/2015    Historical Provider, MD  dextromethorphan-guaiFENesin [facility] DM) 30-600 MG 12hr tablet Take 1 tablet by mouth 2 (two) times daily. Reported on 04/02/2015    Historical Provider, MD  ipratropium-albuterol (DUONEB) 0.5-2.5 (3) MG/3ML SOLN USE 1 VIAL IN NEBULIZER EVERY 4 HOURS AS NEEDED 01/01/15   Ambrose Finland, NP  losartan (COZAAR) 50 MG tablet TAKE 1 TABLET BY MOUTH EVERY DAY 04/21/15   Quentin Angst, MD  naproxen (NAPROSYN) 500 MG tablet Take 1 tablet (500 mg total) by mouth 2 (two) times daily. 08/27/15   Eyvonne Mechanic, PA-C  OXYGEN 2lpm with sleep and as needed during the day    Historical Provider, MD  [facility] 160-4.5 MCG/ACT inhaler INHALE 2 PUFFS INTO THE LUNGS 2 TIMES A DAY 07/17/15   Nyoka Cowden, MD  Tiotropium Bromide Monohydrate (SPIRIVA RESPIMAT) 2.5 MCG/ACT AERS 2 puffs each am 03/19/15   Nyoka Cowden, MD  Family History Family History  Problem Relation Age of Onset  . 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 Smoking status: Current Every Day Smoker    Packs/day: 1.50    Years: 40.00    Types: Cigarettes  . Smokeless tobacco: Never Used  . Alcohol use 0.0 oz/week     Comment: occasional     Allergies   Review of patient's allergies indicates no known allergies.   Review of Systems Review of Systems  All other systems reviewed and are negative.   Physical Exam Updated Vital Signs BP  147/72 (BP Location: Left Arm)   Pulse 79   Temp 98.2 F (36.8 C) (Oral)   Resp 22   Ht 5\' 4"  (1.626 m)   Wt 66.1 kg   SpO2 94%   BMI 25.03 kg/m   Physical Exam  Constitutional: She is oriented to person, place, and time. She appears well-developed and well-nourished.  HENT:  Head: Normocephalic and atraumatic.  Eyes: Conjunctivae are normal. Pupils are equal, round, and reactive to light. Right eye exhibits no discharge. Left eye exhibits no discharge. No scleral icterus.  Neck: Normal range of motion. No JVD present. No tracheal deviation present.  Pulmonary/Chest: Effort normal. No stridor.  Musculoskeletal:  Right wrist with no obvious swelling or edema, tenderness to palpation of the wrist along the radial and ulnar aspects, worse with flexion and extension of the wrist, full flexion on exam. Pain worsened with range of motion of the thumb. Sensation intact, radial pulses 2+, no pain to palpation of the volar wrist.  Neurological: She is alert and oriented to person, place, and time. Coordination normal.  Psychiatric: She has a normal mood and affect. Her behavior is normal. Judgment and thought content normal.  Nursing note and vitals reviewed.    ED Treatments / Results  Labs (all labs ordered are listed, but only abnormal results are displayed) Labs Reviewed - No data to display  EKG  EKG Interpretation None       Radiology No results found.  Procedures Procedures (including critical care time)  Medications Ordered in ED Medications - No data to display   Initial Impression / Assessment and Plan / ED Course  I have reviewed the triage vital signs and the nursing notes.  Pertinent labs & imaging results that were available during my care of the patient were reviewed by me and considered in my medical decision making (see chart for details).  Clinical Course    Final Clinical Impressions(s) / ED Diagnoses   Final diagnoses:  Right wrist pain     Labs:   Imaging:   Consults:  Therapeutics:  Discharge Meds:   Assessment/Plan: 60 year old female presents today with wrist pain. This is like inflammatory nature, she has no signs of infectious etiology on my exam, has been present for 3 weeks improvement with naproxen. Patient thought the naproxen was a prescription medication and did not attempt buying over-the-counter. She is requesting a refill on her naproxen, fundus appropriate for this patient. She has follow up with orthopedics and the plan, follow-up with her primary care provider next week. Patient given strict return precautions, she verbalized understanding and agreement today's plan had no further questions concerns      New Prescriptions New Prescriptions   NAPROXEN (NAPROSYN) 500 MG TABLET    Take 1 tablet (500 mg total) by mouth 2 (two) times  daily.     Eyvonne Mechanic, PA-C 08/27/15 0902    Eyvonne Mechanic, PA-C 08/27/15 0947    Loren Racer, MD 09/02/15 1235

## 2015-08-27 NOTE — ED Notes (Signed)
D/C instructions reviewed with pt. And spica splint applied. Pt. Verbalizes understanding and leaving with family

## 2015-08-27 NOTE — Discharge Instructions (Signed)
Please read attached information. If you experience any new or worsening signs or symptoms please return to the emergency room for evaluation. Please follow-up with your primary care provider or specialist as discussed. Please use medication prescribed only as directed and discontinue taking if you have any concerning signs or symptoms.   °

## 2015-09-02 ENCOUNTER — Encounter: Payer: Self-pay | Admitting: Family Medicine

## 2015-09-02 ENCOUNTER — Ambulatory Visit (INDEPENDENT_AMBULATORY_CARE_PROVIDER_SITE_OTHER): Payer: Medicaid Other | Admitting: Family Medicine

## 2015-09-02 VITALS — BP 140/90 | HR 86 | Temp 98.2°F | Ht 64.0 in | Wt 146.0 lb

## 2015-09-02 DIAGNOSIS — Z1231 Encounter for screening mammogram for malignant neoplasm of breast: Secondary | ICD-10-CM | POA: Diagnosis not present

## 2015-09-02 DIAGNOSIS — M25531 Pain in right wrist: Secondary | ICD-10-CM | POA: Diagnosis not present

## 2015-09-02 DIAGNOSIS — E785 Hyperlipidemia, unspecified: Secondary | ICD-10-CM | POA: Diagnosis present

## 2015-09-02 DIAGNOSIS — F17210 Nicotine dependence, cigarettes, uncomplicated: Secondary | ICD-10-CM | POA: Diagnosis not present

## 2015-09-02 DIAGNOSIS — Z23 Encounter for immunization: Secondary | ICD-10-CM

## 2015-09-02 DIAGNOSIS — I10 Essential (primary) hypertension: Secondary | ICD-10-CM

## 2015-09-02 DIAGNOSIS — Z720 Tobacco use: Secondary | ICD-10-CM | POA: Diagnosis not present

## 2015-09-02 DIAGNOSIS — M25539 Pain in unspecified wrist: Secondary | ICD-10-CM | POA: Insufficient documentation

## 2015-09-02 LAB — LIPID PANEL
Cholesterol: 186 mg/dL (ref 125–200)
HDL: 59 mg/dL (ref >=46)
LDL Cholesterol: 106 mg/dL (ref <130)
Total CHOL/HDL Ratio: 3.2 Ratio (ref <=5.0)
Triglycerides: 103 mg/dL (ref <150)
VLDL: 21 mg/dL (ref <30)

## 2015-09-02 NOTE — Patient Instructions (Signed)
   We will send you a letter with lab results.   Please let me know if you need a diagnostic mammogram order placed, just call if you need this.

## 2015-09-02 NOTE — Assessment & Plan Note (Signed)
  Currently smokes a pack a day, patch is only thing that has helped in the past but not currently using. Patient is aware quitting smoking is the only thing that will stop her COPD from worsening.  -not currently interested in smoking cessation -offered buproprion and chantix, patient not interested at this time -advised to return if she would like to discuss smoking cessation/strategies to quit

## 2015-09-02 NOTE — Assessment & Plan Note (Addendum)
  Currently on atorvastatin 40 mg daily, ASCVD 13.9%  -continue current statin medication -will check lipid panel today -continue baby aspirin daily -will notify patient of results and make changes if necessary but do not anticipate changes are needed

## 2015-09-02 NOTE — Assessment & Plan Note (Signed)
  At goal of <150/90 today  -continue with losartan daily

## 2015-09-02 NOTE — Progress Notes (Signed)
Subjective:    Patient ID: Judith Scott, female    DOB: [birth year], 60 y.o.   MRN: [id number]   Judith Scott is a 60 year old female here to establish care.   Current complaints include joint paints and need for mammography  Was due to get mammogram in April but was unable to make an appointment because she relayed right breast pain to scheduler who then said she would need a referral for diagnostic mammogram. The breast pain was due to her 19 year old granddaughter running into her chest and has since resolved, no new breast lumps or concerns at this time.  Right wrist has been painful and swelling on and off for past 1-2 months. Has been to ED twice for this, Xray did not reveal any fracture. Was given naprosyn which helped with pain. Has appointment with orthopedist today at 2 pm. Also feels like the same pain is starting in her left wrist and right ankle. No swelling at these sites yet. Denies history of arthritis. No other joint pains or swelling. No rashes, denies fatigue.  Sees pulmonologist for COPD yearly, gets yearly low dose CT scans. Last scan showed LAD atherosclerosis. Small pleural nodule that is being monitored. Patient is already on statin therapy and aspirin daily. Denies chest pain with exertion or at rest. Denies shortness of breath, recent weight loss, night sweats, fevers, chills.   Patient is unwilling to quit smoking at this time, smokes 1 pack per day. Does not want to try wellbutrin or varenicline at this time. Patch has worked best for her in the past but she is not ready to try again at this point. She understands that her COPD will only worsen if she continues to smoke. Agrees to come back if she wants help in quitting in the future.   PMH- COPD on 3L O2 at home, HTN, HLD Meds-  Current Outpatient Prescriptions:  .  albuterol (PROVENTIL HFA;VENTOLIN HFA) 108 (90 BASE) MCG/ACT inhaler, Inhale 2 puffs into the lungs every 4 (four) hours as needed for wheezing or  shortness of breath., Disp: 1 Inhaler, Rfl: 3 .  aspirin 81 MG chewable tablet, Chew 81 mg by mouth daily., Disp: , Rfl:  .  atorvastatin (LIPITOR) 40 MG tablet, TAKE 1 TABLET (40 MG TOTAL) BY MOUTH DAILY., Disp: 30 tablet, Rfl: 2 .  Cholecalciferol (VITAMIN D PO), Take 1 tablet by mouth daily., Disp: , Rfl:  .  Dextromethorphan Polistirex (DELSYM PO), Take by mouth. Reported on 04/02/2015, Disp: , Rfl:  .  dextromethorphan-guaiFENesin (MUCINEX DM) 30-600 MG 12hr tablet, Take 1 tablet by mouth 2 (two) times daily. Reported on 04/02/2015, Disp: , Rfl:  .  ipratropium-albuterol (DUONEB) 0.5-2.5 (3) MG/3ML SOLN, USE 1 VIAL IN NEBULIZER EVERY 4 HOURS AS NEEDED, Disp: 360 mL, Rfl: 0 .  losartan (COZAAR) 50 MG tablet, TAKE 1 TABLET BY MOUTH EVERY DAY, Disp: 30 tablet, Rfl: 3 .  naproxen (NAPROSYN) 500 MG tablet, Take 1 tablet (500 mg total) by mouth 2 (two) times daily., Disp: 30 tablet, Rfl: 0 .  OXYGEN, 2lpm with sleep and as needed during the day, Disp: , Rfl:  .  SYMBICORT 160-4.5 MCG/ACT inhaler, INHALE 2 PUFFS INTO THE LUNGS 2 TIMES A DAY, Disp: 10.2 Inhaler, Rfl: 6 .  Tiotropium Bromide Monohydrate (SPIRIVA RESPIMAT) 2.5 MCG/ACT AERS, 2 puffs each am, Disp: 1 Inhaler, Rfl: 11  Allergies: none Surgeries: appendectomy as child SH- smokes 1 pack a day, denies alcohol and drug use, Lives  home alone, retired FH- mother, brother with lung cancer, father's side history of CAD/MI    Objective:  BP 140/90   Pulse 86   Temp 98.2 F (36.8 C) (Oral)   Ht 5\' 4"  (1.626 m)   Wt 146 lb (66.2 kg)   SpO2 90%   BMI 25.06 kg/m  Vitals and nursing note reviewed  General: well nourished, in NAD HEENT: normocephalic, atraumatic, no scleral icterus or conjunctival pallor noted bilaterally, PERRL, no nasal discharge, moist mucous membranes, no erythema or exudate in posterior oropharynx, good dentition. Neck: supple, non-tender, without lymphadenopathy. Small fatty lipoma at lateral left clavicle. Cardiac:  RRR, clear S1 and S2, no murmurs, rubs, or gallops Respiratory: clear to auscultation bilaterally, no increased work of breathing Abdomen: soft, nontender, nondistended, no masses or organomegaly. Bowel sounds normoactive in all 4 quadrants. Extremities: no edema or cyanosis. Warm, well perfused. 2+ radial and PT pulses bilaterally Skin: warm and dry, no rashes noted, seborrheic keratosis on stomach Neuro: alert and oriented, no focal deficits, normal gait, 5/5 strength in upper and lower extremities bilaterally   Assessment & Plan:    Hyperlipidemia  Currently on atorvastatin 40 mg daily, ASCVD 13.9%  -continue current statin medication -will check lipid panel today -continue baby aspirin daily -will notify patient of results and make changes if necessary but do not anticipate changes are needed  Essential hypertension, benign  At goal of <150/90 today  -continue with losartan daily  Cigarette smoker  Currently smokes a pack a day, patch is only thing that has helped in the past but not currently using. Patient is aware quitting smoking is the only thing that will stop her COPD from worsening.  -not currently interested in smoking cessation -offered buproprion and chantix, patient not interested at this time -advised to return if she would like to discuss smoking cessation/strategies to quit  Wrist arthralgia  Present past 1-2 months, has appointment to see ortho this afternoon  -continue naprosyn for pain -follow with ortho recs -return to clinic if fails to improve with ortho recs  Health Care Maintenance Patient given information to make appointment for screening mammogram If diagnostic mammo is needed patient will call and I can put the referral in, no need for appointment Patient will get flu shot today Follow up in 6 months or sooner if needed  , DO Family Medicine Resident PGY-1

## 2015-09-02 NOTE — Assessment & Plan Note (Signed)
  Present past 1-2 months, has appointment to see ortho this afternoon  -continue naprosyn for pain -follow with ortho recs -return to clinic if fails to improve with ortho recs

## 2015-09-03 ENCOUNTER — Telehealth: Payer: Self-pay

## 2015-09-03 NOTE — Telephone Encounter (Signed)
-----   Message from Tillman Sers, DO sent at 09/03/2015  8:17 AM EDT -----  Please let Ms. Rua know her cholesterol levels were all good. No need to change any medications. Thank you!

## 2015-09-03 NOTE — Telephone Encounter (Signed)
Pt informed

## 2015-09-14 ENCOUNTER — Other Ambulatory Visit: Payer: Self-pay | Admitting: Internal Medicine

## 2015-09-16 ENCOUNTER — Other Ambulatory Visit: Payer: Self-pay | Admitting: *Deleted

## 2015-09-16 ENCOUNTER — Other Ambulatory Visit: Payer: Self-pay | Admitting: Internal Medicine

## 2015-09-16 MED ORDER — ATORVASTATIN CALCIUM 40 MG PO TABS: ORAL_TABLET | ORAL | 2 refills | Status: DC

## 2015-09-20 ENCOUNTER — Other Ambulatory Visit: Payer: Self-pay | Admitting: Internal Medicine

## 2015-09-20 DIAGNOSIS — I10 Essential (primary) hypertension: Secondary | ICD-10-CM

## 2015-10-10 ENCOUNTER — Other Ambulatory Visit: Payer: Self-pay | Admitting: Internal Medicine

## 2015-10-10 DIAGNOSIS — I10 Essential (primary) hypertension: Secondary | ICD-10-CM

## 2015-10-28 ENCOUNTER — Ambulatory Visit: Payer: Medicaid Other

## 2015-12-08 ENCOUNTER — Other Ambulatory Visit: Payer: Self-pay | Admitting: Family Medicine

## 2015-12-08 MED ORDER — ATORVASTATIN CALCIUM 40 MG PO TABS: ORAL_TABLET | ORAL | 2 refills | Status: DC

## 2015-12-08 NOTE — Telephone Encounter (Signed)
Pt needs a refill on atorvastatin. [facility] [address] Please advise. Thanks! ep

## 2016-01-08 ENCOUNTER — Telehealth: Payer: Self-pay | Admitting: *Deleted

## 2016-01-08 NOTE — Telephone Encounter (Signed)
Prior Authorization received from [facility] for Losartan 50 mg. PA approved via [employer] 01/08/16-01/02/17. PA approval number [id number]. Clovis Pu, RN

## 2016-02-01 ENCOUNTER — Other Ambulatory Visit: Payer: Self-pay | Admitting: Internal Medicine

## 2016-02-09 ENCOUNTER — Ambulatory Visit (HOSPITAL_COMMUNITY)
Admission: RE | Admit: 2016-02-09 | Discharge: 2016-02-09 | Disposition: A | Discharge status: Home / Self Care | Payer: Medicaid Other | Source: Ambulatory Visit | Attending: Family Medicine | Admitting: Family Medicine

## 2016-02-09 ENCOUNTER — Encounter: Payer: Self-pay | Admitting: Family Medicine

## 2016-02-09 ENCOUNTER — Ambulatory Visit (INDEPENDENT_AMBULATORY_CARE_PROVIDER_SITE_OTHER): Payer: Medicaid Other | Admitting: Family Medicine

## 2016-02-09 VITALS — BP 122/78 | HR 97 | Temp 98.0°F | Wt 144.0 lb

## 2016-02-09 DIAGNOSIS — R0609 Other forms of dyspnea: Secondary | ICD-10-CM

## 2016-02-09 NOTE — Patient Instructions (Signed)
I do not think you are having a COPD flare. If you start having cough or wheeze or sputum production, let us know.  I will send in a referral for you to see the cardiologist.   Take care,  Dr Jimmey Ralph

## 2016-02-09 NOTE — Progress Notes (Signed)
   Subjective:  Judith Scott is a 61 y.o. female who presents to the [facility] today with a chief complaint of shortness of breath.   HPI:  Shortness of Breath Started 3 days ago. Feels ok at rest, but has noticed that she is more short of breath with walking. No obvious precipitating events. Granddaughter had a URI but the patient has no increased cough, wheeze, or sputum production. Had some chest pain 3 days ago on the right side of her chest but none over the last couple of days. Patient has tried albuterol, which helps. No orthopnea. No PND. Patient is on her baseline 3L oxygen. No history of blood clots. No long car rides or other immobilization. No swelling in legs.  ROS: Per HPI  PMH: Smoking history reviewed.   Objective:  Physical Exam: BP 122/78   Pulse 97   Temp 98 F (36.7 C) (Oral)   Wt 144 lb (65.3 kg)   SpO2 91%   BMI 24.72 kg/m   Gen: NAD, resting comfortably, speaking in full sentences.  CV: RRR with no murmurs appreciated Pulm: NWOB, [place] in place. Good air movement in upper fields, somewhat diminished in lower fields. No crackles noted. Occasional scattered end expiratory wheeze noted throughout.  MSK: LE with no edema. Equal in size bilaterally.  Skin: warm, dry Neuro: grossly normal, moves all extremities Psych: Normal affect and thought content  EKG: NSR, no signs of acute ischemia  Assessment/Plan:  Dyspnea on exertion No signs of COPD exacerbation given that she has no increased wheeze, cough, or sputum production, however it may be possible that this is her new base line. She was able to tolerate walking around the clinic and maintained sats to 89% on her baseline 3L. Patient is at a very high risk for cardiac pathology and needs further risk stratification - will refer to cardiology for stress testing. Wells Score 0 - doubt PE. Strict return precautions reviewed.   Katina Degree. Jimmey Ralph, MD [facility] Family Medicine Resident PGY-3 02/09/2016 10:53 AM

## 2016-02-09 NOTE — Assessment & Plan Note (Signed)
No signs of COPD exacerbation given that she has no increased wheeze, cough, or sputum production, however it may be possible that this is her new base line. She was able to tolerate walking around the clinic and maintained sats to 89% on her baseline 3L. Patient is at a very high risk for cardiac pathology and needs further risk stratification - will refer to cardiology for stress testing. Wells Score 0 - doubt PE. Strict return precautions reviewed.

## 2016-02-17 ENCOUNTER — Encounter: Payer: Self-pay | Admitting: Physician Assistant

## 2016-02-17 ENCOUNTER — Ambulatory Visit (INDEPENDENT_AMBULATORY_CARE_PROVIDER_SITE_OTHER): Payer: Medicaid Other | Admitting: Physician Assistant

## 2016-02-17 VITALS — BP 150/84 | HR 82 | Ht 64.0 in | Wt 147.0 lb

## 2016-02-17 DIAGNOSIS — R002 Palpitations: Secondary | ICD-10-CM

## 2016-02-17 DIAGNOSIS — I10 Essential (primary) hypertension: Secondary | ICD-10-CM

## 2016-02-17 DIAGNOSIS — J449 Chronic obstructive pulmonary disease, unspecified: Secondary | ICD-10-CM | POA: Diagnosis not present

## 2016-02-17 DIAGNOSIS — R0609 Other forms of dyspnea: Secondary | ICD-10-CM

## 2016-02-17 DIAGNOSIS — E785 Hyperlipidemia, unspecified: Secondary | ICD-10-CM

## 2016-02-17 DIAGNOSIS — R079 Chest pain, unspecified: Secondary | ICD-10-CM | POA: Diagnosis not present

## 2016-02-17 NOTE — Patient Instructions (Addendum)
Medication Instructions:  Your physician recommends that you continue on your current medications as directed. Please refer to the Current Medication list given to you today.  Labwork: None   Testing/Procedures: Your physician has requested that you have an echocardiogram. Echocardiography is a painless test that uses sound waves to create images of your heart. It provides your doctor with information about the size and shape of your heart and how well your heart's chambers and valves are working. This procedure takes approximately one hour. There are no restrictions for this procedure.  Follow-Up: Your physician recommends that you schedule a follow-up appointment in: 2 months with Azalee Course, PA  Any Other Special Instructions Will Be Listed Below (If Applicable).  If you need a refill on your cardiac medications before your next appointment, please call your pharmacy.  Next appointment   Date:_________________________________  Time:__________________________am/pm

## 2016-02-17 NOTE — Progress Notes (Signed)
Cardiology Office Note    Date:  02/17/2016   ID:  Judith Scott, DOB 11/11/1955, MRN [id number]  PCP:  Tillman Sers, DO  Cardiologist:  New  - (case discussed with Dr. Herbie Baltimore)  Chief Complaint  Patient presents with  . New Patient (Initial Visit)    referred to Dr. Herbie Baltimore, workup for DOE    History of Present Illness:  Judith Scott is a 61 y.o. female with PMH of HTN, HLD and COPD present to cardiology office for evaluation of dyspnea on exertion. She had PFT done in December 2015 that showed severe obstructive disease with possible restrictive disease, severe diffusion defect. She has been followed by Dr. Sandrea Hughs with pulmonology service. Last CT of the chest obtained in June 2017 does show mild coronary artery atherosclerosis. She was recently seen by family medicine on 02/09/2016 for dyspnea on exertion.   She presents today for evaluation of dyspnea on exertion. She continues to smoke a pack and a half for the past 40 years. On physical exam, she has markedly diminished breath sounds bilaterally with prolonged expiratory phase consistent with severe COPD. She said she had an episode of chest pain 2 weeks ago at rest. She says she was emotionally stressed as her daughter had a stroke 3 years ago and she has a 22-year-old granddaughter. Her daughter was a single mother. The episodes chest pain lasted roughly 10 minutes before going away. She has not had any recurrent chest pain since. She denies ever experiencing any exertional type chest pain before or since the event. She is quite aware of her severe COPD. She has been using as needed 3 L nasal cannula at home. She said she usually get short of breath walking from the bedroom to the bathroom. According to the patient, she does not think her dyspnea on exertion has significantly worsened from baseline for her. Her EKG obtained at family medicine service a few days ago showed a normal sinus rhythm without significant ST-T wave changes.  Also she has been having palpitation episode as well, this occurs on a rare basis.    Past Medical History:  Diagnosis Date  . COPD (chronic obstructive pulmonary disease) (HCC)   . COPD exacerbation (HCC) 05/25/2012  . Hypercholesteremia   . Hypertension     Past Surgical History:  Procedure Laterality Date  . APPENDECTOMY      Current Medications: Outpatient Medications Prior to Visit  Medication Sig Dispense Refill  . albuterol (PROVENTIL HFA;VENTOLIN HFA) 108 (90 BASE) MCG/ACT inhaler Inhale 2 puffs into the lungs every 4 (four) hours as needed for wheezing or shortness of breath. 1 Inhaler 3  . aspirin 81 MG chewable tablet Chew 81 mg by mouth daily.    Marland Kitchen atorvastatin (LIPITOR) 40 MG tablet TAKE 1 TABLET (40 MG TOTAL) BY MOUTH DAILY. 30 tablet 2  . Cholecalciferol (VITAMIN D PO) Take 1 tablet by mouth daily.    Marland Kitchen dextromethorphan-guaiFENesin (MUCINEX DM) 30-600 MG 12hr tablet Take 1 tablet by mouth 2 (two) times daily. Reported on 04/02/2015    . ipratropium-albuterol (DUONEB) 0.5-2.5 (3) MG/3ML SOLN USE 1 VIAL IN NEBULIZER EVERY 4 HOURS AS NEEDED 360 mL 0  . losartan (COZAAR) 50 MG tablet TAKE 1 TABLET BY MOUTH EVERY DAY 30 tablet 5  . OXYGEN 2lpm with sleep and as needed during the day    . SYMBICORT 160-4.5 MCG/ACT inhaler INHALE 2 PUFFS INTO THE LUNGS 2 TIMES A DAY 10.2 Inhaler 5  . Tiotropium Bromide  Monohydrate (SPIRIVA RESPIMAT) 2.5 MCG/ACT AERS 2 puffs each am 1 Inhaler 11  . Dextromethorphan Polistirex (DELSYM PO) Take by mouth. Reported on 04/02/2015    . naproxen (NAPROSYN) 500 MG tablet Take 1 tablet (500 mg total) by mouth 2 (two) times daily. 30 tablet 0   No facility-administered medications prior to visit.      Allergies:   Patient has no known allergies.   Social History   Social History  . Marital status: Legally Separated    Spouse name: N/A  . Number of children: N/A  . Years of education: N/A   Social History Main Topics  . Smoking status: Current  Every Day Smoker    Packs/day: 1.50    Years: 40.00    Types: Cigarettes  . Smokeless tobacco: Never Used  . Alcohol use 0.0 oz/week     Comment: occasional  . Drug use: No  . Sexual activity: Yes    Birth control/ protection: Post-menopausal   Other Topics Concern  . None   Social History Narrative   epworth sleepiness scale score: 2     Family History:  The patient's family history includes Lung cancer in her mother.   ROS:   Please see the history of present illness.    ROS All other systems reviewed and are negative.   PHYSICAL EXAM:   VS:  BP (!) 150/84 (BP Location: Right Arm)   Pulse 82   Ht 5\' 4"  (1.626 m)   Wt 147 lb (66.7 kg)   BMI 25.23 kg/m    GEN: Well nourished, well developed, in no acute distress  HEENT: normal  Neck: no JVD, carotid bruits, or masses Cardiac: RRR; no murmurs, rubs, or gallops,no edema  Respiratory:  Markedly diminished breath sounds with prolonged expiratory phase GI: soft, nontender, nondistended, + BS MS: no deformity or atrophy  Skin: warm and dry, no rash Neuro:  Alert and Oriented x 3, Strength and sensation are intact Psych: euthymic mood, full affect  Wt Readings from Last 3 Encounters:  02/17/16 147 lb (66.7 kg)  02/09/16 144 lb (65.3 kg)  09/02/15 146 lb (66.2 kg)      Studies/Labs Reviewed:   EKG:  EKG is not ordered today.   Recent Labs: No results found for requested labs within last 8760 hours.   Lipid Panel    Component Value Date/Time   CHOL 186 09/02/2015 0955   TRIG 103 09/02/2015 0955   HDL 59 09/02/2015 0955   CHOLHDL 3.2 09/02/2015 0955   VLDL 21 09/02/2015 0955   LDLCALC 106 09/02/2015 0955    Additional studies/ records that were reviewed today include:   EKG obtained at family medicine service on 02/09/2016.  PFT 12/18/2013 Conclusions: Although there is severe airway obstruction and a diffusion defect suggesting emphysema, the absence of overinflation indicates a concurrent Pulmonary  Function Diagnosis: Severe Obstructive Airways Disease Restriction -Possible Severe Diffusion Defect   CT of chest wo contrast 06/18/2015 IMPRESSION: 1. Ongoing stability of subpleural pulmonary nodules is consistent with a benign etiology, likely subpleural lymph nodes. 2. No suspicious or enlarging pulmonary nodule identified. 3. Age advanced coronary artery atherosclerosis. Recommend assessment of coronary risk factors and consideration of medical therapy. 4. Small pericardial effusion, similar.   ASSESSMENT:    1. Chest pain, unspecified type   2. Palpitation   3. Dyspnea on exertion   4. Chronic obstructive pulmonary disease, unspecified COPD type (HCC)   5. Essential hypertension   6. Hyperlipidemia, unspecified hyperlipidemia type  PLAN:  In order of problems listed above:  1. Chest pain at rest: She had one episode of chest pain, no chest discomfort before or since then. She has no obvious exertional type of symptom. She is not a good candidate for Lexiscan stress test given severe COPD. She would be unable to ambulate on the treadmill for long. At this time given only one episode of chest pain, I recommended observation. We will obtain echocardiogram to establish baseline. I will see the patient back in 2 months, if she has any recurrence of the symptom, we will consider coronary CT with FFR. I discussed the case with Dr. Herbie Baltimore DOD who is also agreeable.  2. Palpitation: She has occasional palpitation however this has not bothered her too much. She is not a candidate for beta blocker given his severe lung disease. She may be able to tolerate diltiazem, however given her to the symptom, I wished to hold off. If she does have increased frequency, we will consider a monitor prior to medication.  3. Dyspnea on exertion: This is the reason for referral to cardiology service, however she has very bad pulmonary function. She has baseline dyspnea on exertion, according to the  patient, she does not think her recent dyspnea on exertion is noticeably worse. Of course she would be at higher risk for pulmonary infection and desaturation, she is well aware that she need to monitor for any sign of fever, chill, or cough.  4. HTN: Continue losartan 50 mg daily. May consider adding diltiazem if her palpitation become worse.  5. HLD: Continue Lipitor 40 mg daily    Medication Adjustments/Labs and Tests Ordered: Current medicines are reviewed at length with the patient today.  Concerns regarding medicines are outlined above.  Medication changes, Labs and Tests ordered today are listed in the Patient Instructions below. Patient Instructions  Medication Instructions:  Your physician recommends that you continue on your current medications as directed. Please refer to the Current Medication list given to you today.  Labwork: None   Testing/Procedures: Your physician has requested that you have an echocardiogram. Echocardiography is a painless test that uses sound waves to create images of your heart. It provides your doctor with information about the size and shape of your heart and how well your heart's chambers and valves are working. This procedure takes approximately one hour. There are no restrictions for this procedure.  Follow-Up: Your physician recommends that you schedule a follow-up appointment in: 2 months with Azalee Course, PA  Any Other Special Instructions Will Be Listed Below (If Applicable).  If you need a refill on your cardiac medications before your next appointment, please call your pharmacy.  Next appointment   Date:_________________________________  Time:__________________________am/pm    Ramond Dial, PA  02/17/2016 5:08 PM    [facility] [address] [place], [place], [place]  [address] Phone: [phone]; Fax: [phone]

## 2016-02-18 ENCOUNTER — Telehealth: Payer: Self-pay | Admitting: *Deleted

## 2016-02-18 NOTE — Telephone Encounter (Signed)
Pts grandaughter (that she lives with) was just dx with Influenza B.  Pt is asymptomatic at this time, but has chronic lung conditions. Wants to know if she should get some tamiflu. She will call back if she starts to have symptoms before she hears from [place]. Fleeger, Maryjo Rochester, CMA

## 2016-02-19 ENCOUNTER — Other Ambulatory Visit: Payer: Self-pay | Admitting: Family Medicine

## 2016-02-19 MED ORDER — OSELTAMIVIR PHOSPHATE 75 MG PO CAPS
75.0000 mg | ORAL_CAPSULE | Freq: Two times a day (BID) | ORAL | 0 refills | Status: AC
Start: 2016-02-19 — End: 2016-02-24

## 2016-02-19 NOTE — Progress Notes (Signed)
Patient is aware of this script. Jazmin Hartsell,CMA

## 2016-02-23 NOTE — Telephone Encounter (Signed)
Rx was sent on 2/16 by Dr. Wonda Olds.  Fleeger, Maryjo Rochester, CMA

## 2016-03-05 ENCOUNTER — Other Ambulatory Visit: Payer: Self-pay | Admitting: Family Medicine

## 2016-03-07 ENCOUNTER — Ambulatory Visit (HOSPITAL_COMMUNITY): Payer: Medicaid Other | Attending: Cardiovascular Disease

## 2016-03-07 ENCOUNTER — Other Ambulatory Visit: Payer: Self-pay

## 2016-03-07 DIAGNOSIS — Z720 Tobacco use: Secondary | ICD-10-CM | POA: Insufficient documentation

## 2016-03-07 DIAGNOSIS — J449 Chronic obstructive pulmonary disease, unspecified: Secondary | ICD-10-CM | POA: Diagnosis not present

## 2016-03-07 DIAGNOSIS — E785 Hyperlipidemia, unspecified: Secondary | ICD-10-CM | POA: Insufficient documentation

## 2016-03-07 DIAGNOSIS — R079 Chest pain, unspecified: Secondary | ICD-10-CM | POA: Diagnosis not present

## 2016-03-07 DIAGNOSIS — I10 Essential (primary) hypertension: Secondary | ICD-10-CM | POA: Diagnosis not present

## 2016-03-29 ENCOUNTER — Other Ambulatory Visit: Payer: Self-pay | Admitting: Internal Medicine

## 2016-04-02 ENCOUNTER — Other Ambulatory Visit: Payer: Self-pay | Admitting: Family Medicine

## 2016-04-02 DIAGNOSIS — I10 Essential (primary) hypertension: Secondary | ICD-10-CM

## 2016-04-06 ENCOUNTER — Telehealth: Payer: Self-pay | Admitting: Internal Medicine

## 2016-04-06 NOTE — Telephone Encounter (Signed)
PA for the spiriva respimat has been approved x 1 year.  Then pharmacy did not have any problems running it through.

## 2016-04-18 ENCOUNTER — Ambulatory Visit: Payer: Medicaid Other | Admitting: Physician Assistant

## 2016-04-18 NOTE — Progress Notes (Deleted)
Cardiology Office Note    Date:  04/18/2016   ID:  Judith Scott, DOB [birth year], MRN [id number]  PCP:  Tillman Sers, DO  Cardiologist:  New  - (case discussed with Dr. Herbie Baltimore)  No chief complaint on file.   History of Present Illness:  Judith Scott is a 61 y.o. female  with PMH of HTN, HLD and COPD present to cardiology office for evaluation of dyspnea on exertion. She had PFT done in December 2015 that showed severe obstructive disease with possible restrictive disease, severe diffusion defect. She has been followed by Dr. Sandrea Hughs with pulmonology service. Last CT of the chest obtained in June 2017 does show mild coronary artery atherosclerosis. She was recently seen by family medicine on 02/09/2016 for dyspnea on exertion.   I saw the patient on 02/17/2016, she was complaining of dyspnea on exertion. She continued to smoke a pack and a half for the past 40 years. His markedly diminished breath sounds bilaterally with prolonged expiratory phase consistent with severe COPD. She has been using PRN 3 L nasal cannula at home. She only had one episode of chest pain, however no chest pain before or since then. He has no obvious exertional type of symptom. After discussing with Dr. Herbie Baltimore, we felt she is not a good candidate for Lexiscan stress test given his severe COPD. She would be unable to ambulate on the treadmill for long either. Echocardiogram obtained on 03/07/2016 showed EF 60-65%, no regional wall motion abnormalities. She presents today back for follow-up, if she still has chest discomfort, we can consider coronary CT with FFR.  No EKG  Past Medical History:  Diagnosis Date  . COPD (chronic obstructive pulmonary disease) (HCC)   . COPD exacerbation (HCC) 05/25/2012  . Hypercholesteremia   . Hypertension     Past Surgical History:  Procedure Laterality Date  . APPENDECTOMY      Current Medications: Outpatient Medications Prior to Visit  Medication Sig Dispense Refill  .  albuterol (PROVENTIL HFA;VENTOLIN HFA) 108 (90 BASE) MCG/ACT inhaler Inhale 2 puffs into the lungs every 4 (four) hours as needed for wheezing or shortness of breath. 1 Inhaler 3  . aspirin 81 MG chewable tablet Chew 81 mg by mouth daily.    Marland Kitchen atorvastatin (LIPITOR) 40 MG tablet TAKE 1 TABLET BY MOUTH DAILY 30 tablet 2  . Cholecalciferol (VITAMIN D PO) Take 1 tablet by mouth daily.    Marland Kitchen dextromethorphan-guaiFENesin (MUCINEX DM) 30-600 MG 12hr tablet Take 1 tablet by mouth 2 (two) times daily. Reported on 04/02/2015    . ipratropium-albuterol (DUONEB) 0.5-2.5 (3) MG/3ML SOLN USE 1 VIAL IN NEBULIZER EVERY 4 HOURS AS NEEDED 360 mL 0  . leflunomide (ARAVA) 20 MG tablet Take 20 mg by mouth daily.  2  . losartan (COZAAR) 50 MG tablet TAKE 1 TABLET BY MOUTH EVERY DAY 30 tablet 5  . OXYGEN 2lpm with sleep and as needed during the day    . predniSONE (DELTASONE) 10 MG tablet Take 1 tablet by mouth daily.  1  . SPIRIVA RESPIMAT 2.5 MCG/ACT AERS INHALE 2 PUFFS BY MOUTH EACH MORNING 1 Inhaler 10  . SYMBICORT 160-4.5 MCG/ACT inhaler INHALE 2 PUFFS INTO THE LUNGS 2 TIMES A DAY 10.2 Inhaler 5   No facility-administered medications prior to visit.      Allergies:   Patient has no known allergies.   Social History   Social History  . Marital status: Legally Separated    Spouse name: N/A  .  Number of children: N/A  . Years of education: N/A   Social History Main Topics  . Smoking status: Current Every Day Smoker    Packs/day: 1.50    Years: 40.00    Types: Cigarettes  . Smokeless tobacco: Never Used  . Alcohol use 0.0 oz/week     Comment: occasional  . Drug use: No  . Sexual activity: Yes    Birth control/ protection: Post-menopausal   Other Topics Concern  . Not on file   Social History Narrative   epworth sleepiness scale score: 2     Family History:  The patient's ***family history includes Lung cancer in her mother.   ROS:   Please see the history of present illness.    ROS All  other systems reviewed and are negative.   PHYSICAL EXAM:   VS:  There were no vitals taken for this visit.   GEN: Well nourished, well developed, in no acute distress HEENT: normal Neck: no JVD, carotid bruits, or masses Cardiac: ***RRR; no murmurs, rubs, or gallops,no edema  Respiratory:  clear to auscultation bilaterally, normal work of breathing GI: soft, nontender, nondistended, + BS MS: no deformity or atrophy Skin: warm and dry, no rash Neuro:  Alert and Oriented x 3, Strength and sensation are intact Psych: euthymic mood, full affect  Wt Readings from Last 3 Encounters:  02/17/16 147 lb (66.7 kg)  02/09/16 144 lb (65.3 kg)  09/02/15 146 lb (66.2 kg)      Studies/Labs Reviewed:   EKG:  EKG is*** ordered today.  The ekg ordered today demonstrates ***  Recent Labs: No results found for requested labs within last 8760 hours.   Lipid Panel    Component Value Date/Time   CHOL 186 09/02/2015 0955   TRIG 103 09/02/2015 0955   HDL 59 09/02/2015 0955   CHOLHDL 3.2 09/02/2015 0955   VLDL 21 09/02/2015 0955   LDLCALC 106 09/02/2015 0955    Additional studies/ records that were reviewed today include:   CT of chest 06/18/2015 IMPRESSION: 1. Ongoing stability of subpleural pulmonary nodules is consistent with a benign etiology, likely subpleural lymph nodes. 2. No suspicious or enlarging pulmonary nodule identified. 3. Age advanced coronary artery atherosclerosis. Recommend assessment of coronary risk factors and consideration of medical therapy. 4. Small pericardial effusion, similar.   Echo 03/07/2016 LV EF: 60% -   65%  - Left ventricle: The cavity size was normal. Systolic function was   normal. The estimated ejection fraction was in the range of 60%   to 65%. Wall motion was normal; there were no regional wall   motion abnormalities. Left ventricular diastolic function   parameters were normal. - Aortic valve: Transvalvular velocity was within the normal  range.   There was no stenosis. There was no regurgitation. - Mitral valve: Transvalvular velocity was within the normal range.   There was no evidence for stenosis. There was no regurgitation. - Right ventricle: The cavity size was normal. Wall thickness was   normal. Systolic function was normal. - Atrial septum: No defect or patent foramen ovale was identified   by color flow Doppler. - Tricuspid valve: There was trivial regurgitation.   ASSESSMENT:    No diagnosis found.   PLAN:  In order of problems listed above:  1. ***    Medication Adjustments/Labs and Tests Ordered: Current medicines are reviewed at length with the patient today.  Concerns regarding medicines are outlined above.  Medication changes, Labs and Tests ordered today are  listed in the Patient Instructions below. There are no Patient Instructions on file for this visit.   Ramond Dial, [place]  04/18/2016 7:07 AM    [facility] [address] [place], [place], [place]  [address] Phone: [phone]; Fax: [phone]

## 2016-05-04 ENCOUNTER — Encounter: Payer: Self-pay | Admitting: Family Medicine

## 2016-05-04 ENCOUNTER — Ambulatory Visit (INDEPENDENT_AMBULATORY_CARE_PROVIDER_SITE_OTHER): Payer: Medicaid Other | Admitting: Family Medicine

## 2016-05-04 VITALS — BP 118/70 | HR 87 | Temp 98.7°F | Wt 152.0 lb

## 2016-05-04 DIAGNOSIS — J449 Chronic obstructive pulmonary disease, unspecified: Secondary | ICD-10-CM | POA: Diagnosis present

## 2016-05-04 MED ORDER — ALBUTEROL SULFATE (2.5 MG/3ML) 0.083% IN NEBU: 2.5000 mg | INHALATION_SOLUTION | Freq: Four times a day (QID) | RESPIRATORY_TRACT | 12 refills | Status: DC | PRN

## 2016-05-04 NOTE — Progress Notes (Signed)
    Subjective:  Judith Scott is a 61 y.o. female who presents to the [facility] today with a chief complaint of shortness of breath.   HPI:  States has had increased SOB from baseline over the last couple of days. Attributes this to being more active, is currently helping a family member move into nursing home. Also was previously on chronic prednisone for rheumatoid arthritis and her rheumatologist recently weaned her off.   Patient has COPD on 3L at night and as needed during the day. Has been compliant with her home Spiriva and Symbicort, has not needed to use her rescue inhaler or go up on her oxygen. No fever/chills, no cough, no n/v, no weight loss.  ROS: Per HPI  Objective:  Physical Exam: BP 118/70   Pulse 87   Temp 98.7 F (37.1 C) (Oral)   Wt 152 lb (68.9 kg)   SpO2 93% Comment: 3L O2  BMI 26.09 kg/m   Gen: NAD, resting comfortably on her 3L O2 HEENT: TMs clear, oropharynx unremarkable. No cervical lymphadenopathy. CV: RRR with no murmurs appreciated Pulm: NWOB with nasal cannula in place, Diminished air movement with expiratory wheeze but no rhonchi. GI: Normal bowel sounds present. Soft, Nontender, Nondistended. MSK: no edema, cyanosis, or clubbing noted Skin: warm, dry Neuro: grossly normal, moves all extremities Psych: Normal affect and thought content   Assessment/Plan:  COPD GOLD III  Suspect combination of increased activity and being taken off chronic prednisone. Does not appear to be in acute exacerbation given no cough/fever and no worsening O2 requirement. Patient is compliant on her controller medications. - Continue controller medications. - Schedule use of rescue inhaler for the next 2 days. - Patient will call and schedule pulmonology follow up. - RTC if worsening   Leland Her, DO PGY-1, [facility] 05/04/2016 4:19 PM

## 2016-05-04 NOTE — Patient Instructions (Addendum)
It was good to meet you,  For your COPD, - Please use your rescue inhaler scheduled for the next couple days. - Make a follow up appointment with your pulmonologist.  If things get worse, please come back in to see [place] here.   Take care and seek immediate care sooner if you develop any concerns.   Dr. Leland Her, DO [facility]

## 2016-05-05 NOTE — Assessment & Plan Note (Signed)
Suspect combination of increased activity and being taken off chronic prednisone. Does not appear to be in acute exacerbation given no cough/fever and no worsening O2 requirement. Patient is compliant on her controller medications. - Continue controller medications. - Schedule use of rescue inhaler for the next 2 days. - Patient will call and schedule pulmonology follow up. - RTC if worsening

## 2016-05-17 ENCOUNTER — Encounter: Payer: Self-pay | Admitting: Family Medicine

## 2016-05-17 ENCOUNTER — Ambulatory Visit (INDEPENDENT_AMBULATORY_CARE_PROVIDER_SITE_OTHER): Payer: Medicaid Other | Admitting: Family Medicine

## 2016-05-17 VITALS — BP 122/68 | HR 94 | Temp 99.1°F | Ht 64.0 in | Wt 150.6 lb

## 2016-05-17 DIAGNOSIS — M5431 Sciatica, right side: Secondary | ICD-10-CM | POA: Diagnosis not present

## 2016-05-17 MED ORDER — GABAPENTIN 100 MG PO CAPS: 100.0000 mg | ORAL_CAPSULE | Freq: Three times a day (TID) | ORAL | 3 refills | Status: DC

## 2016-05-17 NOTE — Progress Notes (Signed)
    Subjective:    Patient ID: Judith Scott, female    DOB: 04-28-1955, 61 y.o.   MRN: [id number]   CC: right leg pain  HPI: states she has been having pain in her right hip and leg for the past 1-2 months that has worsened over the past week. It starts in her right hip and goes through her butt muscles down her leg to the top of her foot. Describes it as a sharp pain with what feels like a knot in her muscles. It is waking her up at night. Has tried goody powder with some partial relief. Certain positions make it worse like laying down. Denies numbness or tingling, no low back pain, no muscle weakness.   She is followed by rheumatology for rheumatoid arthritis and is on chronic prednisone. Does not think this pain is related to RA as it is not like her typical RA pain. Has seen rheumatology recently and is starting Embril injections soon.  Smoking status reviewed- current smoker 1 pdd   Review of Systems- see HPI   Objective:  BP 122/68 (BP Location: Left Arm, Patient Position: Sitting, Cuff Size: Normal)   Pulse 94   Temp 99.1 F (37.3 C) (Oral)   Ht 5\' 4"  (1.626 m)   Wt 150 lb 9.6 oz (68.3 kg)   SpO2 (!) 88% Comment: uses 3L/min O2 QHS  BMI 25.85 kg/m  Vitals and nursing note reviewed  General: well nourished, in no acute distress Cardiac: RRR, clear S1 and S2, no murmurs, rubs, or gallops Respiratory: decreased air movement throughout with mild wheezes bilaterally  MSK: tender to palpation of right piriformis. Normal active and passive ROM of hips bilaterally  Extremities: no edema or cyanosis. Skin: warm and dry, no rashes noted Neuro: alert and oriented, no focal deficits. 5/5 strength in LE bilaterally. L4 reflexes equal bilaterally   Assessment & Plan:    Sciatic nerve pain, right  Right leg pain most consistent with sciatic nerve pain.   -rx given for 100mg  gabapentin to take as needed TID can increase to 200-300 TID as needed -stretches to rehab piriformis muscle  reviewed with patient -advised to follow up if worsens     Return if symptoms worsen or fail to improve.   , DO Family Medicine Resident PGY-1

## 2016-05-17 NOTE — Assessment & Plan Note (Signed)
  Right leg pain most consistent with sciatic nerve pain.   -rx given for 100mg  gabapentin to take as needed TID can increase to 200-300 TID as needed -stretches to rehab piriformis muscle reviewed with patient -advised to follow up if worsens

## 2016-05-17 NOTE — Patient Instructions (Signed)
Sciatica Rehab Ask your health care provider which exercises are safe for you. Do exercises exactly as told by your health care provider and adjust them as directed. It is normal to feel mild stretching, pulling, tightness, or discomfort as you do these exercises, but you should stop right away if you feel sudden pain or your pain gets worse.Do not begin these exercises until told by your health care provider. Stretching and range of motion exercises These exercises warm up your muscles and joints and improve the movement and flexibility of your hips and your back. These exercises also help to relieve pain, numbness, and tingling. Exercise A: Sciatic nerve glide  1. Sit in a chair with your head facing down toward your chest. Place your hands behind your back. Let your shoulders slump forward. 2. Slowly straighten one of your knees while you tilt your head back as if you are looking toward the ceiling. Only straighten your leg as far as you can without making your symptoms worse. 3. Hold for __________ seconds. 4. Slowly return to the starting position. 5. Repeat with your other leg. Repeat __________ times. Complete this exercise __________ times a day. Exercise B: Knee to chest with hip adduction and internal rotation    1. Lie on your back on a firm surface with both legs straight. 2. Bend one of your knees and move it up toward your chest until you feel a gentle stretch in your lower back and buttock. Then, move your knee toward the shoulder that is on the opposite side from your leg. ? Hold your leg in this position by holding onto the front of your knee. 3. Hold for __________ seconds. 4. Slowly return to the starting position. 5. Repeat with your other leg. Repeat __________ times. Complete this exercise __________ times a day. Exercise C: Prone extension on elbows    1. Lie on your abdomen on a firm surface. A bed may be too soft for this exercise. 2. Prop yourself up on your  elbows. 3. Use your arms to help lift your chest up until you feel a gentle stretch in your abdomen and your lower back. ? This will place some of your body weight on your elbows. If this is uncomfortable, try stacking pillows under your chest. ? Your hips should stay down, against the surface that you are lying on. Keep your hip and back muscles relaxed. 4. Hold for __________ seconds. 5. Slowly relax your upper body and return to the starting position. Repeat __________ times. Complete this exercise __________ times a day. Strengthening exercises These exercises build strength and endurance in your back. Endurance is the ability to use your muscles for a long time, even after they get tired. Exercise D: Pelvic tilt  1. Lie on your back on a firm surface. Bend your knees and keep your feet flat. 2. Tense your abdominal muscles. Tip your pelvis up toward the ceiling and flatten your lower back into the floor. ? To help with this exercise, you may place a small towel under your lower back and try to push your back into the towel. 3. Hold for __________ seconds. 4. Let your muscles relax completely before you repeat this exercise. Repeat __________ times. Complete this exercise __________ times a day. Exercise E: Alternating arm and leg raises    1. Get on your hands and knees on a firm surface. If you are on a hard floor, you may want to use padding to cushion your knees, such as   an exercise mat. 2. Line up your arms and legs. Your hands should be below your shoulders, and your knees should be below your hips. 3. Lift your left leg behind you. At the same time, raise your right arm and straighten it in front of you. ? Do not lift your leg higher than your hip. ? Do not lift your arm higher than your shoulder. ? Keep your abdominal and back muscles tight. ? Keep your hips facing the ground. ? Do not arch your back. ? Keep your balance carefully, and do not hold your breath. 4. Hold for  __________ seconds. 5. Slowly return to the starting position and repeat with your right leg and your left arm. Repeat __________ times. Complete this exercise __________ times a day. Posture and body mechanics    Body mechanics refers to the movements and positions of your body while you do your daily activities. Posture is part of body mechanics. Good posture and healthy body mechanics can help to relieve stress in your body's tissues and joints. Good posture means that your spine is in its natural S-curve position (your spine is neutral), your shoulders are pulled back slightly, and your head is not tipped forward. The following are general guidelines for applying improved posture and body mechanics to your everyday activities. Standing     When standing, keep your spine neutral and your feet about hip-width apart. Keep a slight bend in your knees. Your ears, shoulders, and hips should line up.  When you do a task in which you stand in one place for a long time, place one foot up on a stable object that is 2-4 inches (5-10 cm) high, such as a footstool. This helps keep your spine neutral. Sitting     When sitting, keep your spine neutral and keep your feet flat on the floor. Use a footrest, if necessary, and keep your thighs parallel to the floor. Avoid rounding your shoulders, and avoid tilting your head forward.  When working at a desk or a computer, keep your desk at a height where your hands are slightly lower than your elbows. Slide your chair under your desk so you are close enough to maintain good posture.  When working at a computer, place your monitor at a height where you are looking straight ahead and you do not have to tilt your head forward or downward to look at the screen. Resting     When lying down and resting, avoid positions that are most painful for you.  If you have pain with activities such as sitting, bending, stooping, or squatting (flexion-based  activities), lie in a position in which your body does not bend very much. For example, avoid curling up on your side with your arms and knees near your chest (fetal position).  If you have pain with activities such as standing for a long time or reaching with your arms (extension-based activities), lie with your spine in a neutral position and bend your knees slightly. Try the following positions: ? Lying on your side with a pillow between your knees. ? Lying on your back with a pillow under your knees. Lifting     When lifting objects, keep your feet at least shoulder-width apart and tighten your abdominal muscles.  Bend your knees and hips and keep your spine neutral. It is important to lift using the strength of your legs, not your back. Do not lock your knees straight out.  Always ask for help to lift heavy   or awkward objects. This information is not intended to replace advice given to you by your health care provider. Make sure you discuss any questions you have with your health care provider. Document Released: 12/20/2004 Document Revised: 08/27/2015 Document Reviewed: 09/05/2014 Elsevier Interactive Patient Education  2017 [employer]          

## 2016-06-06 ENCOUNTER — Telehealth: Payer: Self-pay | Admitting: Family Medicine

## 2016-06-06 NOTE — Telephone Encounter (Signed)
  I do not see a medical reason for her to not do [employer] duty. At this time I'm erring on the side of declining this request. Could clinic staff please elicit the medical reason Ms. Judith Scott cannot attend jury duty? Thank you.

## 2016-06-06 NOTE — Telephone Encounter (Signed)
Pt  form dropped off for at front desk for completion.  Verified that patient section of form has been completed.  Last DOS/WCC with PCP was 05/17/16  Placed form in blue  team folder to be completed by clinical staff.  Judith Scott   Pt also needs a letter to go along with it stating that she is not able to participate in jury duty. ep

## 2016-06-06 NOTE — Telephone Encounter (Signed)
Patient states that she is currently on oxygen continuously and also her rheumatoid arthritis pain.  She states that with the weather her breathing is worse and has to use it all the time.  Imari Sivertsen,CMA

## 2016-06-06 NOTE — Telephone Encounter (Signed)
There is nothing to be completed by clinic staff. Pt just needs letter excusing her from [employer]. Placed in providers box for review.

## 2016-06-08 NOTE — Telephone Encounter (Signed)
  Called Ms. Judith Scott regarding request for letter excusing her from jury duty. She states her hand and wrist pain is too severe to attend jury duty. At this time no medical reason for not going to jury duty. She states she will get her rheumatologist will write the letter.   Form placed up front for patient to pick up.

## 2016-06-14 ENCOUNTER — Other Ambulatory Visit: Payer: Self-pay | Admitting: Family Medicine

## 2016-07-05 ENCOUNTER — Telehealth: Payer: Self-pay | Admitting: Internal Medicine

## 2016-07-05 NOTE — Telephone Encounter (Signed)
Payton Mccallum summons is in [employer].  Forwarding to LR to follow up on.

## 2016-07-05 NOTE — Telephone Encounter (Signed)
I am not actively following her so I can't legally do this - defer to PCP

## 2016-07-05 NOTE — Telephone Encounter (Signed)
Dr Sherene Sires do you want to do a jury excuse for her? She was last seen June 2017 and this is her AVS:  Suggested e-cigs as an optional  "one way bridge"  Off all tobacco products   No further CT scans needed at this point    If you are satisfied with your treatment plan,  let your doctor know and he/she can either refill your medications or you can return here when your prescription runs out.     If in any way you are not 100% satisfied,  please tell us.  If 100% better, tell your friends!  Pulmonary follow up is as needed        Instructions   Patient Instructions           After Visit Summary (Printed 06/29/2015)

## 2016-07-05 NOTE — Telephone Encounter (Signed)
Spoke with pt, aware of recs.   Summons left up front in brown accordion folder for pickup.  Nothing further needed.

## 2016-07-06 ENCOUNTER — Other Ambulatory Visit: Payer: Self-pay | Admitting: Internal Medicine

## 2016-07-08 ENCOUNTER — Telehealth: Payer: Self-pay | Admitting: Family Medicine

## 2016-07-08 ENCOUNTER — Other Ambulatory Visit: Payer: Self-pay | Admitting: Family Medicine

## 2016-07-08 ENCOUNTER — Telehealth: Payer: Self-pay | Admitting: Internal Medicine

## 2016-07-08 MED ORDER — BUDESONIDE-FORMOTEROL FUMARATE 160-4.5 MCG/ACT IN AERO
2.0000 | INHALATION_SPRAY | Freq: Two times a day (BID) | RESPIRATORY_TRACT | 0 refills | Status: DC
Start: 2016-07-08 — End: 2016-07-22

## 2016-07-08 NOTE — Telephone Encounter (Signed)
Pt contacted and informed of plan. Apt scheduled with PCP for 7/20.

## 2016-07-08 NOTE — Telephone Encounter (Signed)
Spoke with patient. She was asking why her Symbicort 160 RX was denied. Advised that it was denied due to her needing to call her PCP for refills. She verbalized understanding. Nothing further needed at time of call.

## 2016-07-08 NOTE — Telephone Encounter (Signed)
I am covering Dr. Purnell Shoemaker inbox, and it appears from the notes that pulmonology will no longer be refilling her Symbicort. I have refilled 1 inhaler of this for her, but she needs to schedule an appointment with Dr. Wonda Olds to discuss changes in her COPD treatment plans. Please call her and help her schedule this appointment.  Loni Muse, MD

## 2016-07-08 NOTE — Telephone Encounter (Signed)
Pt was told by her pulmonologist  she needed to get her refills for Symbicort thru her PCP.  She uses [facility] on [address].

## 2016-07-12 ENCOUNTER — Ambulatory Visit: Payer: Medicaid Other | Admitting: Internal Medicine

## 2016-07-22 ENCOUNTER — Encounter: Payer: Self-pay | Admitting: Family Medicine

## 2016-07-22 ENCOUNTER — Ambulatory Visit (INDEPENDENT_AMBULATORY_CARE_PROVIDER_SITE_OTHER): Payer: Medicaid Other | Admitting: Family Medicine

## 2016-07-22 VITALS — BP 152/78 | HR 96 | Temp 98.2°F | Wt 152.0 lb

## 2016-07-22 DIAGNOSIS — Z1231 Encounter for screening mammogram for malignant neoplasm of breast: Secondary | ICD-10-CM

## 2016-07-22 DIAGNOSIS — J449 Chronic obstructive pulmonary disease, unspecified: Secondary | ICD-10-CM | POA: Diagnosis present

## 2016-07-22 DIAGNOSIS — M5431 Sciatica, right side: Secondary | ICD-10-CM

## 2016-07-22 DIAGNOSIS — Z0000 Encounter for general adult medical examination without abnormal findings: Secondary | ICD-10-CM | POA: Insufficient documentation

## 2016-07-22 DIAGNOSIS — E785 Hyperlipidemia, unspecified: Secondary | ICD-10-CM | POA: Diagnosis not present

## 2016-07-22 DIAGNOSIS — Z1159 Encounter for screening for other viral diseases: Secondary | ICD-10-CM

## 2016-07-22 DIAGNOSIS — Z1239 Encounter for other screening for malignant neoplasm of breast: Secondary | ICD-10-CM

## 2016-07-22 DIAGNOSIS — Z720 Tobacco use: Secondary | ICD-10-CM

## 2016-07-22 MED ORDER — GABAPENTIN 100 MG PO CAPS: 100.0000 mg | ORAL_CAPSULE | Freq: Three times a day (TID) | ORAL | 3 refills | Status: DC

## 2016-07-22 MED ORDER — BUDESONIDE-FORMOTEROL FUMARATE 160-4.5 MCG/ACT IN AERO
2.0000 | INHALATION_SPRAY | Freq: Two times a day (BID) | RESPIRATORY_TRACT | 5 refills | Status: DC
Start: 2016-07-22 — End: 2016-12-31

## 2016-07-22 NOTE — Assessment & Plan Note (Signed)
  Due for Hep C, HIV, mammogram, colonoscopy  -check Hep C and HIV today -order for mammogram placed and information given to patient -per patient due for colonoscopy in 1 year, asked her to check on this and confirm

## 2016-07-22 NOTE — Assessment & Plan Note (Addendum)
  Smokes 1 pack per day, trying to use nicotine patch to quit but not making much headway. Declines pharm clinic visit or medication assistance to help quitting.  -continue encouraging cessation

## 2016-07-22 NOTE — Assessment & Plan Note (Signed)
  Chronic, on 3L O2 at home. Continues to smoke  -refilled symbicort inhaler -follow up with pulmonologist as scheduled -follow up here as needed if develops fevers, chills, worsening SOB

## 2016-07-22 NOTE — Patient Instructions (Signed)
   It was great seeing you today!  I'll call you with your lab results.   Please get your mammogram done and check to see when you are due again for a colonoscopy.   If you have questions or concerns please do not hesitate to call at [phone].  Dolores Patty, DO PGY-2, [facility] 07/22/2016 10:09 AM

## 2016-07-22 NOTE — Assessment & Plan Note (Addendum)
  Persistent but improved with stretching and neurontin  -Refilled gabapentin -continue piriformis stretching

## 2016-07-22 NOTE — Assessment & Plan Note (Signed)
  Taking atorva 40 mg daily, last lipid panel August 2017  -recheck lipid panel fasting today -continue statin -will call patient with results

## 2016-07-22 NOTE — Progress Notes (Signed)
    Subjective:    Patient ID: Judith Scott, female    DOB: 07/23/1955, 61 y.o.   MRN: [id number]   CC: inhaler refill  COPD- doing ok overall, follows with pulmonology Dr Sherene Sires.  She is using 3L O2 at night and as needed throughout day Needs refill on symbicort inhaler Using spiriva daily as directed Using albuterol as needed, more so this past week due to heat She coughs up phlegm at her baseline, no fevers or chills, no excessive SOB  Tobacco abuse: Smoking status reviewed- current every day smoker 1 ppd Patient reports she is trying to quit on her own Using 21 mg nicotine patch daily which curbs cravings for 4-5 hours She does not want to meet with Dr Raymondo Band to discuss smoking cessation She is not interested in using medication to help her quit at this time  Sciatic nerve pain on right: Persistent but improving, doing stretches with some relief She is using gabapentin as needed with good relief, 30 pills lasts her a long time Denies weakness in her legs  Health maintenance: Due for HIV, Hep C screening and willing to get these today Due for mammogram, she states she tried to get this done but the place said she needed an order She is due for colonoscopy next year she believes, has had one in past  Review of Systems- no CP, weakness in extremities, weight loss   Objective:  BP (!) 152/78   Pulse 96   Temp 98.2 F (36.8 C) (Oral)   Wt 152 lb (68.9 kg)   SpO2 (!) 85% Comment: left oxygen in the car.  BMI 26.09 kg/m  Vitals and nursing note reviewed  General: elderly lady, well nourished, in no acute distress Cardiac: RRR, clear S1 and S2, no murmurs, rubs, or gallops Respiratory: end expiratory wheezes ausucltated in all lung fields, no increased work of breathing Extremities: no edema or cyanosis. Neuro: alert and oriented, no focal deficits  Assessment & Plan:    Sciatic nerve pain, right  Persistent but improved with stretching and neurontin  -Refilled  gabapentin -continue piriformis stretching  Tobacco abuse  Smokes 1 pack per day, trying to use nicotine patch to quit but not making much headway. Declines pharm clinic visit or medication assistance to help quitting.  -continue encouraging cessation  COPD GOLD III   Chronic, on 3L O2 at home. Continues to smoke  -refilled symbicort inhaler -follow up with pulmonologist as scheduled -follow up here as needed if develops fevers, chills, worsening SOB  Hyperlipidemia  Taking atorva 40 mg daily, last lipid panel August 2017  -recheck lipid panel fasting today -continue statin -will call patient with results  Healthcare maintenance  Due for Hep C, HIV, mammogram, colonoscopy  -check Hep C and HIV today -order for mammogram placed and information given to patient -per patient due for colonoscopy in 1 year, asked her to check on this and confirm    Return in about 6 months (around 01/22/2017), or as needed.   Dolores Patty, DO Family Medicine Resident PGY-2

## 2016-07-23 LAB — LIPID PANEL
CHOLESTEROL TOTAL: 210 mg/dL — AB (ref 100–199)
Chol/HDL Ratio: 3.1 ratio (ref 0.0–4.4)
HDL: 68 mg/dL (ref >39)
LDL Calculated: 110 mg/dL — ABNORMAL HIGH (ref 0–99)
Triglycerides: 158 mg/dL — ABNORMAL HIGH (ref 0–149)
VLDL Cholesterol Cal: 32 mg/dL (ref 5–40)

## 2016-07-23 LAB — HEPATITIS C ANTIBODY

## 2016-07-23 LAB — HIV ANTIBODY (ROUTINE TESTING W REFLEX): HIV SCREEN 4TH GENERATION: NONREACTIVE

## 2016-07-26 ENCOUNTER — Other Ambulatory Visit: Payer: Self-pay | Admitting: *Deleted

## 2016-07-26 NOTE — Telephone Encounter (Signed)
Patient will need a new script to reflect her change in medication.  Will forward to MD.  Patient voiced understanding on lab results and repeated back new medication dosing. Jannifer Fischler,CMA

## 2016-07-26 NOTE — Telephone Encounter (Signed)
-----   Message from Tillman Sers, DO sent at 07/26/2016  4:36 PM EDT -----  Please call Ms. Leven to let her know her HIV and Hep C screening tests were negative. Her lipid panel shows her cholesterol is still a littler higher than I'd like, especially with her smoking as well. Please ask her to take 2 lipitor pills a day (80 mg instead of 40mg  daily). I'll see her back in 6 months to recheck lipid panel and follow up on other problems. Thanks.

## 2016-07-27 MED ORDER — ATORVASTATIN CALCIUM 80 MG PO TABS: 80.0000 mg | ORAL_TABLET | Freq: Every day | ORAL | 3 refills | Status: DC

## 2016-08-05 ENCOUNTER — Ambulatory Visit (INDEPENDENT_AMBULATORY_CARE_PROVIDER_SITE_OTHER): Payer: Medicaid Other | Admitting: Internal Medicine

## 2016-08-05 ENCOUNTER — Encounter: Payer: Self-pay | Admitting: Internal Medicine

## 2016-08-05 ENCOUNTER — Ambulatory Visit (INDEPENDENT_AMBULATORY_CARE_PROVIDER_SITE_OTHER)
Admission: RE | Admit: 2016-08-05 | Discharge: 2016-08-05 | Disposition: A | Discharge status: Home / Self Care | Payer: Medicaid Other | Source: Ambulatory Visit | Attending: Internal Medicine | Admitting: Internal Medicine

## 2016-08-05 VITALS — BP 128/84 | HR 82 | Ht 64.0 in | Wt 153.0 lb

## 2016-08-05 DIAGNOSIS — J449 Chronic obstructive pulmonary disease, unspecified: Secondary | ICD-10-CM

## 2016-08-05 DIAGNOSIS — J9611 Chronic respiratory failure with hypoxia: Secondary | ICD-10-CM | POA: Diagnosis not present

## 2016-08-05 DIAGNOSIS — F17210 Nicotine dependence, cigarettes, uncomplicated: Secondary | ICD-10-CM | POA: Diagnosis not present

## 2016-08-05 NOTE — Patient Instructions (Addendum)
No change on medications  The key is to stop smoking completely before smoking completely stops you!   Please remember to go to the  x-ray department downstairs in the basement  for your tests - we will call you with the results when they are available.       If you are satisfied with your treatment plan,  let your doctor know and he/she can either refill your medications or you can return here when your prescription runs out.     If in any way you are not 100% satisfied,  please tell us.  If 100% better, tell your friends!  Pulmonary follow up is as needed

## 2016-08-05 NOTE — Progress Notes (Signed)
Subjective:    Patient ID: Judith Scott, female    DOB: 1955-05-16  MRN: [id number]    Brief patient profile:  44 yowf  Active smoker dx with copd around 2010 using albuterol neb around twice daily but good activity tolerance then 2014 started on spriva then  x 2 weeks gradually worse > admit  Admit date: 10/13/2013  Discharge date: 10/15/2013    Discharge Diagnoses:  Acute respiratory failure with hypoxia secondary to COPD exacerbation  COPD exacerbation secondary to continued tobacco abuse which is by radiology  Tobacco abuse  Nodular opacity found on x-ray  Hyponatremia and hypochloremia  Hypertension  Hyperlipidemia       10/23/2013 1st [facility] office visit/ Herbert Aguinaldo / post hosp f/u 02 3lpm and no longer smoking  Chief Complaint  Patient presents with  . Pulmonary Consult    Referred by Dr. Luna Glasgow. Pt states dxed with COPD approx 5 yrs ago. She was had recent exacerbation and was admitted to hospital from 10/13/13 to 10/15/13. She states that her breathing is back to her normal baseline. She has not had to use her rescue inhaler and uses her albuterol neb 4 x per day.   back to baseline activity tol but 02 dep  Cough is clear/ slt yellow worse in ams Confused with how/ when to use saba with poor hfa and dpi so neb dependent  >rx symbicort and spiriva    11/06/2013 Follow up COPD/Chronic O2 depend RF /Lung nodule  Returns for follow up .   Advised per MW to stop Lisinopril, PCP told patient to remain on it but at lower dose - 5mg . Using O2 at night and PRN daytime.  Seen last ov for post hospital follow up .  Started on Symbicort . Remains on Spiriva  Needs PFTs Feels her breathing is better since discharge.  No flare in cough or wheezing  Gets winded with walking incline or long distance.  rec Continue to wear Oxygen 3 l/m with long distance walking and At bedtime  .  Return in 6 weeks with Dr.  And PFT .  Continue on working to quit smoking -most important  goal .  Discuss with family doctor regarding change your Lisinopril to alternative to due to cough.  Continue on Symbicort and Spiriva .  Rinse after all inhaler use.      12/18/2013 f/u ov/Parissa Chiao re: GOLD III / still smoking  Chief Complaint  Patient presents with  . Follow-up    PFT done today. She is using proair 3-4 x per wk, but has not needed neb. No new co's today.   once improved after aecopd resumed smoking. "just my usual bronchitis cough" worse in ams min white to beige mucus, clears up p first 30 min  Doe x > 100 yards, or if walks fast or does any inclines  rec The key is to stop smoking completely before smoking completely stops you!  Work on inhaler technique:       03/18/2014 f/u ov/Lashunta Frieden re: copd GOLD III still smoking on symb/spiriva no neb saba any longer  Chief Complaint  Patient presents with  . Follow-up    CXR done today. Pt states that her breathing is overall doing well. She is using rescue inhaler 1 x per wk on average.    [place], cutting back on smoking but living in a trailer with Husband Smoking  rec The key is to stop smoking completely before smoking completely stops  you!  We will see you in June after your scan   06/24/2014 f/u ov/Deontay Ladnier re: spns/ quit smoking x 2 weeks / spiriva/ symbicort / GOLD III  Copd  Chief Complaint  Patient presents with  . Follow-up    Pt states her breathing is unchanged. She c/o increased wheezing and cough for the past few days. Cough is prod with min clear sputum. She is using rescue inhaler 2-3 x per day and has not needed to use neb.   not needing neb/ walking flat nl pace no problem   rec No change rx    03/19/2015  f/u ov/Portia Wisdom re: GOLD III copd/ still smoking 3lpm hs and walkng   Chief Complaint  Patient presents with  . Follow-up    Pt c/o continued cough with yellow mucus and SOB. Pt denies wheeze/CP/tightness. Pt uses 3L O2 when ambulating.   doe on 02 = MMRC1 = can walk nl pace, flat  grade, can't hurry or go uphills or steps s sob   Cough worse in am since restarted smoking / rare need for saba  rec No change in your medications or 02 at this point  The key is to stop smoking completely before smoking completely stops you!    06/29/2015  f/u ov/Kannon Granderson re:  GOLD III still smoking/ symb/spiriva/ 02 3 lpm  Chief Complaint  Patient presents with  . Follow-up    Breathing is at normal baseline for her. She uses albuterol inhaler 1 to 2 times per wk on average and rarely uses neb.   doe continues mmrc=1 Less am congestion despite still smoking  rec Suggested e-cigs as an optional  "one way bridge"  Off all tobacco products  No further CT scans needed at this point   If you are satisfied with your treatment plan,  let your doctor know and he/she can either refill your medications or you can return here when your prescription runs out.    If in any way you are not 100% satisfied,  please tell us.  If 100% better, tell your friends! Pulmonary follow up is as needed       08/05/2016  f/u ov/Banner Huckaba re:  GOLD III/ still smoking / symb / spiriva  02 3lpm Chief Complaint  Patient presents with  . Follow-up    Breathing is doing well overall. She rarely uses her albuterol inhaler or neb.    doe = MMRC1 = can walk nl pace, flat grade, can't hurry or go uphills or steps s sob   Congested cough worse in ams mucoid only   No obvious day to day or daytime variability or assoc  purulent sputum or mucus plugs or hemoptysis or cp or chest tightness, subjective wheeze or overt sinus or hb symptoms. No unusual exp hx or h/o childhood pna/ asthma or knowledge of premature birth.  Sleeping ok without nocturnal  or early am exacerbation  of respiratory  c/o's or need for noct saba. Also denies any obvious fluctuation of symptoms with weather or environmental changes or other aggravating or alleviating factors except as outlined above   Current Medications, Allergies, Complete Past Medical  History, Past Surgical History, Family History, and Social History were reviewed in [employer] record.  ROS  The following are not active complaints unless bolded sore throat, dysphagia, dental problems, itching, sneezing,  nasal congestion or excess/ purulent secretions, ear ache,   fever, chills, sweats, unintended wt loss, classically pleuritic or exertional cp,  orthopnea pnd  or leg swelling, presyncope, palpitations, abdominal pain, anorexia, nausea, vomiting, diarrhea  or change in bowel or bladder habits, change in stools or urine, dysuria,hematuria,  rash, arthralgias, visual complaints, headache, numbness, weakness or ataxia or problems with walking or coordination,  change in mood/affect or memory.                Objective:   Physical Exam  amb wf  rattling congested sounding cough   06/24/2014       142  > 03/19/2015  144 > 06/29/2015  145 > 08/05/2016  153     03/18/14 137 lb (62.143 kg)  02/05/14 135 lb (61.236 kg)  12/18/13 137 lb (62.143 kg)    Vital signs reviewed  - Note on arrival 02 sats  93% on RA rest    HEENT: nl dentition, turbinates bilaterally, and oropharynx. Nl external ear canals without cough reflex   NECK :  without JVD/Nodes/TM/ nl carotid upstrokes bilaterally   LUNGS: no acc muscle use,  Nl contour chest with minimal insp and exp rhonchi bilaterally    CV:  RRR  no s3 or murmur or increase in P2, and no edema   ABD:  soft and nontender with nl inspiratory excursion in the supine position. No bruits or organomegaly appreciated, bowel sounds nl  MS:  Nl gait/ ext warm without deformities, calf tenderness, cyanosis or clubbing No obvious joint restrictions   SKIN: warm and dry without lesions    NEURO:  alert, approp, nl sensorium with  no motor or cerebellar deficits apparent.                CXR PA and Lateral:   08/05/2016 :    I personally reviewed images and agree with radiology impression as follows:    No  active cardiopulmonary disease        Assessment & Plan:

## 2016-08-05 NOTE — Progress Notes (Signed)
Spoke with pt and notified of results per Dr. Wert. Pt verbalized understanding and denied any questions. 

## 2016-08-07 NOTE — Assessment & Plan Note (Signed)

## 2016-08-07 NOTE — Assessment & Plan Note (Addendum)
10/23/2013   try symbicort 160 2bid/spiriva and prn saba 12/17/13 PFTs  FEV1  0.79 (30%) ratio 50 p no sign change from saba and DLOC 38%   - 06/24/2014 p extensive coaching HFA effectiveness =    90% from a baseline 75%   - changed to prn  08/05/2016  - 08/05/2016  After extensive coaching HFA effectiveness =    90%   Despite active smoking relatively well compensated on present rx   I had an extended discussion with the patient reviewing all relevant studies completed to date and  lasting 15 to 20 minutes of a 25 minute visit    Each maintenance medication was reviewed in detail including most importantly the difference between maintenance and prns and under what circumstances the prns are to be triggered using an action plan format that is not reflected in the computer generated alphabetically organized AVS.    Please see AVS for specific instructions unique to this visit that I personally wrote and verbalized to the the pt in detail and then reviewed with pt  by my nurse highlighting any  changes in therapy recommended at today's visit to their plan of care.

## 2016-08-07 NOTE — Assessment & Plan Note (Signed)
-   d/c on 3lpm 10/15/13 - 10/23/2013   Walked RA x one lap @ 185 stopped due to  desat to 87%  With sob at a nl pace at end of walk  As of 08/07/2016 rec 02 3lpm at hs and walking outside her house but not needed at rest

## 2016-08-19 ENCOUNTER — Other Ambulatory Visit: Payer: Self-pay | Admitting: Family Medicine

## 2016-08-19 ENCOUNTER — Telehealth: Payer: Self-pay | Admitting: Family Medicine

## 2016-08-19 MED ORDER — ATORVASTATIN CALCIUM 80 MG PO TABS: 80.0000 mg | ORAL_TABLET | Freq: Every day | ORAL | 3 refills | Status: DC

## 2016-08-19 NOTE — Telephone Encounter (Signed)
Refilled lipitor at increased dose to preferred pharmacy.

## 2016-08-19 NOTE — Telephone Encounter (Signed)
Pt states she was told to double on Lipitor and is almost out. Pharmacy said pt is not due for another refill. Pt uses [facility] [address] ep

## 2016-10-04 ENCOUNTER — Other Ambulatory Visit: Payer: Self-pay | Admitting: Internal Medicine

## 2016-10-10 ENCOUNTER — Other Ambulatory Visit: Payer: Self-pay | Admitting: Family Medicine

## 2016-10-10 DIAGNOSIS — I10 Essential (primary) hypertension: Secondary | ICD-10-CM

## 2016-10-31 ENCOUNTER — Other Ambulatory Visit: Payer: Self-pay | Admitting: Family Medicine

## 2016-10-31 DIAGNOSIS — Z1231 Encounter for screening mammogram for malignant neoplasm of breast: Secondary | ICD-10-CM

## 2016-11-03 ENCOUNTER — Ambulatory Visit (INDEPENDENT_AMBULATORY_CARE_PROVIDER_SITE_OTHER): Payer: Medicaid Other | Admitting: *Deleted

## 2016-11-03 DIAGNOSIS — Z23 Encounter for immunization: Secondary | ICD-10-CM

## 2016-11-22 ENCOUNTER — Ambulatory Visit
Admission: RE | Admit: 2016-11-22 | Discharge: 2016-11-22 | Disposition: A | Discharge status: Home / Self Care | Payer: Medicaid Other | Source: Ambulatory Visit | Attending: Family Medicine | Admitting: Family Medicine

## 2016-11-22 DIAGNOSIS — Z1231 Encounter for screening mammogram for malignant neoplasm of breast: Secondary | ICD-10-CM

## 2016-12-21 ENCOUNTER — Other Ambulatory Visit: Payer: Self-pay

## 2016-12-21 ENCOUNTER — Encounter: Payer: Self-pay | Admitting: Internal Medicine

## 2016-12-21 ENCOUNTER — Ambulatory Visit: Payer: Medicaid Other | Admitting: Internal Medicine

## 2016-12-21 VITALS — BP 122/61 | HR 100 | Temp 97.9°F | Wt 146.0 lb

## 2016-12-21 DIAGNOSIS — J441 Chronic obstructive pulmonary disease with (acute) exacerbation: Secondary | ICD-10-CM

## 2016-12-21 MED ORDER — AMOXICILLIN-POT CLAVULANATE 875-125 MG PO TABS: 1.0000 | ORAL_TABLET | Freq: Two times a day (BID) | ORAL | 0 refills | Status: DC

## 2016-12-21 MED ORDER — PREDNISONE 50 MG PO TABS: ORAL_TABLET | ORAL | 0 refills | Status: DC

## 2016-12-21 MED ORDER — BENZONATATE 100 MG PO CAPS: 100.0000 mg | ORAL_CAPSULE | Freq: Two times a day (BID) | ORAL | 0 refills | Status: DC | PRN

## 2016-12-21 NOTE — Progress Notes (Signed)
[facility] Progress Note  Subjective:  Judith Scott is a 61 y.o. female with history of COPD on 3L of O2 chronically, RA on prednisone and etanercept, and tobacco abuse who presents for cold symptoms. She reports cough and congestion for a couple days. She has increased sputum production and cough. Has not noted increased SOB but needed to use her albuterol last night for the first time in many months; usually her COPD is well controlled on her maintenance symbicort and spiriva inhalers. She takes 5 mg prednisone daily for RA and does etanercept injections weekly. She says she has not had a COPD flare in a "couple years" and did not take antibiotics as prescribed at that time. She has trouble sleeping due to cough. She also reports reduced appetite. She says her aunt had a COPD flare recently and that her 63-year-old granddaughter had a cold.   Tobacco abuse: Not ready to quit.   No Known Allergies  Social History   Tobacco Use  . Smoking status: Current Every Day Smoker    Packs/day: 1.50    Years: 40.00    Pack years: 60.00    Types: Cigarettes  . Smokeless tobacco: Never Used  Substance Use Topics  . Alcohol use: Yes    Alcohol/week: 0.0 oz    Comment: occasional    Objective: Blood pressure 122/61, pulse 100, temperature 97.9 F (36.6 C), temperature source Oral, weight 146 lb (66.2 kg), SpO2 91 %. Body mass index is 25.06 kg/m.  Constitutional: Appears older than stated age, nasal canula in place HENT: MMM, NCAT, minimal nasal congestion, mild erythema of posterior oropharynx. Opacification of R TM and small amount of fluid behind L TM.  Cardiovascular: RRR, S1, S2, no m/r/g.  Pulmonary/Chest: Decreased breath sounds with expiratory wheezing at bases and over anterior lung fields . Skin: Skin is warm and dry. No rash noted.  Psychiatric: Normal mood and affect.  Vitals reviewed  Assessment/Plan: COPD exacerbation (HCC) - Increased sputum production and  cough and need for use of rescue inhaler. Do not suspect pneumonia due to lack of fever and no worsening of chronic hypoxia. Could also have URI. - Prescribed 50 mg prednisone x 5 days and augmentin BID x 7 days for COPD exacerbation. - Recommended mucinex and tessalon perles for cough. - Counseled on return precautions for respiratory distress.   Follow-up if no improvement.  Dani Gobble, MD [facility], PGY-3

## 2016-12-21 NOTE — Patient Instructions (Signed)
Ms. Alcaide,  Thank you for coming in.  To treat this COPD exacerbation, I prescribed augmentin to take twice daily for the next week. Also take prednisone 50 mg for the next 5 days.  Please seek emergency care if you cannot maintain your oxygen saturations in the higher 80s or have worsening shortness of breath.  Best, Dr. Sampson Goon

## 2016-12-23 ENCOUNTER — Encounter: Payer: Self-pay | Admitting: Internal Medicine

## 2016-12-23 NOTE — Assessment & Plan Note (Signed)
-   Increased sputum production and cough and need for use of rescue inhaler. Do not suspect pneumonia due to lack of fever and no worsening of chronic hypoxia. Could also have URI. - Prescribed 50 mg prednisone x 5 days and augmentin BID x 7 days for COPD exacerbation. - Recommended mucinex and tessalon perles for cough. - Counseled on return precautions for respiratory distress.

## 2016-12-31 ENCOUNTER — Other Ambulatory Visit: Payer: Self-pay | Admitting: Family Medicine

## 2017-01-05 NOTE — Telephone Encounter (Signed)
Needs refill on symbicort. She is completely out and really needs it

## 2017-01-10 ENCOUNTER — Emergency Department (HOSPITAL_COMMUNITY): Payer: Medicaid Other

## 2017-01-10 ENCOUNTER — Ambulatory Visit: Payer: Medicaid Other | Admitting: Family Medicine

## 2017-01-10 ENCOUNTER — Other Ambulatory Visit: Payer: Self-pay

## 2017-01-10 ENCOUNTER — Encounter (HOSPITAL_COMMUNITY): Payer: Self-pay | Admitting: Nurse Practitioner

## 2017-01-10 ENCOUNTER — Inpatient Hospital Stay (HOSPITAL_COMMUNITY)
Admission: EM | Admit: 2017-01-10 | Discharge: 2017-01-13 | DRG: 191 | Disposition: A | Discharge status: Home / Self Care | Payer: Medicaid Other | Attending: Internal Medicine | Admitting: Internal Medicine

## 2017-01-10 DIAGNOSIS — Z7951 Long term (current) use of inhaled steroids: Secondary | ICD-10-CM

## 2017-01-10 DIAGNOSIS — I10 Essential (primary) hypertension: Secondary | ICD-10-CM | POA: Diagnosis present

## 2017-01-10 DIAGNOSIS — J209 Acute bronchitis, unspecified: Secondary | ICD-10-CM

## 2017-01-10 DIAGNOSIS — J441 Chronic obstructive pulmonary disease with (acute) exacerbation: Principal | ICD-10-CM | POA: Diagnosis present

## 2017-01-10 DIAGNOSIS — R52 Pain, unspecified: Secondary | ICD-10-CM

## 2017-01-10 DIAGNOSIS — Z7952 Long term (current) use of systemic steroids: Secondary | ICD-10-CM

## 2017-01-10 DIAGNOSIS — Z9981 Dependence on supplemental oxygen: Secondary | ICD-10-CM

## 2017-01-10 DIAGNOSIS — J9611 Chronic respiratory failure with hypoxia: Secondary | ICD-10-CM | POA: Diagnosis not present

## 2017-01-10 DIAGNOSIS — E785 Hyperlipidemia, unspecified: Secondary | ICD-10-CM | POA: Diagnosis present

## 2017-01-10 DIAGNOSIS — F17210 Nicotine dependence, cigarettes, uncomplicated: Secondary | ICD-10-CM | POA: Diagnosis present

## 2017-01-10 DIAGNOSIS — E7800 Pure hypercholesterolemia, unspecified: Secondary | ICD-10-CM | POA: Diagnosis present

## 2017-01-10 LAB — CBC WITH DIFFERENTIAL/PLATELET
BASOS ABS: 0 10*3/uL (ref 0.0–0.1)
BASOS PCT: 0 %
Eosinophils Absolute: 0 10*3/uL (ref 0.0–0.7)
Eosinophils Relative: 0 %
HEMATOCRIT: 43.3 % (ref 36.0–46.0)
HEMOGLOBIN: 13.3 g/dL (ref 12.0–15.0)
Lymphocytes Relative: 16 %
Lymphs Abs: 1.4 10*3/uL (ref 0.7–4.0)
MCH: 30.7 pg (ref 26.0–34.0)
MCHC: 30.7 g/dL (ref 30.0–36.0)
MCV: 100 fL (ref 78.0–100.0)
MONO ABS: 1 10*3/uL (ref 0.1–1.0)
Monocytes Relative: 12 %
NEUTROS ABS: 5.8 10*3/uL (ref 1.7–7.7)
NEUTROS PCT: 72 %
Platelets: 346 10*3/uL (ref 150–400)
RBC: 4.33 MIL/uL (ref 3.87–5.11)
RDW: 13.3 % (ref 11.5–15.5)
WBC: 8.2 10*3/uL (ref 4.0–10.5)

## 2017-01-10 LAB — BASIC METABOLIC PANEL
ANION GAP: 8 (ref 5–15)
BUN: <5 mg/dL — ABNORMAL LOW (ref 6–20)
CALCIUM: 8.9 mg/dL (ref 8.9–10.3)
CO2: 38 mmol/L — AB (ref 22–32)
Chloride: 93 mmol/L — ABNORMAL LOW (ref 101–111)
Creatinine, Ser: 0.33 mg/dL — ABNORMAL LOW (ref 0.44–1.00)
GFR calc non Af Amer: >60 mL/min (ref >60)
Glucose, Bld: 110 mg/dL — ABNORMAL HIGH (ref 65–99)
Potassium: 4.1 mmol/L (ref 3.5–5.1)
Sodium: 139 mmol/L (ref 135–145)

## 2017-01-10 LAB — I-STAT TROPONIN, ED: TROPONIN I, POC: 0.05 ng/mL (ref 0.00–0.08)

## 2017-01-10 LAB — INFLUENZA PANEL BY PCR (TYPE A & B)
Influenza A By PCR: NEGATIVE
Influenza B By PCR: NEGATIVE

## 2017-01-10 LAB — BRAIN NATRIURETIC PEPTIDE: B Natriuretic Peptide: 145.4 pg/mL — ABNORMAL HIGH (ref 0.0–100.0)

## 2017-01-10 MED ORDER — DOXYCYCLINE HYCLATE 100 MG PO TABS
100.0000 mg | ORAL_TABLET | Freq: Once | ORAL | Status: AC
  Administered 2017-01-10: 100 mg via ORAL
  Filled 2017-01-10: qty 1

## 2017-01-10 MED ORDER — MOMETASONE FURO-FORMOTEROL FUM 200-5 MCG/ACT IN AERO
2.0000 | INHALATION_SPRAY | Freq: Two times a day (BID) | RESPIRATORY_TRACT | Status: DC
  Administered 2017-01-10 – 2017-01-11 (×2): 2 via RESPIRATORY_TRACT
  Filled 2017-01-10: qty 8.8

## 2017-01-10 MED ORDER — DOXYCYCLINE HYCLATE 100 MG PO TABS
100.0000 mg | ORAL_TABLET | Freq: Two times a day (BID) | ORAL | Status: DC
  Administered 2017-01-11 – 2017-01-13 (×5): 100 mg via ORAL
  Filled 2017-01-10 (×5): qty 1

## 2017-01-10 MED ORDER — ONDANSETRON HCL 4 MG/2ML IJ SOLN: 4.0000 mg | Freq: Four times a day (QID) | INTRAMUSCULAR | Status: DC | PRN

## 2017-01-10 MED ORDER — METHYLPREDNISOLONE SODIUM SUCC 125 MG IJ SOLR
125.0000 mg | Freq: Once | INTRAMUSCULAR | Status: AC
  Administered 2017-01-10: 125 mg via INTRAVENOUS
  Filled 2017-01-10: qty 2

## 2017-01-10 MED ORDER — GABAPENTIN 100 MG PO CAPS
100.0000 mg | ORAL_CAPSULE | Freq: Three times a day (TID) | ORAL | Status: DC
  Administered 2017-01-11 – 2017-01-13 (×4): 100 mg via ORAL
  Filled 2017-01-10 (×7): qty 1

## 2017-01-10 MED ORDER — NICOTINE 14 MG/24HR TD PT24
14.0000 mg | MEDICATED_PATCH | Freq: Every day | TRANSDERMAL | Status: DC
  Administered 2017-01-10 – 2017-01-13 (×4): 14 mg via TRANSDERMAL
  Filled 2017-01-10 (×4): qty 1

## 2017-01-10 MED ORDER — IPRATROPIUM-ALBUTEROL 0.5-2.5 (3) MG/3ML IN SOLN
3.0000 mL | Freq: Once | RESPIRATORY_TRACT | Status: AC
  Administered 2017-01-10: 3 mL via RESPIRATORY_TRACT
  Filled 2017-01-10: qty 3

## 2017-01-10 MED ORDER — METHYLPREDNISOLONE SODIUM SUCC 125 MG IJ SOLR
60.0000 mg | Freq: Four times a day (QID) | INTRAMUSCULAR | Status: DC
  Administered 2017-01-10 – 2017-01-13 (×12): 60 mg via INTRAVENOUS
  Filled 2017-01-10 (×12): qty 2

## 2017-01-10 MED ORDER — LOSARTAN POTASSIUM 50 MG PO TABS
50.0000 mg | ORAL_TABLET | Freq: Every day | ORAL | Status: DC
  Administered 2017-01-10 – 2017-01-13 (×4): 50 mg via ORAL
  Filled 2017-01-10 (×4): qty 1

## 2017-01-10 MED ORDER — TIOTROPIUM BROMIDE MONOHYDRATE 2.5 MCG/ACT IN AERS: 2.0000 | INHALATION_SPRAY | Freq: Every day | RESPIRATORY_TRACT | Status: DC

## 2017-01-10 MED ORDER — GUAIFENESIN-DM 100-10 MG/5ML PO SYRP
5.0000 mL | ORAL_SOLUTION | ORAL | Status: DC | PRN
  Administered 2017-01-11: 5 mL via ORAL
  Filled 2017-01-10 (×2): qty 5

## 2017-01-10 MED ORDER — ONDANSETRON HCL 4 MG PO TABS: 4.0000 mg | ORAL_TABLET | Freq: Four times a day (QID) | ORAL | Status: DC | PRN

## 2017-01-10 MED ORDER — TIOTROPIUM BROMIDE MONOHYDRATE 18 MCG IN CAPS
18.0000 ug | ORAL_CAPSULE | Freq: Every day | RESPIRATORY_TRACT | Status: DC
  Administered 2017-01-11 – 2017-01-13 (×3): 18 ug via RESPIRATORY_TRACT
  Filled 2017-01-10: qty 5

## 2017-01-10 MED ORDER — SODIUM CHLORIDE 0.9 % IV SOLN
INTRAVENOUS | Status: DC
  Administered 2017-01-10: 13:00:00 via INTRAVENOUS

## 2017-01-10 MED ORDER — CHLORHEXIDINE GLUCONATE 0.12 % MT SOLN
15.0000 mL | Freq: Two times a day (BID) | OROMUCOSAL | Status: DC
  Administered 2017-01-11 (×2): 15 mL via OROMUCOSAL
  Filled 2017-01-10 (×5): qty 15

## 2017-01-10 MED ORDER — SODIUM CHLORIDE 0.9 % IV SOLN
INTRAVENOUS | Status: DC
  Administered 2017-01-10 – 2017-01-12 (×4): via INTRAVENOUS

## 2017-01-10 MED ORDER — ACETAMINOPHEN 650 MG RE SUPP: 650.0000 mg | Freq: Four times a day (QID) | RECTAL | Status: DC | PRN

## 2017-01-10 MED ORDER — ALBUTEROL SULFATE (2.5 MG/3ML) 0.083% IN NEBU
5.0000 mg | INHALATION_SOLUTION | Freq: Once | RESPIRATORY_TRACT | Status: AC
  Administered 2017-01-10: 5 mg via RESPIRATORY_TRACT
  Filled 2017-01-10: qty 6

## 2017-01-10 MED ORDER — SODIUM CHLORIDE 0.9 % IV BOLUS (SEPSIS)
500.0000 mL | Freq: Once | INTRAVENOUS | Status: AC
  Administered 2017-01-10: 500 mL via INTRAVENOUS

## 2017-01-10 MED ORDER — ATORVASTATIN CALCIUM 40 MG PO TABS
80.0000 mg | ORAL_TABLET | Freq: Every day | ORAL | Status: DC
  Administered 2017-01-10 – 2017-01-12 (×3): 80 mg via ORAL
  Filled 2017-01-10: qty 2
  Filled 2017-01-10: qty 1
  Filled 2017-01-10 (×2): qty 2

## 2017-01-10 MED ORDER — BENZONATATE 100 MG PO CAPS
100.0000 mg | ORAL_CAPSULE | Freq: Two times a day (BID) | ORAL | Status: DC | PRN
Start: 2017-01-10 — End: 2017-01-11
  Administered 2017-01-10: 100 mg via ORAL
  Filled 2017-01-10: qty 1

## 2017-01-10 MED ORDER — IPRATROPIUM-ALBUTEROL 0.5-2.5 (3) MG/3ML IN SOLN
3.0000 mL | Freq: Three times a day (TID) | RESPIRATORY_TRACT | Status: DC
  Administered 2017-01-11 – 2017-01-13 (×7): 3 mL via RESPIRATORY_TRACT
  Filled 2017-01-10 (×8): qty 3

## 2017-01-10 MED ORDER — IPRATROPIUM-ALBUTEROL 0.5-2.5 (3) MG/3ML IN SOLN
3.0000 mL | Freq: Four times a day (QID) | RESPIRATORY_TRACT | Status: DC
  Administered 2017-01-10: 3 mL via RESPIRATORY_TRACT
  Filled 2017-01-10: qty 3

## 2017-01-10 MED ORDER — ACETAMINOPHEN 325 MG PO TABS
650.0000 mg | ORAL_TABLET | Freq: Four times a day (QID) | ORAL | Status: DC | PRN
  Administered 2017-01-11 – 2017-01-13 (×4): 650 mg via ORAL
  Filled 2017-01-10 (×4): qty 2

## 2017-01-10 MED ORDER — ORAL CARE MOUTH RINSE
15.0000 mL | Freq: Two times a day (BID) | OROMUCOSAL | Status: DC
  Administered 2017-01-11: 15 mL via OROMUCOSAL

## 2017-01-10 MED ORDER — ENOXAPARIN SODIUM 40 MG/0.4ML ~~LOC~~ SOLN
40.0000 mg | SUBCUTANEOUS | Status: DC
  Administered 2017-01-10 – 2017-01-12 (×3): 40 mg via SUBCUTANEOUS
  Filled 2017-01-10 (×4): qty 0.4

## 2017-01-10 MED ORDER — POLYETHYLENE GLYCOL 3350 17 G PO PACK: 17.0000 g | PACK | Freq: Every day | ORAL | Status: DC | PRN

## 2017-01-10 NOTE — ED Triage Notes (Signed)
Patient brough in by EMS from home with complaints of gen weakess for the past 4 weeks have gotten worse in the past 2 days. Patient also has a productive cough yellow mucus. Hx COPD sats 92% wears home O2. Sats drop with ambulation.

## 2017-01-10 NOTE — ED Notes (Signed)
Report given to floor RN

## 2017-01-10 NOTE — ED Provider Notes (Signed)
[facility]-EMERGENCY DEPT Provider Note   CSN: [id number] Arrival date & time: 01/10/17  1026     History   Chief Complaint Chief Complaint  Patient presents with  . productive cough  . Weakness    HPI Judith Scott is a 62 y.o. female.  HPI Patient presents to the emergency room with complaints of shortness of breath, weakness and cough.  Patient states for the last several weeks she has felt generalized weakness then has been coughing a lot.  Her symptoms have been progressing.  She is bringing up yellow mucus.  She does have history of COPD and wears oxygen at home.  Patient is noticed that her oxygen is dropping whenever she tries to walk.  She has generalized body aches.  She unfortunately does continue to smoke cigarettes.  She is not sure she has had any fevers.  She denies any chest pain.  No vomiting or diarrhea. Past Medical History:  Diagnosis Date  . COPD (chronic obstructive pulmonary disease) (HCC)   . COPD exacerbation (HCC) 05/25/2012  . Hypercholesteremia   . Hypertension     Patient Active Problem List   Diagnosis Date Noted  . Healthcare maintenance 07/22/2016  . Sciatic nerve pain, right 05/17/2016  . Dyspnea on exertion 02/09/2016  . Wrist arthralgia 09/02/2015  . Cigarette smoker 12/21/2013  . COPD GOLD III  10/23/2013  . Lung nodule 10/23/2013  . Hyperlipidemia 10/13/2013  . Hyponatremia 10/13/2013  . Dehydration 10/13/2013  . Essential hypertension, benign 02/15/2013  . COPD exacerbation (HCC) 05/25/2012  . Tobacco abuse 05/25/2012  . Chronic respiratory failure with hypoxia (HCC) 05/25/2012    Past Surgical History:  Procedure Laterality Date  . APPENDECTOMY      OB History    No data available       Home Medications    Prior to Admission medications   Medication Sig Start Date End Date Taking? Authorizing Provider  albuterol (PROVENTIL HFA;VENTOLIN HFA) 108 (90 BASE) MCG/ACT inhaler Inhale 2 puffs into the  lungs every 4 (four) hours as needed for wheezing or shortness of breath. 10/17/14   Ambrose Finland, NP  albuterol (PROVENTIL) (2.5 MG/3ML) 0.083% nebulizer solution Take 3 mLs (2.5 mg total) by nebulization every 6 (six) hours as needed for wheezing or shortness of breath. 05/04/16   Leland Her, DO  amoxicillin-clavulanate (AUGMENTIN) 875-125 MG tablet Take 1 tablet by mouth 2 (two) times daily. 12/21/16   Casey Burkitt, MD  aspirin 81 MG chewable tablet Chew 81 mg by mouth daily.    [provider]  atorvastatin (LIPITOR) 80 MG tablet Take 1 tablet (80 mg total) by mouth daily at 6 PM. 08/19/16   Riccio, Marcell Anger, DO  benzonatate (TESSALON) 100 MG capsule Take 1 capsule (100 mg total) by mouth 2 (two) times daily as needed for cough. 12/21/16   Casey Burkitt, MD  etanercept (ENBREL) 25 MG injection Inject 25 mg into the skin once a week.    [provider]  gabapentin (NEURONTIN) 100 MG capsule Take 1 capsule (100 mg total) by mouth 3 (three) times daily. 07/22/16   Tillman Sers, DO  leflunomide (ARAVA) 20 MG tablet Take 20 mg by mouth daily. 02/01/16   [provider]  losartan (COZAAR) 50 MG tablet TAKE 1 TABLET BY MOUTH EVERY DAY 10/10/16   Dolores Patty C, DO  OXYGEN 3lpm with sleep and as needed during the day    [provider]  predniSONE (  DELTASONE) 2.5 MG tablet Take 5 mg by mouth daily with breakfast.    [provider]  predniSONE (DELTASONE) 50 MG tablet Take 1 tablet with breakfast for the next 5 days. 12/21/16   Casey Burkitt, MD  SPIRIVA RESPIMAT 2.5 MCG/ACT AERS INHALE 2 PUFFS BY MOUTH EACH MORNING 10/04/16   Coralyn Helling, MD  SYMBICORT 160-4.5 MCG/ACT inhaler TAKE 2 PUFFS BY MOUTH TWICE A DAY 01/06/17   Tillman Sers, DO    Family History Family History  Problem Relation Age of Onset  . 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 Smoking status: Current Every Day Smoker    Packs/day: 1.50    Years: 40.00    Pack years: 60.00    Types: Cigarettes  . Smokeless tobacco: Never Used  Substance Use Topics  . Alcohol use: Yes    Alcohol/week: 0.0 oz    Comment: occasional  . Drug use: No     Allergies   Patient has no known allergies.   Review of Systems Review of Systems  All other systems reviewed and are negative.    Physical Exam Updated Vital Signs BP (!) 156/73 (BP Location: Left Arm)   Pulse 87   Temp 98.6 F (37 C) (Oral)   Resp (!) 22   Ht 1.753 m (5\' 9" )   Wt 66.2 kg (146 lb)   SpO2 91%   BMI 21.56 kg/m   Physical Exam  Constitutional: She appears ill. No distress.  HENT:  Head: Normocephalic and atraumatic.  Right Ear: External ear normal.  Left Ear: External ear normal.  Eyes: Conjunctivae are normal. Right eye exhibits no discharge. Left eye exhibits no discharge. No scleral icterus.  Neck: Neck supple. No tracheal deviation present.  Cardiovascular: Normal rate, regular rhythm and intact distal pulses.  Pulmonary/Chest: Effort normal. No accessory muscle usage or stridor. No respiratory distress. She has decreased breath sounds. She has wheezes. She has no rales.  Abdominal: Soft. Bowel sounds are normal. She exhibits no distension. There is no tenderness. There is no rebound and no guarding.  Musculoskeletal: She exhibits no edema or tenderness.  Neurological: She is alert. She has normal strength. No cranial nerve deficit (no facial droop, extraocular movements intact, no slurred speech) or sensory deficit. She exhibits normal muscle tone. She displays no seizure activity. Coordination normal.  Skin: Skin is warm and dry. No rash noted.  Psychiatric: She has a normal mood and affect.  Nursing note and vitals reviewed.    ED Treatments / Results  Labs (all labs ordered are listed, but only abnormal results are displayed) Labs Reviewed  BASIC METABOLIC PANEL -  Abnormal; Notable for the following components:      Result Value   Chloride 93 (*)    CO2 38 (*)    Glucose, Bld 110 (*)    BUN <5 (*)    Creatinine, Ser 0.33 (*)    All other components within normal limits  BRAIN NATRIURETIC PEPTIDE - Abnormal; Notable for the following components:   B Natriuretic Peptide 145.4 (*)    All other components within normal limits  CBC WITH DIFFERENTIAL/PLATELET  INFLUENZA PANEL BY PCR (TYPE A & B)  I-STAT TROPONIN, ED    Radiology Dg Chest 2 View  Result Date: 01/10/2017 CLINICAL DATA:  Worsening cough. EXAM: CHEST  2 VIEW COMPARISON:  08/05/2016. FINDINGS:  Mediastinum hilar structures are normal. Mild basilar atelectasis. No definite pulmonary infiltrate. No pleural effusion or pneumothorax. Stable cardiomegaly. No pulmonary venous congestion. No acute bony abnormality. Degenerative changes thoracic spine. IMPRESSION: 1.  Mild basilar atelectasis pulmonary disease. 2.  Stable cardiomegaly.  No pulmonary venous congestion. Electronically Signed   By: Maisie Fus  Register   On: 01/10/2017 13:24    Procedures Procedures (including critical care time)  Medications Ordered in ED Medications  sodium chloride 0.9 % bolus 500 mL (0 mLs Intravenous Stopped 01/10/17 1358)    And  0.9 %  sodium chloride infusion ( Intravenous New Bag/Given 01/10/17 1234)  doxycycline (VIBRA-TABS) tablet 100 mg (not administered)  ipratropium-albuterol (DUONEB) 0.5-2.5 (3) MG/3ML nebulizer solution 3 mL (3 mLs Nebulization Given 01/10/17 1226)  methylPREDNISolone sodium succinate (SOLU-MEDROL) 125 mg/2 mL injection 125 mg (125 mg Intravenous Given 01/10/17 1225)  albuterol (PROVENTIL) (2.5 MG/3ML) 0.083% nebulizer solution 5 mg (5 mg Nebulization Given 01/10/17 1415)     Initial Impression / Assessment and Plan / ED Course  I have reviewed the triage vital signs and the nursing notes.  Pertinent labs & imaging results that were available during my care of the patient were reviewed by me  and considered in my medical decision making (see chart for details).  Clinical Course as of Jan 11 1519  Tue Jan 10, 2017  1507 Pt was given breathing treatments.   O2 sat still decreased and fatigued.  Pt is still not back to her baseline despite treatments.   [JK]  1517 Elevated bicarcb.   Likely has a history of co2 retention.  [JK]    Clinical Course User Index [JK] Linwood Dibbles, MD    Patient presented to the emergency room with complaints of cough, congestion and myalgias.  She appears to be having a flulike illness.  Chest x-ray does not show pneumonia.  Patient is also having increasing shortness of breath on top of her chronic COPD.  Her oxygen saturations are in the low 90s at rest despite supplemental oxygen.  She has not improved much with treatment in the ED.  Concerned about her sx worsening and her developing worsening resp difficulty.  Will consult with medical service for further treatment, copd exacerbation.  Final Clinical Impressions(s) / ED Diagnoses   Final diagnoses:  COPD exacerbation (HCC)  Acute bronchitis, unspecified organism      Linwood Dibbles, MD 01/10/17 1521

## 2017-01-10 NOTE — H&P (Signed)
History and Physical    Judith Scott [id number]:[id number] DOB: 1955/04/05 DOA: 01/10/2017  PCP: Tillman Sers, DO  Patient coming from:Home  Chief Complaint: Shortness of breath and cough worsening for 1-2 days.  HPI: Judith Scott is a 62 y.o. female with medical history significant of hypertension, hyperlipidemia, active smoker about a pack a day, steroid dependent COPD, chronic respiratory failure with hypoxia on 3 L of oxygen at home, presented with 1-2 days of worsening cough, shortness of breath.  The cough was productive, yellowish phlegm.  She was not able to complete sentences because of shortness of breath at home.  Patient has her symptoms gradual worsening for few weeks to month.  Patient denied fever, chills.  Denies runny nose, sore throat, headache, dizziness, nausea vomiting chest pain.  No abdominal pain.  No problem with urination or bowel movement.  Patient denied any home care services.  She lives by herself.  Patient reported that she does not need a cane or walker while walking. Denies sick contact.  As per ER note, patient reported congestion and myalgia.  ED Course: In the ER, patient was found to have oxygen saturation in low 90s with supplemental oxygen.  Influenza was negative.  Treated with Solu-Medrol, bronchodilators, IV fluid, doxycycline.  Admitted for COPD exacerbation.  Review of Systems: As per HPI otherwise 10 point review of systems negative.    Past Medical History:  Diagnosis Date  . COPD (chronic obstructive pulmonary disease) (HCC)   . COPD exacerbation (HCC) 05/25/2012  . Hypercholesteremia   . Hypertension     Past Surgical History:  Procedure Laterality Date  . APPENDECTOMY      Social history: Reports that she has been smoking cigarettes.  She has a 60.00 pack-year smoking history. she has never used smokeless tobacco. She reports that she drinks alcohol. She reports that she does not use drugs.  No Known Allergies  Family History  Problem  Relation Age of Onset  . 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 Authorizing Provider  albuterol (PROVENTIL HFA;VENTOLIN HFA) 108 (90 BASE) MCG/ACT inhaler Inhale 2 puffs into the lungs every 4 (four) hours as needed for wheezing or shortness of breath. 10/17/14  Yes Ambrose Finland, NP  albuterol (PROVENTIL) (2.5 MG/3ML) 0.083% nebulizer solution Take 3 mLs (2.5 mg total) by nebulization every 6 (six) hours as needed for wheezing or shortness of breath. 05/04/16  Yes Leland Her, DO  atorvastatin (LIPITOR) 80 MG tablet Take 1 tablet (80 mg total) by mouth daily at 6 PM. 08/19/16  Yes Riccio, Angela C, DO  benzonatate (TESSALON) 100 MG capsule Take 1 capsule (100 mg total) by mouth 2 (two) times daily as needed for cough. 12/21/16  Yes Casey Burkitt, MD  etanercept (ENBREL) 25 MG injection Inject 25 mg into the skin once a week.   Yes [provider]  gabapentin (NEURONTIN) 100 MG capsule Take 1 capsule (100 mg total) by mouth 3 (three) times daily. 07/22/16  Yes Riccio, Angela C, DO  leflunomide (ARAVA) 20 MG tablet Take 20 mg by mouth daily. 02/01/16  Yes [provider]  losartan (COZAAR) 50 MG tablet TAKE 1 TABLET BY MOUTH EVERY DAY 10/10/16  Yes Riccio, Angela C, DO  OXYGEN 3lpm with sleep and as needed during the day   Yes [provider]  predniSONE (  DELTASONE) 2.5 MG tablet Take 5 mg by mouth daily with breakfast.   Yes [provider]  SPIRIVA RESPIMAT 2.5 MCG/ACT AERS INHALE 2 PUFFS BY MOUTH EACH MORNING 10/04/16  Yes Coralyn Helling, MD  SYMBICORT 160-4.5 MCG/ACT inhaler TAKE 2 PUFFS BY MOUTH TWICE A DAY 01/06/17  Yes Riccio, Angela C, DO  amoxicillin-clavulanate (AUGMENTIN) 875-125 MG tablet Take 1 tablet by mouth 2 (two) times daily. Patient not taking: Reported on 01/10/2017 12/21/16   Casey Burkitt, MD  predniSONE (DELTASONE) 50 MG  tablet Take 1 tablet with breakfast for the next 5 days. Patient not taking: Reported on 01/10/2017 12/21/16   Casey Burkitt, MD    Physical Exam: Vitals:   01/10/17 1046 01/10/17 1047 01/10/17 1228 01/10/17 1513  BP: 130/73  (!) 156/73   Pulse: 90  87   Resp: 18  (!) 22   Temp:    98.6 F (37 C)  TempSrc:    Oral  SpO2: 92%  91%   Weight:  66.2 kg (146 lb)    Height:  5\' 9"  (1.753 m)        Constitutional: NAD, calm, comfortable Vitals:   01/10/17 1046 01/10/17 1047 01/10/17 1228 01/10/17 1513  BP: 130/73  (!) 156/73   Pulse: 90  87   Resp: 18  (!) 22   Temp:    98.6 F (37 C)  TempSrc:    Oral  SpO2: 92%  91%   Weight:  66.2 kg (146 lb)    Height:  5\' 9"  (1.753 m)     Eyes: PERRL, lids and conjunctivae normal ENMT: Mucous membranes are moist. Normal dentition.  Neck: normal, supple Respiratory: Bilateral expiratory mild wheeze.  No crackles appreciated.  Normal respiratory effort. No accessory muscle use.  Cardiovascular: Regular rate and rhythm, S1S2 Normal. no murmurs / rubs / gallops. No extremity edema.   Abdomen: Soft, Non-tender, non-distended. Bowel sounds positive.  Musculoskeletal: No joint deformity upper and lower extremities.  Skin: no rashes, lesions, ulcers. No induration Neurologic: No focal neurological deficit. Psychiatric: Normal judgment and insight. Alert and oriented x 3. Normal mood.    Labs on Admission: I have personally reviewed following labs and imaging studies  CBC: Recent Labs  Lab 01/10/17 1200  WBC 8.2  NEUTROABS 5.8  HGB 13.3  HCT 43.3  MCV 100.0  PLT 346   Basic Metabolic Panel: Recent Labs  Lab 01/10/17 1200  NA 139  K 4.1  CL 93*  CO2 38*  GLUCOSE 110*  BUN <5*  CREATININE 0.33*  CALCIUM 8.9   GFR: Estimated Creatinine Clearance: 75.2 mL/min (A) (by C-G formula based on SCr of 0.33 mg/dL (L)). Liver Function Tests: No results for input(s): AST, ALT, ALKPHOS, BILITOT, PROT, ALBUMIN in the last 168  hours. No results for input(s): LIPASE, AMYLASE in the last 168 hours. No results for input(s): AMMONIA in the last 168 hours. Coagulation Profile: No results for input(s): INR, PROTIME in the last 168 hours. Cardiac Enzymes: No results for input(s): CKTOTAL, CKMB, CKMBINDEX, TROPONINI in the last 168 hours. BNP (last 3 results) No results for input(s): PROBNP in the last 8760 hours. HbA1C: No results for input(s): HGBA1C in the last 72 hours. CBG: No results for input(s): GLUCAP in the last 168 hours. Lipid Profile: No results for input(s): CHOL, HDL, LDLCALC, TRIG, CHOLHDL, LDLDIRECT in the last 72 hours. Thyroid Function Tests: No results for input(s): TSH, T4TOTAL, FREET4, T3FREE, THYROIDAB in the last 72 hours. Anemia Panel:  No results for input(s): VITAMINB12, FOLATE, FERRITIN, TIBC, IRON, RETICCTPCT in the last 72 hours. Urine analysis:    Component Value Date/Time   COLORURINE YELLOW 10/13/2013 1255   APPEARANCEUR CLEAR 10/13/2013 1255   LABSPEC 1.013 10/13/2013 1255   PHURINE 5.5 10/13/2013 1255   GLUCOSEU NEGATIVE 10/13/2013 1255   HGBUR NEGATIVE 10/13/2013 1255   BILIRUBINUR NEGATIVE 10/13/2013 1255   KETONESUR NEGATIVE 10/13/2013 1255   PROTEINUR NEGATIVE 10/13/2013 1255   UROBILINOGEN 0.2 10/13/2013 1255   NITRITE NEGATIVE 10/13/2013 1255   LEUKOCYTESUR NEGATIVE 10/13/2013 1255   Sepsis Labs: !!!!!!!!!!!!!!!!!!!!!!!!!!!!!!!!!!!!!!!!!!!! @LABRCNTIP (procalcitonin:4,lacticidven:4) )No results found for this or any previous visit (from the past 240 hour(s)).   Radiological Exams on Admission: Dg Chest 2 View  Result Date: 01/10/2017 CLINICAL DATA:  Worsening cough. EXAM: CHEST  2 VIEW COMPARISON:  08/05/2016. FINDINGS: Mediastinum hilar structures are normal. Mild basilar atelectasis. No definite pulmonary infiltrate. No pleural effusion or pneumothorax. Stable cardiomegaly. No pulmonary venous congestion. No acute bony abnormality. Degenerative changes thoracic  spine. IMPRESSION: 1.  Mild basilar atelectasis pulmonary disease. 2.  Stable cardiomegaly.  No pulmonary venous congestion. Electronically Signed   By: 10/05/2016  Register   On: 01/10/2017 13:24     Assessment/Plan Active Problems:   COPD exacerbation (HCC)  #Acute COPD exacerbation likely in the setting of viral illness versus gradual worsening in COPD due to active smoking: -Chest x-ray with no sign of pneumonia however a lot of chronic changes. -I will check sputum culture, respiratory viral study, droplet precaution. Continue Solu-Medrol IV, DuoNeb nebulization, doxycycline. -Recommended to stop smoking. -Patient is a steroid dependent COPD -Continue Dulera -Cough medicine as needed. -Patient is euvolemic on exam.  She had echocardiogram in 03/2016 with EF of 60-65% with normal wall motion.  No sign of heart failure.  #Chronic respiratory failure with hypoxia: Currently in 3 L of oxygen which is her home dose.  Case manager referral.  #History of hypertension: Continue losartan.  Monitor blood pressure.  #Active smoker: Order nicotine patch.  #Hyperlipidemia: Continue statin.   DVT prophylaxis: Lovenox subcutaneous Code Status: Full code Family Communication: No family at bedside in ER Disposition Plan: MedSurg floor Consults called: None Admission status: Observation   Jden Want 04/2016 MD [facility] Pager [phone]  If 7PM-7AM, please contact night-coverage [email or web address] Password [email or web address]  01/10/2017, 4:16 PM

## 2017-01-11 DIAGNOSIS — J9611 Chronic respiratory failure with hypoxia: Secondary | ICD-10-CM | POA: Diagnosis present

## 2017-01-11 DIAGNOSIS — R52 Pain, unspecified: Secondary | ICD-10-CM | POA: Diagnosis not present

## 2017-01-11 DIAGNOSIS — Z7952 Long term (current) use of systemic steroids: Secondary | ICD-10-CM | POA: Diagnosis not present

## 2017-01-11 DIAGNOSIS — R1084 Generalized abdominal pain: Secondary | ICD-10-CM | POA: Diagnosis not present

## 2017-01-11 DIAGNOSIS — F17210 Nicotine dependence, cigarettes, uncomplicated: Secondary | ICD-10-CM | POA: Diagnosis present

## 2017-01-11 DIAGNOSIS — Z7951 Long term (current) use of inhaled steroids: Secondary | ICD-10-CM | POA: Diagnosis not present

## 2017-01-11 DIAGNOSIS — Z720 Tobacco use: Secondary | ICD-10-CM | POA: Diagnosis not present

## 2017-01-11 DIAGNOSIS — J209 Acute bronchitis, unspecified: Secondary | ICD-10-CM | POA: Diagnosis not present

## 2017-01-11 DIAGNOSIS — E7800 Pure hypercholesterolemia, unspecified: Secondary | ICD-10-CM | POA: Diagnosis present

## 2017-01-11 DIAGNOSIS — E785 Hyperlipidemia, unspecified: Secondary | ICD-10-CM | POA: Diagnosis present

## 2017-01-11 DIAGNOSIS — Z9981 Dependence on supplemental oxygen: Secondary | ICD-10-CM | POA: Diagnosis not present

## 2017-01-11 DIAGNOSIS — J441 Chronic obstructive pulmonary disease with (acute) exacerbation: Secondary | ICD-10-CM | POA: Diagnosis not present

## 2017-01-11 DIAGNOSIS — I10 Essential (primary) hypertension: Secondary | ICD-10-CM | POA: Diagnosis present

## 2017-01-11 LAB — RESPIRATORY PANEL BY PCR
ADENOVIRUS-RVPPCR: NOT DETECTED
Bordetella pertussis: NOT DETECTED
CORONAVIRUS NL63-RVPPCR: NOT DETECTED
CORONAVIRUS OC43-RVPPCR: NOT DETECTED
Chlamydophila pneumoniae: NOT DETECTED
Coronavirus 229E: NOT DETECTED
Coronavirus HKU1: NOT DETECTED
INFLUENZA A-RVPPCR: NOT DETECTED
INFLUENZA B-RVPPCR: NOT DETECTED
MYCOPLASMA PNEUMONIAE-RVPPCR: NOT DETECTED
Metapneumovirus: NOT DETECTED
PARAINFLUENZA VIRUS 1-RVPPCR: NOT DETECTED
PARAINFLUENZA VIRUS 2-RVPPCR: NOT DETECTED
Parainfluenza Virus 3: NOT DETECTED
Parainfluenza Virus 4: NOT DETECTED
RESPIRATORY SYNCYTIAL VIRUS-RVPPCR: NOT DETECTED
Rhinovirus / Enterovirus: NOT DETECTED

## 2017-01-11 LAB — HEPATIC FUNCTION PANEL
ALBUMIN: 2.6 g/dL — AB (ref 3.5–5.0)
ALK PHOS: 71 U/L (ref 38–126)
ALT: 10 U/L — ABNORMAL LOW (ref 14–54)
AST: 18 U/L (ref 15–41)
BILIRUBIN INDIRECT: 0.1 mg/dL — AB (ref 0.3–0.9)
BILIRUBIN TOTAL: 0.2 mg/dL — AB (ref 0.3–1.2)
Bilirubin, Direct: 0.1 mg/dL (ref 0.1–0.5)
TOTAL PROTEIN: 6.3 g/dL — AB (ref 6.5–8.1)

## 2017-01-11 LAB — CBC
HCT: 40.6 % (ref 36.0–46.0)
Hemoglobin: 12.6 g/dL (ref 12.0–15.0)
MCH: 30.7 pg (ref 26.0–34.0)
MCHC: 31 g/dL (ref 30.0–36.0)
MCV: 99 fL (ref 78.0–100.0)
Platelets: 366 10*3/uL (ref 150–400)
RBC: 4.1 MIL/uL (ref 3.87–5.11)
RDW: 13 % (ref 11.5–15.5)
WBC: 3.9 10*3/uL — ABNORMAL LOW (ref 4.0–10.5)

## 2017-01-11 LAB — BASIC METABOLIC PANEL
Anion gap: 8 (ref 5–15)
BUN: 9 mg/dL (ref 6–20)
CALCIUM: 8.7 mg/dL — AB (ref 8.9–10.3)
CO2: 36 mmol/L — AB (ref 22–32)
Chloride: 95 mmol/L — ABNORMAL LOW (ref 101–111)
Creatinine, Ser: 0.45 mg/dL (ref 0.44–1.00)
GFR calc Af Amer: >60 mL/min (ref >60)
GLUCOSE: 128 mg/dL — AB (ref 65–99)
Potassium: 4.2 mmol/L (ref 3.5–5.1)
Sodium: 139 mmol/L (ref 135–145)

## 2017-01-11 LAB — EXPECTORATED SPUTUM ASSESSMENT W GRAM STAIN, RFLX TO RESP C

## 2017-01-11 LAB — EXPECTORATED SPUTUM ASSESSMENT W REFEX TO RESP CULTURE

## 2017-01-11 MED ORDER — BUDESONIDE 0.25 MG/2ML IN SUSP
0.2500 mg | Freq: Two times a day (BID) | RESPIRATORY_TRACT | Status: DC
  Administered 2017-01-11 – 2017-01-13 (×4): 0.25 mg via RESPIRATORY_TRACT
  Filled 2017-01-11 (×4): qty 2

## 2017-01-11 MED ORDER — BENZONATATE 100 MG PO CAPS: 100.0000 mg | ORAL_CAPSULE | Freq: Two times a day (BID) | ORAL | Status: DC | PRN

## 2017-01-11 NOTE — Evaluation (Signed)
Physical Therapy Evaluation Patient Details Name: Judith Scott MRN: [id number] DOB: [birth year] Today's Date: 01/11/2017   History of Present Illness  Pt admitted with weakness, productive cough, COPD exacerbation.  Pt on home O2 3L  Clinical Impression  Pt admitted as above and currently mobilizing at MOD I to IND level including ambulating 200 ft unassisted and demonstrating good balance, stability and awareness of breathing deficits.  Discussed with RN and PT will sign off at this time with nursing to continue to ambulate pt.    Follow Up Recommendations No PT follow up    Equipment Recommendations  None recommended by PT    Recommendations for Other Services       Precautions / Restrictions Restrictions Weight Bearing Restrictions: No      Mobility  Bed Mobility Overal bed mobility: Modified Independent             General bed mobility comments: Pt unassisted to/from bed  Transfers Overall transfer level: Modified independent               General transfer comment: Pt stood with assist of UEs  Ambulation/Gait Ambulation/Gait assistance: Independent;Min guard Ambulation Distance (Feet): 200 Feet Assistive device: None Gait Pattern/deviations: Step-through pattern;Decreased step length - right;Decreased step length - left;Steppage Gait velocity: decr Gait velocity interpretation: Below normal speed for age/gender General Gait Details: Pt ambulating at mod pace demonstrating good safety awareness, no instability or LOB and good awareness of breathing limitations.  Pt desat to 83% on 3L but recovery to 88% within 2 minutes and with pt reporting SOB as mild  Stairs            Wheelchair Mobility    Modified Rankin (Stroke Patients Only)       Balance Overall balance assessment: No apparent balance deficits (not formally assessed)                                           Pertinent Vitals/Pain      Home Living Family/patient  expects to be discharged to:: Private residence Living Arrangements: Alone Available Help at Discharge: Family   Home Access: Stairs to enter Entrance Stairs-Rails: (one hand rails) Entrance Stairs-Number of Steps: 2 Home Layout: One level Home Equipment: (? 3:1, w/c, walker, tub seat from daughter) Additional Comments: separated from husband.  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 Daughter has had a CVA but could help if needed     Hand Dominance        Extremity/Trunk Assessment   Upper Extremity Assessment Upper Extremity Assessment: Overall WFL for tasks assessed    Lower Extremity Assessment Lower Extremity Assessment: Overall WFL for tasks assessed       Communication   Communication: No difficulties  Cognition Arousal/Alertness: Awake/alert Behavior During Therapy: WFL for tasks assessed/performed Overall Cognitive Status: Within Functional Limits for tasks assessed                                        General Comments      Exercises     Assessment/Plan    PT Assessment Patent does not need any further PT services  PT Problem  List         PT Treatment Interventions      PT Goals (Current goals can be found in the Care Plan section)  Acute Rehab PT Goals Patient Stated Goal: Home PT Goal Formulation: All assessment and education complete, DC therapy    Frequency     Barriers to discharge        Co-evaluation PT/OT/SLP Co-Evaluation/Treatment: Yes Reason for Co-Treatment: For patient/therapist safety PT goals addressed during session: Mobility/safety with mobility OT goals addressed during session: ADL's and self-care       AM-PAC PT "6 Clicks" Daily Activity  Outcome Measure Difficulty turning over in bed (including adjusting bedclothes, sheets and blankets)?: A Little Difficulty moving from lying on back to  sitting on the side of the bed? : A Little Difficulty sitting down on and standing up from a chair with arms (e.g., wheelchair, bedside commode, etc,.)?: A Little Help needed moving to and from a bed to chair (including a wheelchair)?: None Help needed walking in hospital room?: None Help needed climbing 3-5 steps with a railing? : None 6 Click Score: 21    End of Session Equipment Utilized During Treatment: Gait belt Activity Tolerance: Patient tolerated treatment well Patient left: in bed;with call bell/phone within reach Nurse Communication: Mobility status PT Visit Diagnosis: Difficulty in walking, not elsewhere classified (R26.2)    Time: 1425-1445 PT Time Calculation (min) (ACUTE ONLY): 20 min   Charges:     PT Treatments $Gait Training: 8-22 mins   PT G Codes:        Pg [phone]   Charli Liberatore 01/11/2017, 3:09 PM

## 2017-01-11 NOTE — Progress Notes (Signed)
Pt is resting in bed, oxygen sat is 94 on 4 liters. Decreased to 3 liters, rechecked after 15 min, 92% pm 3 liters. Will keep on 3 liters and observe. Pt to notify nursing if she feels short of breath. Natisha Trzcinski P Barnaby Rippeon

## 2017-01-11 NOTE — Progress Notes (Signed)
Pt 02 sat 87% on 3L. No acute distress. Consulted with Resp therapy and titrated 02 to 4L.  Will continue to monitor.

## 2017-01-11 NOTE — Progress Notes (Signed)
PT denies Flutter. States she has had in the past- not effective. RT encouraged PT to deep breathe and cough.

## 2017-01-11 NOTE — Progress Notes (Signed)
Nutrition Brief Note RD consulted via COPD gold protocol.  Weight is stable. PO 75%.  Wt Readings from Last 15 Encounters:  01/10/17 146 lb (66.2 kg)  12/21/16 146 lb (66.2 kg)  08/05/16 153 lb (69.4 kg)  07/22/16 152 lb (68.9 kg)  05/17/16 150 lb 9.6 oz (68.3 kg)  05/04/16 152 lb (68.9 kg)  02/17/16 147 lb (66.7 kg)  02/09/16 144 lb (65.3 kg)  09/02/15 146 lb (66.2 kg)  08/27/15 145 lb 12.8 oz (66.1 kg)  06/29/15 145 lb (65.8 kg)  04/02/15 144 lb 12.8 oz (65.7 kg)  03/19/15 144 lb (65.3 kg)  12/10/14 145 lb (65.8 kg)  10/17/14 143 lb (64.9 kg)    Body mass index is 21.56 kg/m. Patient meets criteria for normal based on current BMI.   Current diet order is heart healthy, patient is consuming approximately 75% of meals at this time. Labs and medications reviewed.   No nutrition interventions warranted at this time. If nutrition issues arise, please consult RD.   Tilda Franco, MS, RD, LDN [facility] Inpatient Clinical Dietitian Pager: [phone] After Hours Pager: [phone]

## 2017-01-11 NOTE — Progress Notes (Signed)
Pt ambulated in hallway with therapy on 3L [place], 02 ranged from 83-88%.  02 sat 94% on 3L [place] at rest. Pt tolerated ambulation fair with minimal SOB.

## 2017-01-11 NOTE — Progress Notes (Signed)
PROGRESS NOTE  Judith Scott DTO:[id number] DOB: 1955/12/10 DOA: 01/10/2017 PCP: Tillman Sers, DO   LOS: 0 days   Brief Narrative / Interim history: 62 y.o. female with medical history significant of hypertension, hyperlipidemia, active smoker about a pack a day, steroid dependent COPD, chronic respiratory failure with hypoxia on 3 L of oxygen at home, presented with 1-2 days of worsening cough, shortness of breath.    Assessment & Plan: Active Problems:   COPD exacerbation (HCC)   Acute COPD exacerbation -Likely in the setting of a viral illness, respiratory panel negative -Continue empiric doxycycline, duo nebs, IV steroids -Sputum cultures are pending -Add Pulmicort -Continues to have significant dyspnea with minimal ambulation, barely able to walk a few feet without having to stop and catch her breath.  Very high risk for decompensation given underlying severe COPD, continue to closely monitor -Patient is euvolemic on exam.  She had echocardiogram in 03/2016 with EF of 60-65% with normal wall motion.  No sign of heart failure.  Chronic respiratory failure with hypoxia  -Currently in 3 L of oxygen which is her home dose  History of hypertension  -Continue losartan.  Monitor blood pressure.  Active smoker  -Order nicotine patch. -Counseled for cessation  Hyperlipidemia  -Continue statin.   DVT prophylaxis: Lovenox Code Status: Full code Family Communication: daughter at bedside Disposition Plan: home when ready   Consultants:   None   Procedures:   None   Antimicrobials:  Doxycycline 1/8 >>   Subjective: -Continues to complain of significant dyspnea, she is okay at rest but barely able to ambulate even to the bedside commode  Objective: Vitals:   01/10/17 2226 01/11/17 0553 01/11/17 0758 01/11/17 1514  BP: (!) 128/55 133/64  139/75  Pulse: 87 86  (!) 109  Resp: 20 20  20   Temp: 98.4 F (36.9 C) 98.9 F (37.2 C)  98 F (36.7 C)  TempSrc: Oral  Oral  Oral  SpO2: 95% 90% 92% (!) 87%  Weight:      Height:        Intake/Output Summary (Last 24 hours) at 01/11/2017 1549 Last data filed at 01/11/2017 1036 Gross per 24 hour  Intake 1741.25 ml  Output -  Net 1741.25 ml   Filed Weights   01/10/17 1047  Weight: 66.2 kg (146 lb)    Examination:  Constitutional: NAD Eyes:  lids and conjunctivae normal ENMT: Mucous membranes are moist.  Neck: normal, supple Respiratory: Mild bilateral wheezing, no crackles, increased respiratory effort, Cardiovascular: Regular rate and rhythm, no murmurs / rubs / gallops. No LE edema.  Abdomen: no tenderness. Bowel sounds positive.  Musculoskeletal: no clubbing / cyanosis.  Skin: no rashes Neurologic: Nonfocal Psychiatric: Normal judgment and insight. Alert and oriented x 3. Normal mood.    Data Reviewed: I have independently reviewed following labs and imaging studies  Chest x-ray no acute findings, no infiltrates my evaluation  CBC: Recent Labs  Lab 01/10/17 1200 01/11/17 0432  WBC 8.2 3.9*  NEUTROABS 5.8  --   HGB 13.3 12.6  HCT 43.3 40.6  MCV 100.0 99.0  PLT 346 366   Basic Metabolic Panel: Recent Labs  Lab 01/10/17 1200 01/11/17 0432  NA 139 139  K 4.1 4.2  CL 93* 95*  CO2 38* 36*  GLUCOSE 110* 128*  BUN <5* 9  CREATININE 0.33* 0.45  CALCIUM 8.9 8.7*   GFR: Estimated Creatinine Clearance: 75.2 mL/min (by C-G formula based on SCr of 0.45 mg/dL). Liver Function Tests:  Recent Labs  Lab 01/11/17 0432  AST 18  ALT 10*  ALKPHOS 71  BILITOT 0.2*  PROT 6.3*  ALBUMIN 2.6*   No results for input(s): LIPASE, AMYLASE in the last 168 hours. No results for input(s): AMMONIA in the last 168 hours. Coagulation Profile: No results for input(s): INR, PROTIME in the last 168 hours. Cardiac Enzymes: No results for input(s): CKTOTAL, CKMB, CKMBINDEX, TROPONINI in the last 168 hours. BNP (last 3 results) No results for input(s): PROBNP in the last 8760 hours. HbA1C: No  results for input(s): HGBA1C in the last 72 hours. CBG: No results for input(s): GLUCAP in the last 168 hours. Lipid Profile: No results for input(s): CHOL, HDL, LDLCALC, TRIG, CHOLHDL, LDLDIRECT in the last 72 hours. Thyroid Function Tests: No results for input(s): TSH, T4TOTAL, FREET4, T3FREE, THYROIDAB in the last 72 hours. Anemia Panel: No results for input(s): VITAMINB12, FOLATE, FERRITIN, TIBC, IRON, RETICCTPCT in the last 72 hours. Urine analysis:    Component Value Date/Time   COLORURINE YELLOW 10/13/2013 1255   APPEARANCEUR CLEAR 10/13/2013 1255   LABSPEC 1.013 10/13/2013 1255   PHURINE 5.5 10/13/2013 1255   GLUCOSEU NEGATIVE 10/13/2013 1255   HGBUR NEGATIVE 10/13/2013 1255   BILIRUBINUR NEGATIVE 10/13/2013 1255   KETONESUR NEGATIVE 10/13/2013 1255   PROTEINUR NEGATIVE 10/13/2013 1255   UROBILINOGEN 0.2 10/13/2013 1255   NITRITE NEGATIVE 10/13/2013 1255   LEUKOCYTESUR NEGATIVE 10/13/2013 1255   Sepsis Labs: Invalid input(s): PROCALCITONIN, LACTICIDVEN  Recent Results (from the past 240 hour(s))  Respiratory Panel by PCR     Status: None   Collection Time: 01/10/17  6:38 PM  Result Value Ref Range Status   Adenovirus NOT DETECTED NOT DETECTED Final   Coronavirus 229E NOT DETECTED NOT DETECTED Final   Coronavirus HKU1 NOT DETECTED NOT DETECTED Final   Coronavirus NL63 NOT DETECTED NOT DETECTED Final   Coronavirus OC43 NOT DETECTED NOT DETECTED Final   Metapneumovirus NOT DETECTED NOT DETECTED Final   Rhinovirus / Enterovirus NOT DETECTED NOT DETECTED Final   Influenza A NOT DETECTED NOT DETECTED Final   Influenza B NOT DETECTED NOT DETECTED Final   Parainfluenza Virus 1 NOT DETECTED NOT DETECTED Final   Parainfluenza Virus 2 NOT DETECTED NOT DETECTED Final   Parainfluenza Virus 3 NOT DETECTED NOT DETECTED Final   Parainfluenza Virus 4 NOT DETECTED NOT DETECTED Final   Respiratory Syncytial Virus NOT DETECTED NOT DETECTED Final   Bordetella pertussis NOT DETECTED  NOT DETECTED Final   Chlamydophila pneumoniae NOT DETECTED NOT DETECTED Final   Mycoplasma pneumoniae NOT DETECTED NOT DETECTED Final      Radiology Studies: Dg Chest 2 View  Result Date: 01/10/2017 CLINICAL DATA:  Worsening cough. EXAM: CHEST  2 VIEW COMPARISON:  08/05/2016. FINDINGS: Mediastinum hilar structures are normal. Mild basilar atelectasis. No definite pulmonary infiltrate. No pleural effusion or pneumothorax. Stable cardiomegaly. No pulmonary venous congestion. No acute bony abnormality. Degenerative changes thoracic spine. IMPRESSION: 1.  Mild basilar atelectasis pulmonary disease. 2.  Stable cardiomegaly.  No pulmonary venous congestion. Electronically Signed   By: Maisie Fus  Register   On: 01/10/2017 13:24     Scheduled Meds: . atorvastatin  80 mg Oral q1800  . budesonide (PULMICORT) nebulizer solution  0.25 mg Nebulization BID  . chlorhexidine  15 mL Mouth Rinse BID  . doxycycline  100 mg Oral Q12H  . enoxaparin (LOVENOX) injection  40 mg Subcutaneous Q24H  . gabapentin  100 mg Oral TID  . ipratropium-albuterol  3 mL Nebulization TID  .  losartan  50 mg Oral Daily  . mouth rinse  15 mL Mouth Rinse q12n4p  . methylPREDNISolone (SOLU-MEDROL) injection  60 mg Intravenous Q6H  . nicotine  14 mg Transdermal Daily  . tiotropium  18 mcg Inhalation Daily   Continuous Infusions: . sodium chloride 75 mL/hr at 01/10/17 2040      Pamella Pert, MD, PhD [facility] Pager [phone]  If 7PM-7AM, please contact night-coverage [email or web address] Password [email or web address] 01/11/2017, 3:49 PM

## 2017-01-11 NOTE — Evaluation (Signed)
Occupational Therapy Evaluation Patient Details Name: Judith Scott MRN: [id number] DOB: 02/14/1955 Today's Date: 01/11/2017    History of Present Illness Pt admitted with weakness, productive cough, COPD exacerbation.  Pt on home O2 3L   Clinical Impression   This 62 year old female was admitted for the above. All education was completed. No further OT is needed at this time     Follow Up Recommendations  No OT follow up    Equipment Recommendations  None recommended by OT    Recommendations for Other Services       Precautions / Restrictions Restrictions Weight Bearing Restrictions: No      Mobility Bed Mobility Overal bed mobility: Modified Independent             General bed mobility comments: Pt unassisted to/from bed  Transfers Overall transfer level: Modified independent               General transfer comment: Pt stood with assist of UEs    Balance Overall balance assessment: No apparent balance deficits (not formally assessed)                                         ADL either performed or assessed with clinical judgement   ADL Overall ADL's : Modified independent;Independent                                       General ADL Comments: pt has a reacher:  discussed using this to retrieve items to decrease energy demands. She has a seat which she can place in the tub, also for energy conservation.  No LOB     Vision         Perception     Praxis      Pertinent Vitals/Pain       Hand Dominance     Extremity/Trunk Assessment Upper Extremity Assessment Upper Extremity Assessment: Overall WFL for tasks assessed   Lower Extremity Assessment Lower Extremity Assessment: Overall WFL for tasks assessed       Communication Communication Communication: No difficulties   Cognition Arousal/Alertness: Awake/alert Behavior During Therapy: WFL for tasks assessed/performed Overall Cognitive Status: Within  Functional Limits for tasks assessed                                     General Comments  sats 83-89% on 3 liters    Exercises     Shoulder Instructions      Home Living Family/patient expects to be discharged to:: Private residence Living Arrangements: Alone Available Help at Discharge: Family   Home Access: Stairs to enter Secretary/administrator of Steps: 2 Entrance Stairs-Rails: (one hand rails) Home Layout: One level     Bathroom Shower/Tub: Chief Strategy Officer: Handicapped height     Home Equipment: (? 3:1, w/c, walker, tub seat from daughter)   Additional Comments: separated from husband.  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 Daughter has had a CVA but could help if needed        OT Problem List:  OT Treatment/Interventions:      OT Goals(Current goals can be found in the care plan section) Acute Rehab OT Goals Patient Stated Goal: Home OT Goal Formulation: All assessment and education complete, DC therapy  OT Frequency:     Barriers to D/C:            Co-evaluation   Reason for Co-Treatment: For patient/therapist safety PT goals addressed during session: Mobility/safety with mobility OT goals addressed during session: ADL's and self-care      AM-PAC PT "6 Clicks" Daily Activity     Outcome Measure Help from another person eating meals?: None Help from another person taking care of personal grooming?: None Help from another person toileting, which includes using toliet, bedpan, or urinal?: None Help from another person bathing (including washing, rinsing, drying)?: None Help from another person to put on and taking off regular upper body clothing?: None Help from another person to put on and taking off regular lower body clothing?: None 6 Click Score: 24   End of Session    Activity  Tolerance: Patient tolerated treatment well Patient left: in bed;with call bell/phone within reach  OT Visit Diagnosis: Muscle weakness (generalized) (M62.81)                Time: 0355-9741 OT Time Calculation (min): 29 min Charges:  OT General Charges $OT Visit: 1 Visit OT Evaluation $OT Eval Low Complexity: 1 Low G-Codes:     [place], OTR/L [phone] 01/11/2017  Judith Scott 01/11/2017, 3:20 PM

## 2017-01-11 NOTE — Progress Notes (Signed)
CM consult for COPD Gold protocol. Pt does not qualify for COPD protocol as no previous hospital admission in past year listed in [place]. This CM met with pt for dc planning. Pt states she already receives home 02 from [facility] and has a travel tank for home. Pt states she has no need for any home health services or DME, and her daughter lives with her. No other CM needs communicated. Marney Doctor RN,BSN,NCM [phone]

## 2017-01-11 NOTE — Plan of Care (Signed)
  Safety: Ability to remain free from injury will improve 01/11/2017 2025 - Progressing by Antionette Char, RN

## 2017-01-12 ENCOUNTER — Inpatient Hospital Stay (HOSPITAL_COMMUNITY): Payer: Medicaid Other

## 2017-01-12 DIAGNOSIS — R1084 Generalized abdominal pain: Secondary | ICD-10-CM

## 2017-01-12 NOTE — Progress Notes (Signed)
PROGRESS NOTE  Judith Scott BHA:[id number] DOB: [birth year] DOA: 01/10/2017 PCP: Tillman Sers, DO   LOS: 1 day   Brief Narrative / Interim history: 62 y.o. female with medical history significant of hypertension, hyperlipidemia, active smoker about a pack a day, steroid dependent COPD, chronic respiratory failure with hypoxia on 3 L of oxygen at home, presented with 1-2 days of worsening cough, shortness of breath.  Assessment & Plan: Active Problems:   COPD exacerbation (HCC)  Acute COPD exacerbation -Likely in the setting of a non specific viral illness, respiratory panel negative -Continue empiric doxycycline, duo nebs, IV steroids -Sputum cultures are pending, for now GPC, GPR, GNC -continue Pulmicort -Still dyspneic with ambulation, however improved compared to yesterday, not yet back to baseline.  Continue current regimen -Patient is euvolemic on exam.  She had echocardiogram in 03/2016 with EF of 60-65% with normal wall motion.  No sign of heart failure.  Chronic respiratory failure with hypoxia  -Currently in 3 L of oxygen which is her home dose, she is okay at rest but still significantly dyspneic with ambulation  History of hypertension  -Continue losartan.  Monitor blood pressure.  139/84 this morning, continue current regimen  Active smoker  -Order nicotine patch. -Counseled for cessation, she is determined to quit today  Hyperlipidemia  -Continue statin.  Abdominal pain/fullness -Patient reports intermittent abdominal fullness and pain in the epigastric area right upper quadrant over the last month.  Will obtain a right upper quadrant ultrasound.   DVT prophylaxis: Lovenox Code Status: Full code Family Communication: No family at bedside Disposition Plan: home when ready   Consultants:   None   Procedures:   None   Antimicrobials:  Doxycycline 1/8 >>   Subjective: -Feels better,  Objective: Vitals:   01/11/17 2103 01/11/17 2155 01/12/17  0616 01/12/17 0833  BP:  139/66 139/84   Pulse:  (!) 103 86   Resp:  18 17   Temp:  98.5 F (36.9 C) 98.6 F (37 C)   TempSrc:  Oral Oral   SpO2: 90% 91% 93% (!) 87%  Weight:      Height:        Intake/Output Summary (Last 24 hours) at 06-04-202019 1136 Last data filed at 06-04-202019 0400 Gross per 24 hour  Intake 2068.75 ml  Output -  Net 2068.75 ml   Filed Weights   01/10/17 1047  Weight: 66.2 kg (146 lb)    Examination:  Constitutional: No distress Eyes: no scleral icterus, lids and conjunctivae normal ENMT: Moist mucous membranes Respiratory: Wheezing has now resolved, overall decreased breath sounds, no crackles, normal respiratory effort Cardiovascular: Regular rate and rhythm, no murmurs.  No edema. Abdomen: No significant tenderness, bowel sounds positive Skin: No rashes Neurologic: No focal findings  Data Reviewed: I have independently reviewed following labs and imaging studies   CBC: Recent Labs  Lab 01/10/17 1200 01/11/17 0432  WBC 8.2 3.9*  NEUTROABS 5.8  --   HGB 13.3 12.6  HCT 43.3 40.6  MCV 100.0 99.0  PLT 346 366   Basic Metabolic Panel: Recent Labs  Lab 01/10/17 1200 01/11/17 0432  NA 139 139  K 4.1 4.2  CL 93* 95*  CO2 38* 36*  GLUCOSE 110* 128*  BUN <5* 9  CREATININE 0.33* 0.45  CALCIUM 8.9 8.7*   GFR: Estimated Creatinine Clearance: 75.2 mL/min (by C-G formula based on SCr of 0.45 mg/dL). Liver Function Tests: Recent Labs  Lab 01/11/17 0432  AST 18  ALT 10*  ALKPHOS 71  BILITOT 0.2*  PROT 6.3*  ALBUMIN 2.6*   No results for input(s): LIPASE, AMYLASE in the last 168 hours. No results for input(s): AMMONIA in the last 168 hours. Coagulation Profile: No results for input(s): INR, PROTIME in the last 168 hours. Cardiac Enzymes: No results for input(s): CKTOTAL, CKMB, CKMBINDEX, TROPONINI in the last 168 hours. BNP (last 3 results) No results for input(s): PROBNP in the last 8760 hours. HbA1C: No results for input(s):  HGBA1C in the last 72 hours. CBG: No results for input(s): GLUCAP in the last 168 hours. Lipid Profile: No results for input(s): CHOL, HDL, LDLCALC, TRIG, CHOLHDL, LDLDIRECT in the last 72 hours. Thyroid Function Tests: No results for input(s): TSH, T4TOTAL, FREET4, T3FREE, THYROIDAB in the last 72 hours. Anemia Panel: No results for input(s): VITAMINB12, FOLATE, FERRITIN, TIBC, IRON, RETICCTPCT in the last 72 hours. Urine analysis:    Component Value Date/Time   COLORURINE YELLOW 10/13/2013 1255   APPEARANCEUR CLEAR 10/13/2013 1255   LABSPEC 1.013 10/13/2013 1255   PHURINE 5.5 10/13/2013 1255   GLUCOSEU NEGATIVE 10/13/2013 1255   HGBUR NEGATIVE 10/13/2013 1255   BILIRUBINUR NEGATIVE 10/13/2013 1255   KETONESUR NEGATIVE 10/13/2013 1255   PROTEINUR NEGATIVE 10/13/2013 1255   UROBILINOGEN 0.2 10/13/2013 1255   NITRITE NEGATIVE 10/13/2013 1255   LEUKOCYTESUR NEGATIVE 10/13/2013 1255   Sepsis Labs: Invalid input(s): PROCALCITONIN, LACTICIDVEN  Recent Results (from the past 240 hour(s))  Respiratory Panel by PCR     Status: None   Collection Time: 01/10/17  6:38 PM  Result Value Ref Range Status   Adenovirus NOT DETECTED NOT DETECTED Final   Coronavirus 229E NOT DETECTED NOT DETECTED Final   Coronavirus HKU1 NOT DETECTED NOT DETECTED Final   Coronavirus NL63 NOT DETECTED NOT DETECTED Final   Coronavirus OC43 NOT DETECTED NOT DETECTED Final   Metapneumovirus NOT DETECTED NOT DETECTED Final   Rhinovirus / Enterovirus NOT DETECTED NOT DETECTED Final   Influenza A NOT DETECTED NOT DETECTED Final   Influenza B NOT DETECTED NOT DETECTED Final   Parainfluenza Virus 1 NOT DETECTED NOT DETECTED Final   Parainfluenza Virus 2 NOT DETECTED NOT DETECTED Final   Parainfluenza Virus 3 NOT DETECTED NOT DETECTED Final   Parainfluenza Virus 4 NOT DETECTED NOT DETECTED Final   Respiratory Syncytial Virus NOT DETECTED NOT DETECTED Final   Bordetella pertussis NOT DETECTED NOT DETECTED Final    Chlamydophila pneumoniae NOT DETECTED NOT DETECTED Final   Mycoplasma pneumoniae NOT DETECTED NOT DETECTED Final  Culture, sputum-assessment     Status: None   Collection Time: 01/11/17  7:22 PM  Result Value Ref Range Status   Specimen Description EXPECTORATED SPUTUM  Final   Special Requests NONE  Final   Sputum evaluation THIS SPECIMEN IS ACCEPTABLE FOR SPUTUM CULTURE  Final   Report Status 01/11/2017 FINAL  Final  Culture, respiratory (NON-Expectorated)     Status: None (Preliminary result)   Collection Time: 01/11/17  7:22 PM  Result Value Ref Range Status   Specimen Description EXPECTORATED SPUTUM  Final   Special Requests NONE Reflexed from [id number]  Final   Gram Stain   Final    FEW WBC PRESENT, PREDOMINANTLY PMN MODERATE GRAM POSITIVE COCCI IN PAIRS IN CHAINS RARE GRAM POSITIVE RODS RARE GRAM NEGATIVE COCCOBACILLI Performed at [facility], [address], [place], [place] [address]    Culture PENDING  Incomplete   Report Status PENDING  Incomplete      Radiology Studies: Dg Chest 2  View  Result Date: 01/10/2017 CLINICAL DATA:  Worsening cough. EXAM: CHEST  2 VIEW COMPARISON:  08/05/2016. FINDINGS: Mediastinum hilar structures are normal. Mild basilar atelectasis. No definite pulmonary infiltrate. No pleural effusion or pneumothorax. Stable cardiomegaly. No pulmonary venous congestion. No acute bony abnormality. Degenerative changes thoracic spine. IMPRESSION: 1.  Mild basilar atelectasis pulmonary disease. 2.  Stable cardiomegaly.  No pulmonary venous congestion. Electronically Signed   By: Maisie Fus  Register   On: 01/10/2017 13:24     Scheduled Meds: . atorvastatin  80 mg Oral q1800  . budesonide (PULMICORT) nebulizer solution  0.25 mg Nebulization BID  . chlorhexidine  15 mL Mouth Rinse BID  . doxycycline  100 mg Oral Q12H  . enoxaparin (LOVENOX) injection  40 mg Subcutaneous Q24H  . gabapentin  100 mg Oral TID  . ipratropium-albuterol  3 mL Nebulization TID  .  losartan  50 mg Oral Daily  . mouth rinse  15 mL Mouth Rinse q12n4p  . methylPREDNISolone (SOLU-MEDROL) injection  60 mg Intravenous Q6H  . nicotine  14 mg Transdermal Daily  . tiotropium  18 mcg Inhalation Daily   Continuous Infusions: . sodium chloride 75 mL/hr at 01/11/17 2133    Pamella Pert, MD, PhD [facility] Pager [phone]  If 7PM-7AM, please contact night-coverage [email or web address] Password [facility] 01/12/2017, 11:36 AM   .

## 2017-01-13 DIAGNOSIS — R52 Pain, unspecified: Secondary | ICD-10-CM

## 2017-01-13 MED ORDER — DOXYCYCLINE HYCLATE 100 MG PO TABS: 100.0000 mg | ORAL_TABLET | Freq: Two times a day (BID) | ORAL | 0 refills | Status: DC

## 2017-01-13 MED ORDER — NICOTINE 14 MG/24HR TD PT24: 14.0000 mg | MEDICATED_PATCH | Freq: Every day | TRANSDERMAL | 0 refills | Status: DC

## 2017-01-13 MED ORDER — PREDNISONE 10 MG PO TABS: 10.0000 mg | ORAL_TABLET | Freq: Every day | ORAL | 0 refills | Status: DC

## 2017-01-13 MED ORDER — GUAIFENESIN-DM 100-10 MG/5ML PO SYRP: 5.0000 mL | ORAL_SOLUTION | ORAL | 0 refills | Status: DC | PRN

## 2017-01-13 NOTE — Progress Notes (Signed)
IV removed and discharge instructions with scripts given.  Taken out by [employer].  Family to drive home

## 2017-01-13 NOTE — Discharge Summary (Signed)
Physician Discharge Summary  Jason Hauge AVW:[id number] DOB: 04/01/1955 DOA: 01/10/2017  PCP: Tillman Sers, DO  Admit date: 01/10/2017 Discharge date: 01/13/2017  Admitted From: home Disposition:  home  Recommendations for Outpatient Follow-up:  1. Follow up with PCP in 1-2 weeks 2. Patient was discharged on a prednisone taper and 3 more days of antibiotics for COPD exacerbation  Home Health: None Equipment/Devices: None  Discharge Condition: Stable CODE STATUS: Full code Diet recommendation: Regular  HPI: Per Dr. Ronalee Belts, Judith Scott is a 62 y.o. female with medical history significant of hypertension, hyperlipidemia, active smoker about a pack a day, steroid dependent COPD, chronic respiratory failure with hypoxia on 3 L of oxygen at home, presented with 1-2 days of worsening cough, shortness of breath.  The cough was productive, yellowish phlegm.  She was not able to complete sentences because of shortness of breath at home.  Patient has her symptoms gradual worsening for few weeks to month.  Patient denied fever, chills.  Denies runny nose, sore throat, headache, dizziness, nausea vomiting chest pain.  No abdominal pain.  No problem with urination or bowel movement.  Patient denied any home care services.  She lives by herself.  Patient reported that she does not need a cane or walker while walking. Denies sick contact. As per ER note, patient reported congestion and myalgia. ED Course: In the ER, patient was found to have oxygen saturation in low 90s with supplemental oxygen.  Influenza was negative.  Treated with Solu-Medrol, bronchodilators, IV fluid, doxycycline.  Admitted for COPD exacerbation.    Hospital Course:  Acute COPD exacerbation -Likely in the setting of a non specific viral illness, respiratory panel negative -Patient was maintained on nebulizers, steroids, supportive treatment, empiric doxycycline, she improved and has returned to baseline.  She is able to  ambulate in the hallway without significant dyspnea, she is feeling back to normal and will be discharged home in stable condition with 3 additional days of antibiotics as well as a prednisone taper. Chronic respiratory failure with hypoxia  -Currently in 3 L of oxygen which is her home dose History of hypertension  -Continue home medications Active smoker  -Order nicotine patch. -Counseled for cessation, she is determined to quit  Hyperlipidemia  -Continue statin. Abdominal pain/fullness -Patient reports intermittent abdominal fullness and pain in the epigastric area right upper quadrant over the last month.  Abdominal ultrasound negative.  She has no pain, able to tolerate a diet, recommend further workup as an outpatient if this continues to be a persistent problem   Discharge Diagnoses:  Active Problems:   COPD exacerbation (HCC)     Discharge Instructions   Allergies as of 01/13/2017   No Known Allergies     Medication List    STOP taking these medications   amoxicillin-clavulanate 875-125 MG tablet Commonly known as:  AUGMENTIN     TAKE these medications   albuterol 108 (90 Base) MCG/ACT inhaler Commonly known as:  PROVENTIL HFA;VENTOLIN HFA Inhale 2 puffs into the lungs every 4 (four) hours as needed for wheezing or shortness of breath.   albuterol (2.5 MG/3ML) 0.083% nebulizer solution Commonly known as:  PROVENTIL Take 3 mLs (2.5 mg total) by nebulization every 6 (six) hours as needed for wheezing or shortness of breath.   atorvastatin 80 MG tablet Commonly known as:  LIPITOR Take 1 tablet (80 mg total) by mouth daily at 6 PM.   benzonatate 100 MG capsule Commonly known as:  TESSALON Take 1 capsule (100  mg total) by mouth 2 (two) times daily as needed for cough.   doxycycline 100 MG tablet Commonly known as:  VIBRA-TABS Take 1 tablet (100 mg total) by mouth every 12 (twelve) hours.   etanercept 25 MG injection Commonly known as:  ENBREL Inject 25 mg  into the skin once a week.   gabapentin 100 MG capsule Commonly known as:  NEURONTIN Take 1 capsule (100 mg total) by mouth 3 (three) times daily.   guaiFENesin-dextromethorphan 100-10 MG/5ML syrup Commonly known as:  ROBITUSSIN DM Take 5 mLs by mouth every 4 (four) hours as needed for cough.   leflunomide 20 MG tablet Commonly known as:  ARAVA Take 20 mg by mouth daily.   losartan 50 MG tablet Commonly known as:  COZAAR TAKE 1 TABLET BY MOUTH EVERY DAY   nicotine 14 mg/24hr patch Commonly known as:  NICODERM CQ - dosed in mg/24 hours Place 1 patch (14 mg total) onto the skin daily.   OXYGEN 3lpm with sleep and as needed during the day   predniSONE 2.5 MG tablet Commonly known as:  DELTASONE Take 5 mg by mouth daily with breakfast. What changed:  Another medication with the same name was changed. Make sure you understand how and when to take each.   predniSONE 10 MG tablet Commonly known as:  DELTASONE Take 1 tablet (10 mg total) by mouth daily. 4 tablets daily x 4 days then 3 daily x 4 days then 2 daily x 4 days then 1 daily x 4 days What changed:    medication strength  how much to take  how to take this  when to take this  additional instructions   SPIRIVA RESPIMAT 2.5 MCG/ACT Aers Generic drug:  Tiotropium Bromide Monohydrate INHALE 2 PUFFS BY MOUTH EACH MORNING   SYMBICORT 160-4.5 MCG/ACT inhaler Generic drug:  budesonide-formoterol TAKE 2 PUFFS BY MOUTH TWICE A DAY      Follow-up Information    Tillman Sers, DO. Schedule an appointment as soon as possible for a visit in 2 week(s).   Specialty:  Family Medicine Contact information: [address] [place] [address] [phone]           Consultations:  None   Procedures/Studies:  Dg Chest 2 View  Result Date: 01/10/2017 CLINICAL DATA:  Worsening cough. EXAM: CHEST  2 VIEW COMPARISON:  08/05/2016. FINDINGS: Mediastinum hilar structures are normal. Mild basilar atelectasis. No  definite pulmonary infiltrate. No pleural effusion or pneumothorax. Stable cardiomegaly. No pulmonary venous congestion. No acute bony abnormality. Degenerative changes thoracic spine. IMPRESSION: 1.  Mild basilar atelectasis pulmonary disease. 2.  Stable cardiomegaly.  No pulmonary venous congestion. Electronically Signed   By: Maisie Fus  Register   On: 01/10/2017 13:24   US Abdomen Complete  Result Date: 01/12/2017 CLINICAL DATA:  Abdominal pain. EXAM: ABDOMEN ULTRASOUND COMPLETE COMPARISON:  None. FINDINGS: Gallbladder: No gallstones or wall thickening visualized. No sonographic Murphy sign noted by sonographer. Common bile duct: Diameter: 2.9 mm, within normal limits. Liver: No focal lesion identified. Within normal limits in parenchymal echogenicity. Portal vein is patent on color Doppler imaging with normal direction of blood flow towards the liver. IVC: No abnormality visualized. Pancreas: Visualized portion unremarkable. Spleen: Size and appearance within normal limits. Right Kidney: Length: 10.6 cm, within normal limits. Echogenicity within normal limits. No mass or hydronephrosis visualized. Left Kidney: Length: 10.7 cm, within normal limits. Echogenicity within normal limits. No mass or hydronephrosis visualized. Abdominal aorta: No aneurysm visualized. Other findings: None. IMPRESSION:  Unremarkable ultrasound of the abdomen. No acute or focal lesion to explain the patient's symptoms. Electronically Signed   By: Marin Roberts M.D.   On: 01/12/2017 14:07      Subjective: - no chest pain, shortness of breath, no abdominal pain, nausea or vomiting.   Discharge Exam: Vitals:   01/13/17 0553 01/13/17 0748  BP:    Pulse: 66   Resp:    Temp:    SpO2: 92% 93%    General: Pt is alert, awake, not in acute distress Cardiovascular: RRR, S1/S2 +, no rubs, no gallops Respiratory: CTA bilaterally, no wheezing, no rhonchi Abdominal: Soft, NT, ND, bowel sounds + Extremities: no edema, no  cyanosis    The results of significant diagnostics from this hospitalization (including imaging, microbiology, ancillary and laboratory) are listed below for reference.     Microbiology: Recent Results (from the past 240 hour(s))  Respiratory Panel by PCR     Status: None   Collection Time: 01/10/17  6:38 PM  Result Value Ref Range Status   Adenovirus NOT DETECTED NOT DETECTED Final   Coronavirus 229E NOT DETECTED NOT DETECTED Final   Coronavirus HKU1 NOT DETECTED NOT DETECTED Final   Coronavirus NL63 NOT DETECTED NOT DETECTED Final   Coronavirus OC43 NOT DETECTED NOT DETECTED Final   Metapneumovirus NOT DETECTED NOT DETECTED Final   Rhinovirus / Enterovirus NOT DETECTED NOT DETECTED Final   Influenza A NOT DETECTED NOT DETECTED Final   Influenza B NOT DETECTED NOT DETECTED Final   Parainfluenza Virus 1 NOT DETECTED NOT DETECTED Final   Parainfluenza Virus 2 NOT DETECTED NOT DETECTED Final   Parainfluenza Virus 3 NOT DETECTED NOT DETECTED Final   Parainfluenza Virus 4 NOT DETECTED NOT DETECTED Final   Respiratory Syncytial Virus NOT DETECTED NOT DETECTED Final   Bordetella pertussis NOT DETECTED NOT DETECTED Final   Chlamydophila pneumoniae NOT DETECTED NOT DETECTED Final   Mycoplasma pneumoniae NOT DETECTED NOT DETECTED Final  Culture, sputum-assessment     Status: None   Collection Time: 01/11/17  7:22 PM  Result Value Ref Range Status   Specimen Description EXPECTORATED SPUTUM  Final   Special Requests NONE  Final   Sputum evaluation THIS SPECIMEN IS ACCEPTABLE FOR SPUTUM CULTURE  Final   Report Status 01/11/2017 FINAL  Final  Culture, respiratory (NON-Expectorated)     Status: None (Preliminary result)   Collection Time: 01/11/17  7:22 PM  Result Value Ref Range Status   Specimen Description EXPECTORATED SPUTUM  Final   Special Requests NONE Reflexed from [id number]  Final   Gram Stain   Final    FEW WBC PRESENT, PREDOMINANTLY PMN MODERATE GRAM POSITIVE COCCI IN PAIRS IN  CHAINS RARE GRAM POSITIVE RODS RARE GRAM NEGATIVE COCCOBACILLI    Culture   Final    CULTURE REINCUBATED FOR BETTER GROWTH Performed at [facility], [address], [place], [place] [address]    Report Status PENDING  Incomplete     Labs: BNP (last 3 results) Recent Labs    01/10/17 1200  BNP 145.4*   Basic Metabolic Panel: Recent Labs  Lab 01/10/17 1200 01/11/17 0432  NA 139 139  K 4.1 4.2  CL 93* 95*  CO2 38* 36*  GLUCOSE 110* 128*  BUN <5* 9  CREATININE 0.33* 0.45  CALCIUM 8.9 8.7*   Liver Function Tests: Recent Labs  Lab 01/11/17 0432  AST 18  ALT 10*  ALKPHOS 71  BILITOT 0.2*  PROT 6.3*  ALBUMIN 2.6*  No results for input(s): LIPASE, AMYLASE in the last 168 hours. No results for input(s): AMMONIA in the last 168 hours. CBC: Recent Labs  Lab 01/10/17 1200 01/11/17 0432  WBC 8.2 3.9*  NEUTROABS 5.8  --   HGB 13.3 12.6  HCT 43.3 40.6  MCV 100.0 99.0  PLT 346 366   Cardiac Enzymes: No results for input(s): CKTOTAL, CKMB, CKMBINDEX, TROPONINI in the last 168 hours. BNP: Invalid input(s): POCBNP CBG: No results for input(s): GLUCAP in the last 168 hours. D-Dimer No results for input(s): DDIMER in the last 72 hours. Hgb A1c No results for input(s): HGBA1C in the last 72 hours. Lipid Profile No results for input(s): CHOL, HDL, LDLCALC, TRIG, CHOLHDL, LDLDIRECT in the last 72 hours. Thyroid function studies No results for input(s): TSH, T4TOTAL, T3FREE, THYROIDAB in the last 72 hours.  Invalid input(s): FREET3 Anemia work up No results for input(s): VITAMINB12, FOLATE, FERRITIN, TIBC, IRON, RETICCTPCT in the last 72 hours. Urinalysis    Component Value Date/Time   COLORURINE YELLOW 10/13/2013 1255   APPEARANCEUR CLEAR 10/13/2013 1255   LABSPEC 1.013 10/13/2013 1255   PHURINE 5.5 10/13/2013 1255   GLUCOSEU NEGATIVE 10/13/2013 1255   HGBUR NEGATIVE 10/13/2013 1255   BILIRUBINUR NEGATIVE 10/13/2013 1255   KETONESUR NEGATIVE  10/13/2013 1255   PROTEINUR NEGATIVE 10/13/2013 1255   UROBILINOGEN 0.2 10/13/2013 1255   NITRITE NEGATIVE 10/13/2013 1255   LEUKOCYTESUR NEGATIVE 10/13/2013 1255   Sepsis Labs Invalid input(s): PROCALCITONIN,  WBC,  LACTICIDVEN   Time coordinating discharge: 40 minutes  SIGNED:  Pamella Pert, MD  [facility] 01/13/2017, 3:10 PM Pager [phone]  If 7PM-7AM, please contact night-coverage [email or web address] Password [email or web address]

## 2017-01-13 NOTE — Care Management Note (Signed)
Case Management Note  Patient Details  Name: Judith Scott MRN: [id number] Date of Birth: 20-Mar-1955  Subjective/Objective:   D/c home. Has home 02-[facility], & travel tank.No PT/OT f/u.No further CM needs.                 Action/Plan:d/c home.   Expected Discharge Date:  01/13/17               Expected Discharge Plan:  Home/Self Care  In-House Referral:     Discharge planning Services     Post Acute Care Choice:  Durable Medical Equipment(Active w/[facility] dme-home 02,has travel tank) Choice offered to:     DME Arranged:    DME Agency:     HH Arranged:    HH Agency:     Status of Service:  Completed, signed off  If discussed at [employer], dates discussed:    Additional Comments:  Tacy, Chavis, RN 01/13/2017, 11:07 AM

## 2017-01-13 NOTE — Discharge Instructions (Signed)
Follow with Tillman Sers, DO in 5-7 days  Continue Prednisone taper as instructed, when you finish the taper return to your chronic prednisone dosing.  Continue Antibiotics until finished.   Please get a complete blood count and chemistry panel checked by your Primary MD at your next visit, and again as instructed by your Primary MD. Please get your medications reviewed and adjusted by your Primary MD.  Please request your Primary MD to go over all Hospital Tests and Procedure/Radiological results at the follow up, please get all Hospital records sent to your Prim MD by signing hospital release before you go home.  If you had Pneumonia of Lung problems at the Hospital: Please get a 2 view Chest X ray done in 6-8 weeks after hospital discharge or sooner if instructed by your Primary MD.  If you have Congestive Heart Failure: Please call your Cardiologist or Primary MD anytime you have any of the following symptoms:  1) 3 pound weight gain in 24 hours or 5 pounds in 1 week  2) shortness of breath, with or without a dry hacking cough  3) swelling in the hands, feet or stomach  4) if you have to sleep on extra pillows at night in order to breathe  Follow cardiac low salt diet and 1.5 lit/day fluid restriction.  If you have diabetes Accuchecks 4 times/day, Once in AM empty stomach and then before each meal. Log in all results and show them to your primary doctor at your next visit. If any glucose reading is under 80 or above 300 call your primary MD immediately.  If you have Seizure/Convulsions/Epilepsy: Please do not drive, operate heavy machinery, participate in activities at heights or participate in high speed sports until you have seen by Primary MD or a Neurologist and advised to do so again.  If you had Gastrointestinal Bleeding: Please ask your Primary MD to check a complete blood count within one week of discharge or at your next visit. Your endoscopic/colonoscopic biopsies that  are pending at the time of discharge, will also need to followed by your Primary MD.  Get Medicines reviewed and adjusted. Please take all your medications with you for your next visit with your Primary MD  Please request your Primary MD to go over all hospital tests and procedure/radiological results at the follow up, please ask your Primary MD to get all Hospital records sent to his/her office.  If you experience worsening of your admission symptoms, develop shortness of breath, life threatening emergency, suicidal or homicidal thoughts you must seek medical attention immediately by calling 911 or calling your MD immediately  if symptoms less severe.  You must read complete instructions/literature along with all the possible adverse reactions/side effects for all the Medicines you take and that have been prescribed to you. Take any new Medicines after you have completely understood and accpet all the possible adverse reactions/side effects.   Do not drive or operate heavy machinery when taking Pain medications.   Do not take more than prescribed Pain, Sleep and Anxiety Medications  Special Instructions: If you have smoked or chewed Tobacco  in the last 2 yrs please stop smoking, stop any regular Alcohol  and or any Recreational drug use.  Wear Seat belts while driving.  Please note You were cared for by a hospitalist during your hospital stay. If you have any questions about your discharge medications or the care you received while you were in the hospital after you are discharged, you can  call the unit and asked to speak with the hospitalist on call if the hospitalist that took care of you is not available. Once you are discharged, your primary care physician will handle any further medical issues. Please note that NO REFILLS for any discharge medications will be authorized once you are discharged, as it is imperative that you return to your primary care physician (or establish a relationship  with a primary care physician if you do not have one) for your aftercare needs so that they can reassess your need for medications and monitor your lab values.  You can reach the hospitalist office at phone [phone] or fax [phone]   If you do not have a primary care physician, you can call [phone] for a physician referral.  Activity: As tolerated with Full fall precautions use walker/cane & assistance as needed  Diet: regular  Disposition Home

## 2017-01-14 LAB — CULTURE, RESPIRATORY: CULTURE: NORMAL

## 2017-01-14 LAB — CULTURE, RESPIRATORY W GRAM STAIN

## 2017-01-16 ENCOUNTER — Telehealth: Payer: Self-pay | Admitting: *Deleted

## 2017-01-16 NOTE — Telephone Encounter (Signed)
Patient left message on nurse line stating granddaughter has tested positive for influenza A and pt recently discharged from [facility] for COPD exacerbation. Wants to know what she can do. Returned pt's call. No answer. Left message on VM requesting return call. Kinnie Feil, RN, BSN

## 2017-01-16 NOTE — Telephone Encounter (Signed)
Second attempt to reach patient. No answer and this time line ended after many rings without going to VM. Kinnie Feil, RN, BSN

## 2017-01-17 ENCOUNTER — Telehealth: Payer: Self-pay | Admitting: *Deleted

## 2017-01-17 NOTE — Telephone Encounter (Signed)
Will forward to MD to advise. Kylinn Shropshire,CMA  

## 2017-01-17 NOTE — Telephone Encounter (Signed)
Patient informed.  Jazmin Hartsell,CMA  

## 2017-01-17 NOTE — Telephone Encounter (Signed)
She should avoid contact with her granddaughter while she is being treated for flu. Good hand washing etc. If Judith Scott develops flu like symptoms she should be seen immediately.

## 2017-01-17 NOTE — Telephone Encounter (Signed)
Pt returning [place] phone call, she is still concerned wether she should be worried about this or not.

## 2017-01-17 NOTE — Telephone Encounter (Signed)
Pt calls nurse line and states that she has had ankle, feet and leg swelling x 2 days.  The swelling goes down when she props them up She is not having any SOB or fevers and SPO2 is normal She declines increased salt intake.  She is agreeable to appt on Thursday (nothing available tomorrow) and will call us back if she starts having SOB or feeling worse.  Will forward to MD. Johnisha Louks, Maryjo Rochester, CMA

## 2017-01-18 NOTE — Telephone Encounter (Signed)
An appointment on Thursday sounds appropriate. Thanks for letting me know.

## 2017-01-19 ENCOUNTER — Other Ambulatory Visit: Payer: Self-pay

## 2017-01-19 ENCOUNTER — Encounter: Payer: Self-pay | Admitting: Student

## 2017-01-19 ENCOUNTER — Ambulatory Visit: Payer: Medicaid Other | Admitting: Student

## 2017-01-19 VITALS — BP 144/72 | HR 86 | Temp 98.9°F | Ht 64.0 in | Wt 146.8 lb

## 2017-01-19 DIAGNOSIS — R918 Other nonspecific abnormal finding of lung field: Secondary | ICD-10-CM

## 2017-01-19 DIAGNOSIS — R600 Localized edema: Secondary | ICD-10-CM

## 2017-01-19 MED ORDER — FUROSEMIDE 20 MG PO TABS: 20.0000 mg | ORAL_TABLET | Freq: Every day | ORAL | 3 refills | Status: DC | PRN

## 2017-01-19 NOTE — Patient Instructions (Signed)
It was great seeing you today! We have addressed the following issues today -Leg swelling: It is unclear what is causing the leg swelling.  We are running some blood tests to help [place].  We also ordered a CT of your chest.  Meanwhile, we sent a prescription for Lasix (fluid pill) to your pharmacy.  We also recommend using compression stocking and leg elevation.  Continue watching his salt intake.  Strongly recommend quitting smoking as well.  If we did any lab work today, and the results require attention, either me or my nurse will get in touch with you. If everything is normal, you will get a letter in mail and a message via . If you don't hear from [place] in two weeks, please give [place] a call. Otherwise, we look forward to seeing you again at your next visit. If you have any questions or concerns before then, please call the clinic at [phone].  Please bring all your medications to every doctors visit  Sign up for My Chart to have easy access to your labs results, and communication with your Primary care physician.    Please check-out at the front desk before leaving the clinic.    Take Care,   Dr. Alanda Slim

## 2017-01-19 NOTE — Progress Notes (Signed)
Subjective:    Judith Scott is a 62 y.o. old female here for leg swelling. She is here with her daughter.  HPI Leg swelling: bilateral. For one week.  She was recently hospitalized from 1/8-1/11 for COPD exacerbation.  She was discharged on prednisone, antibiotic and oxygen. Denies cough, shortness of breath or chest pain. Sleeps propped up for 3-5 years. No new changes.  Denies fever, weight loss, appetite change or blood in stool. Admits diffuse abdominal pain. She says she watches her salt intake. She is trying to follow DASH diet.  She has history of stable lung nodule noted on CT chest in 06/2015. No mention of follow up recommendation on this. She reported smoking a pack a day for the last 40 years.  She says she cut down to half a pack a day recently.  She still smokes while on oxygen.  PMH/Problem List: has COPD exacerbation ([place]); Tobacco abuse; Chronic respiratory failure with hypoxia ([place]); Essential hypertension, benign; Hyperlipidemia; Hyponatremia; Dehydration; COPD GOLD III ; Lung nodule; Cigarette smoker; Wrist arthralgia; Dyspnea on exertion; Sciatic nerve pain, right; and Healthcare maintenance on their problem list.   has a past medical history of COPD (chronic obstructive pulmonary disease) ([place]), COPD exacerbation ([place]) (05/25/2012), Hypercholesteremia, and Hypertension.  FH:  Family History  Problem Relation Age of Onset  . 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 Smoking status: Current Every Day Smoker    Packs/day: 1.50    Years: 40.00    Pack years: 60.00    Types: Cigarettes  . Smokeless tobacco: Never Used  Substance Use Topics  . Alcohol use: Yes    Alcohol/week: 0.0 oz    Comment: occasional  . Drug use: No    Review of Systems Review of systems negative except for pertinent positives and negatives in history of present illness above.     Objective:     Vitals:   01/19/17 1339  BP: (!)  144/72  Pulse: 86  Temp: 98.9 F (37.2 C)  TempSrc: Oral  SpO2: 90%  Weight: 146 lb 12.8 oz (66.6 kg)  Height: '5\' 4"'  (1.626 m)   Body mass index is 25.2 kg/m.  Physical Exam GEN: appears well, no apparent distress. EYES: No conjunctival redness. Sclera anicteric HEM: negative for cervical or periauricular lymphadenopathies CVS: RRR, nl s1 & s2, no murmurs, 2+ pitting edema bilaterally, 1+ DP/PT pulses bilaterally RESP: On oxygen, no IWOB, slightly diminished air movement on the left compared to right, some crackles on the left GI: BS present & normal.  Edema as above SKIN: no apparent skin lesion.  NEURO: alert and oiented appropriately, no gross deficits  PSYCH: euthymic mood with congruent affect     Assessment and Plan:  1. Bilateral lower extremity edema: Unclear etiology.  She just finished a steroid burst for COPD exacerbation.  Wonder if this is fluid retention related to her steroid use.  She has no new cardiopulmonary symptoms to think of CHF.  However, her BNP was elevated to 146 at her recent admission. Echo from 10 months ago basically normal. She also have slightly diminished air movement on the left compared to right. Recent hemoglobin 12.6 which makes anemia unlikely.  Her recent albumin was 2.6 at her recent hospitalization but that is in acute setting.  She has history of pulmonary nodules but no constitutional symptoms.  Her weight is stable.  She  continues to smoke while on oxygen. - We gave her Lasix 20 mg daily as needed for leg swelling.  I recommended compression stocking and leg elevation.  - We will obtain CT chest to exclude malignancy.  - C-reactive protein - CBC with Differential/Platelet to exclude anemia - Brain natriuretic peptide to exclude CHF - TSH to exclude thyroid issue - CMP14+EGFR  2. Abnormal findings on diagnostic imaging of lung: History of multiple stable lung nodules on his CT chest in 06/2015.  However, patient with over 40-pack-year  smoking history.  She is still smoking.  She already have bad COPD.  She is on oxygen. - CT Chest W Contrast; Future.  She has normal renal function.   Return if symptoms worsen or fail to improve.  Mercy Riding, MD 01/19/17 Pager: [phone]

## 2017-01-20 ENCOUNTER — Encounter: Payer: Self-pay | Admitting: Student

## 2017-01-20 DIAGNOSIS — M069 Rheumatoid arthritis, unspecified: Secondary | ICD-10-CM | POA: Insufficient documentation

## 2017-01-20 LAB — CBC WITH DIFFERENTIAL/PLATELET
BASOS: 0 %
Basophils Absolute: 0 10*3/uL (ref 0.0–0.2)
EOS (ABSOLUTE): 0 10*3/uL (ref 0.0–0.4)
EOS: 0 %
HEMATOCRIT: 42 % (ref 34.0–46.6)
Hemoglobin: 13.5 g/dL (ref 11.1–15.9)
IMMATURE GRANS (ABS): 0.1 10*3/uL (ref 0.0–0.1)
Immature Granulocytes: 1 %
LYMPHS: 9 %
Lymphocytes Absolute: 1.1 10*3/uL (ref 0.7–3.1)
MCH: 30.1 pg (ref 26.6–33.0)
MCHC: 32.1 g/dL (ref 31.5–35.7)
MCV: 94 fL (ref 79–97)
MONOCYTES: 0 %
Monocytes Absolute: 0 10*3/uL — ABNORMAL LOW (ref 0.1–0.9)
NEUTROS PCT: 90 %
Neutrophils Absolute: 11.8 10*3/uL — ABNORMAL HIGH (ref 1.4–7.0)
PLATELETS: 473 10*3/uL — AB (ref 150–379)
RBC: 4.48 x10E6/uL (ref 3.77–5.28)
RDW: 13.9 % (ref 12.3–15.4)
WBC: 13 10*3/uL — ABNORMAL HIGH (ref 3.4–10.8)

## 2017-01-20 LAB — CMP14+EGFR
A/G RATIO: 1.7 (ref 1.2–2.2)
ALT: 28 IU/L (ref 0–32)
AST: 26 IU/L (ref 0–40)
Albumin: 3.7 g/dL (ref 3.6–4.8)
Alkaline Phosphatase: 74 IU/L (ref 39–117)
BILIRUBIN TOTAL: 0.2 mg/dL (ref 0.0–1.2)
BUN/Creatinine Ratio: 9 — ABNORMAL LOW (ref 12–28)
BUN: 5 mg/dL — AB (ref 8–27)
CALCIUM: 9.2 mg/dL (ref 8.7–10.3)
CHLORIDE: 91 mmol/L — AB (ref 96–106)
CO2: 34 mmol/L — ABNORMAL HIGH (ref 20–29)
Creatinine, Ser: 0.55 mg/dL — ABNORMAL LOW (ref 0.57–1.00)
GFR calc Af Amer: 115 mL/min/{1.73_m2} (ref >59)
GFR calc non Af Amer: 100 mL/min/{1.73_m2} (ref >59)
GLUCOSE: 104 mg/dL — AB (ref 65–99)
Globulin, Total: 2.2 g/dL (ref 1.5–4.5)
POTASSIUM: 4.5 mmol/L (ref 3.5–5.2)
Sodium: 141 mmol/L (ref 134–144)
Total Protein: 5.9 g/dL — ABNORMAL LOW (ref 6.0–8.5)

## 2017-01-20 LAB — C-REACTIVE PROTEIN: CRP: 7.2 mg/L — ABNORMAL HIGH (ref 0.0–4.9)

## 2017-01-20 LAB — TSH: TSH: 1.13 u[IU]/mL (ref 0.450–4.500)

## 2017-01-20 LAB — BRAIN NATRIURETIC PEPTIDE: BNP: 103.7 pg/mL — AB (ref 0.0–100.0)

## 2017-01-25 ENCOUNTER — Telehealth: Payer: Self-pay | Admitting: Student

## 2017-01-25 NOTE — Telephone Encounter (Signed)
Called to discussed lab results with the patient.  CMP basically normal.  CBC with mild leukocytosis likely due to steroid.  BNP 104 but improved from previous.  CRP elevated to 7.2 (normal range 0.0-4.9) likely due to underlying rheumatoid disease.  Patient reports improvement in leg swelling with Lasix.  She reports using her Lasix only as needed.  She has not used Lasix in the last 2 days.  She says she has an appointment tomorrow.  I suggest checking her BMP.  I will forward this message to the provider who will see the patient tomorrow.

## 2017-01-26 ENCOUNTER — Encounter: Payer: Self-pay | Admitting: Family Medicine

## 2017-01-26 ENCOUNTER — Other Ambulatory Visit: Payer: Self-pay

## 2017-01-26 ENCOUNTER — Ambulatory Visit: Payer: Medicaid Other | Admitting: Family Medicine

## 2017-01-26 ENCOUNTER — Other Ambulatory Visit: Payer: Medicaid Other

## 2017-01-26 VITALS — BP 132/64 | HR 88 | Temp 98.8°F | Ht 64.0 in | Wt 139.0 lb

## 2017-01-26 DIAGNOSIS — R600 Localized edema: Secondary | ICD-10-CM | POA: Diagnosis not present

## 2017-01-26 DIAGNOSIS — Z7952 Long term (current) use of systemic steroids: Secondary | ICD-10-CM | POA: Diagnosis not present

## 2017-01-26 DIAGNOSIS — J9611 Chronic respiratory failure with hypoxia: Secondary | ICD-10-CM

## 2017-01-26 NOTE — Patient Instructions (Addendum)
Please finish the last 2 days of 10 mg of prednisone then go back to 5 mg daily until you see your rheumatologist.   Please call me if you need anything or want any help with quitting smoking. You're doing a great job by cutting down. Keep up the good work.  Dolores Patty, DO PGY-2, [facility] 01/26/2017 10:11 AM   Coping with Quitting Smoking Quitting smoking is a physical and mental challenge. You will face cravings, withdrawal symptoms, and temptation. Before quitting, work with your health care provider to make a plan that can help you cope. Preparation can help you quit and keep you from giving in. How can I cope with cravings? Cravings usually last for 5-10 minutes. If you get through it, the craving will pass. Consider taking the following actions to help you cope with cravings:  Keep your mouth busy: ? Chew sugar-free gum. ? Suck on hard candies or a straw. ? Brush your teeth.  Keep your hands and body busy: ? Immediately change to a different activity when you feel a craving. ? Squeeze or play with a ball. ? Do an activity or a hobby, like making bead jewelry, practicing needlepoint, or working with wood. ? Mix up your normal routine. ? Take a short exercise break. Go for a quick walk or run up and down stairs. ? Spend time in public places where smoking is not allowed.  Focus on doing something kind or helpful for someone else.  Call a friend or family member to talk during a craving.  Join a support group.  Call a quit line, such as 1-800-QUIT-NOW.  Talk with your health care provider about medicines that might help you cope with cravings and make quitting easier for you.  How can I deal with withdrawal symptoms? Your body may experience negative effects as it tries to get used to not having nicotine in the system. These effects are called withdrawal symptoms. They may include:  Feeling hungrier than normal.  Trouble  concentrating.  Irritability.  Trouble sleeping.  Feeling depressed.  Restlessness and agitation.  Craving a cigarette.  To manage withdrawal symptoms:  Avoid places, people, and activities that trigger your cravings.  Remember why you want to quit.  Get plenty of sleep.  Avoid coffee and other caffeinated drinks. These may worsen some of your symptoms.  How can I handle social situations? Social situations can be difficult when you are quitting smoking, especially in the first few weeks. To manage this, you can:  Avoid parties, bars, and other social situations where people might be smoking.  Avoid alcohol.  Leave right away if you have the urge to smoke.  Explain to your family and friends that you are quitting smoking. Ask for understanding and support.  Plan activities with friends or family where smoking is not an option.  What are some ways I can cope with stress? Wanting to smoke may cause stress, and stress can make you want to smoke. Find ways to manage your stress. Relaxation techniques can help. For example:  Breathe slowly and deeply, in through your nose and out through your mouth.  Listen to soothing, relaxing music.  Talk with a family member or friend about your stress.  Light a candle.  Soak in a bath or take a shower.  Think about a peaceful place.  What are some ways I can prevent weight gain? Be aware that many people gain weight after they quit smoking. However, not everyone does.  To keep from gaining weight, have a plan in place before you quit and stick to the plan after you quit. Your plan should include:  Having healthy snacks. When you have a craving, it may help to: ? Eat plain popcorn, crunchy carrots, celery, or other cut vegetables. ? Chew sugar-free gum.  Changing how you eat: ? Eat small portion sizes at meals. ? Eat 4-6 small meals throughout the day instead of 1-2 large meals a day. ? Be mindful when you eat. Do not watch  television or do other things that might distract you as you eat.  Exercising regularly: ? Make time to exercise each day. If you do not have time for a long workout, do short bouts of exercise for 5-10 minutes several times a day. ? Do some form of strengthening exercise, like weight lifting, and some form of aerobic exercise, like running or swimming.  Drinking plenty of water or other low-calorie or no-calorie drinks. Drink 6-8 glasses of water daily, or as much as instructed by your health care provider.  Summary  Quitting smoking is a physical and mental challenge. You will face cravings, withdrawal symptoms, and temptation to smoke again. Preparation can help you as you go through these challenges.  You can cope with cravings by keeping your mouth busy (such as by chewing gum), keeping your body and hands busy, and making calls to family, friends, or a helpline for people who want to quit smoking.  You can cope with withdrawal symptoms by avoiding places where people smoke, avoiding drinks with caffeine, and getting plenty of rest.  Ask your health care provider about the different ways to prevent weight gain, avoid stress, and handle social situations. This information is not intended to replace advice given to you by your health care provider. Make sure you discuss any questions you have with your health care provider. Document Released: 12/18/2015 Document Revised: 12/18/2015 Document Reviewed: 12/18/2015 Elsevier Interactive Patient Education  [employer].

## 2017-01-26 NOTE — Assessment & Plan Note (Signed)
  Increased oxygen requirement from baseline of 4L 24 hours a day. No fevers, chills, increased sputum production. Continue current inhaler regimen and supplemental oxygen. Finish steroid taper and resume 5 mg steroids daily. Strict return precautions given. Follow up 1 month.

## 2017-01-26 NOTE — Assessment & Plan Note (Signed)
  Patient was on 5mg  prednisone daily for quite some time for RA and then was hospitalized requiring stress dose steroids for COPD exacerbation. She has 2 days of 10 mg left. Discussed with her signs/symptoms of adrenal insufficiency and recommended she stay on 5 mg daily until she see her rheumatologist for further discussion/taper. She may need to be on chronic steroids due to lung function. Discussed this with her as well. She is to call or follow up if she experiences any issues once she decreases dose to 5 mg daily. Patient verbalized understanding and agreement with plan.

## 2017-01-26 NOTE — Assessment & Plan Note (Signed)
  Resolved with use of lasix for several days. Likely secondary to steroid use. Will check BMP today. Asked pt to follow up if swelling returns.

## 2017-01-26 NOTE — Progress Notes (Signed)
    Subjective:    Patient ID: Judith Scott, female    DOB: 07/26/1955, 62 y.o.   MRN: [id number]   CC: hosp follow up   Feet swelling better, no longer taking Lasix. She has some left to take intermittently as needed.  Breathing is better but not back to baseline. She feels the hospitalization really took a toll on her and scared her. She wants to quit smoking now as she realizes how much of a problem it is. She is down to 5-7 cigarettes a day. Favorites are after meals and upon waking up. She is trying to quit. Wants to do cold [place]. Declines any medication help. Feels she "needs to do this on my own".  No fevers/chills. No CP/SOB. Using albuterol 1-2 times a day. On 4 L oxygen 24 hours a day whereas previously she was on 2-3 L at night and w/ exertion.  Prior to being hospitalized she was on 5 mg prednisone daily for RA. She stopped this once she was being treated for COPD exacerbation and is now on a taper. She took 10 mg today and has 2 more days of 10 mg. She has a supply of 5 mg tablets at home. She has an appointment with rheumatology in Mid-February.   Smoking status reviewed- current smoker  Review of Systems- see HPI   Objective:  BP 132/64   Pulse 88   Temp 98.8 F (37.1 C) (Oral)   Ht 5\' 4"  (1.626 m)   Wt 139 lb (63 kg)   SpO2 (!) 89%   BMI 23.86 kg/m  Vitals and nursing note reviewed  General: appears older than stated age. In NAD, [place] in place HEENT: normocephalic, no scleral icterus or conjunctival pallor, moist mucous membranes Neck: supple, non-tender, without lymphadenopathy  Cardiac: RRR, clear S1 and S2, no murmurs, rubs, or gallops Respiratory: clear to auscultation bilaterally, no increased work of breathing Extremities: no edema or cyanosis. Skin: warm and dry, no rashes noted Neuro: alert and oriented, no focal deficits  Assessment & Plan:    Chronic respiratory failure with hypoxia (HCC)  Increased oxygen requirement from baseline of 4L 24  hours a day. No fevers, chills, increased sputum production. Continue current inhaler regimen and supplemental oxygen. Finish steroid taper and resume 5 mg steroids daily. Strict return precautions given. Follow up 1 month.   Current chronic use of systemic steroids  Patient was on 5mg  prednisone daily for quite some time for RA and then was hospitalized requiring stress dose steroids for COPD exacerbation. She has 2 days of 10 mg left. Discussed with her signs/symptoms of adrenal insufficiency and recommended she stay on 5 mg daily until she see her rheumatologist for further discussion/taper. She may need to be on chronic steroids due to lung function. Discussed this with her as well. She is to call or follow up if she experiences any issues once she decreases dose to 5 mg daily. Patient verbalized understanding and agreement with plan.   Bilateral leg edema  Resolved with use of lasix for several days. Likely secondary to steroid use. Will check BMP today. Asked pt to follow up if swelling returns.    Return in about 4 weeks (around 02/23/2017).   , DO Family Medicine Resident PGY-2

## 2017-01-27 ENCOUNTER — Encounter: Payer: Self-pay | Admitting: Family Medicine

## 2017-01-27 LAB — BMP8+EGFR
BUN / CREAT RATIO: 16 (ref 12–28)
BUN: 7 mg/dL — AB (ref 8–27)
CO2: 40 mmol/L — ABNORMAL HIGH (ref 20–29)
Calcium: 9.4 mg/dL (ref 8.7–10.3)
Chloride: 87 mmol/L — ABNORMAL LOW (ref 96–106)
Creatinine, Ser: 0.43 mg/dL — ABNORMAL LOW (ref 0.57–1.00)
GFR calc non Af Amer: 109 mL/min/{1.73_m2} (ref >59)
GFR, EST AFRICAN AMERICAN: 125 mL/min/{1.73_m2} (ref >59)
Glucose: 80 mg/dL (ref 65–99)
POTASSIUM: 4 mmol/L (ref 3.5–5.2)
Sodium: 141 mmol/L (ref 134–144)

## 2017-02-06 ENCOUNTER — Other Ambulatory Visit: Payer: Medicaid Other

## 2017-03-03 ENCOUNTER — Telehealth: Payer: Self-pay | Admitting: Family Medicine

## 2017-03-03 NOTE — Telephone Encounter (Signed)
Pt is calling for a refill on his pro air inhaler. The prescription has expired. jw

## 2017-03-06 ENCOUNTER — Other Ambulatory Visit: Payer: Self-pay | Admitting: Family Medicine

## 2017-03-06 DIAGNOSIS — J449 Chronic obstructive pulmonary disease, unspecified: Secondary | ICD-10-CM

## 2017-03-06 MED ORDER — ALBUTEROL SULFATE HFA 108 (90 BASE) MCG/ACT IN AERS: 2.0000 | INHALATION_SPRAY | RESPIRATORY_TRACT | 11 refills | Status: DC | PRN

## 2017-03-06 NOTE — Telephone Encounter (Signed)
Medication refilled.   Dolores Patty, DO PGY-2, [facility] 03/06/2017 9:23 AM

## 2017-04-17 ENCOUNTER — Other Ambulatory Visit: Payer: Self-pay | Admitting: Pulmonary Disease

## 2017-04-20 ENCOUNTER — Other Ambulatory Visit: Payer: Self-pay | Admitting: Family Medicine

## 2017-04-20 ENCOUNTER — Telehealth: Payer: Self-pay

## 2017-04-20 MED ORDER — TIOTROPIUM BROMIDE MONOHYDRATE 2.5 MCG/ACT IN AERS: INHALATION_SPRAY | RESPIRATORY_TRACT | 6 refills | Status: DC

## 2017-04-20 NOTE — Telephone Encounter (Signed)
Received fax from [facility] pharmacy requesting prior authorization of Spiriva Respimat .  Form placed in MD's box for completion along with Medicaid formulary.  Shawna Orleans, RN

## 2017-04-20 NOTE — Telephone Encounter (Signed)
Pt needs refill on her Spiriva.

## 2017-04-24 NOTE — Telephone Encounter (Signed)
Pt lm on nurse line.  She wanted to let the provider know that she is out of [place] currently.   Returned call to pt and let her know about the PA process.  She understands. We do not have samples of spiriva @ the moment, so she will call Dr. Sherene Sires to see if he has samples.   Kimla Furth, Maryjo Rochester, CMA

## 2017-04-25 ENCOUNTER — Other Ambulatory Visit: Payer: Self-pay | Admitting: Family Medicine

## 2017-04-25 MED ORDER — TIOTROPIUM BROMIDE MONOHYDRATE 18 MCG IN CAPS: 18.0000 ug | ORAL_CAPSULE | Freq: Every day | RESPIRATORY_TRACT | 12 refills | Status: AC

## 2017-04-25 NOTE — Telephone Encounter (Signed)
Sent in rx for spiriva handihaler instead of respimat as handihaler is on preferred list. I asked pharmacy in note to review how to use new inhaler, however it is the same medication.   Dolores Patty, DO PGY-2, [facility] 04/25/2017 3:49 PM

## 2017-05-08 ENCOUNTER — Other Ambulatory Visit: Payer: Self-pay

## 2017-05-08 ENCOUNTER — Ambulatory Visit: Payer: Medicaid Other | Admitting: Family Medicine

## 2017-05-08 ENCOUNTER — Encounter: Payer: Self-pay | Admitting: Family Medicine

## 2017-05-08 VITALS — BP 126/84 | HR 92 | Temp 98.9°F | Wt 131.0 lb

## 2017-05-08 DIAGNOSIS — R0602 Shortness of breath: Secondary | ICD-10-CM | POA: Diagnosis not present

## 2017-05-08 DIAGNOSIS — M7989 Other specified soft tissue disorders: Secondary | ICD-10-CM

## 2017-05-08 NOTE — Patient Instructions (Signed)
Please get a chest xray.   Some hose may be a good idea to help with your legs. Put them on first thing in the morning.   Take a lasix pill today and then one tomorrow if you still have swelling.  Things to watch out are worsening wheezing, difficulty breathing, difficulty laying down to breath, chest pain.

## 2017-05-08 NOTE — Progress Notes (Signed)
    Subjective:  Judith Scott is a 62 y.o. female who presents to the [facility] today with a chief complaint of leg swelling.   HPI:  Patient is here with complaint of leg swelling and also some shortness of breath.  She states that she last had leg swelling in January when she was on some higher dose of steroids.  However this resolved after taking 2 doses of Lasix at that time.  She states that she has been doing well since that time and her rheumatologist is actually been weaning her off prednisone and her last dose was 2.5 mg today. Over the last couple of weeks she has noticed some increased swelling in her legs.  Worse with standing.  She has not taken any Lasix although she does have some pills left over. He does have some increased shortness of breath compared to her baseline but no increased wheezing.   No orthopnea.  No sputum production.  No fevers or chills.  She does not have a increased oxygen requirement and is on her baseline 3 L via nasal cannula. She has had some sick contacts as a family member has walking pneumonia.  ROS: Per HPI  Objective:  Physical Exam: BP 126/84   Pulse 92   Temp 98.9 F (37.2 C) (Oral)   Wt 131 lb (59.4 kg)   SpO2 91%   BMI 22.49 kg/m   Gen: NAD, resting comfortably CV: RRR with no murmurs appreciated.  No JVD Pulm: NWOB on 3 L via nasal cannula.  Expiratory wheezes throughout but with good air movement bilaterally.  No crackles. GI: Normal bowel sounds present. Soft, Nontender, Nondistended. MSK: 2+ pitting edema to knees bilaterally Skin: warm, dry Neuro: grossly normal, moves all extremities Psych: Normal affect and thought content   Assessment/Plan:  1. Shortness of breath Patient has increased shortness of breath in the setting of significant underlying lung disease.  She does not appear to be an acute exacerbation given the lack of sputum production and the lack of systemic symptoms.  However given her exposure to sick contacts and her  poor respiratory reserve, will obtain chest x-ray today.  Discussed red flags and return precautions. - DG Chest 2 View; Future  2. Leg swelling Patient has had a history of leg swelling from fluid retention as a result of steroid use.  She has been tapered off prednisone but her last dose was today.  Given her successful treatment of leg swelling with 2 doses of p.o. Lasix 20mg  in the past, discussed with patient to trial Lasix today with an additional dose tomorrow if needed.  Patient to return to clinic if she continues to have leg swelling  , DO PGY-2, [facility] 05/08/2017 4:15 PM

## 2017-05-09 ENCOUNTER — Ambulatory Visit
Admission: RE | Admit: 2017-05-09 | Discharge: 2017-05-09 | Disposition: A | Discharge status: Home / Self Care | Payer: Medicaid Other | Source: Ambulatory Visit | Attending: Family Medicine | Admitting: Family Medicine

## 2017-05-09 DIAGNOSIS — R0602 Shortness of breath: Secondary | ICD-10-CM

## 2017-05-12 ENCOUNTER — Ambulatory Visit: Payer: Medicaid Other | Admitting: Family Medicine

## 2017-05-25 ENCOUNTER — Ambulatory Visit: Payer: Medicaid Other | Admitting: Internal Medicine

## 2017-05-25 VITALS — BP 119/80 | HR 76 | Temp 98.5°F | Wt 129.2 lb

## 2017-05-25 DIAGNOSIS — M7989 Other specified soft tissue disorders: Secondary | ICD-10-CM | POA: Diagnosis not present

## 2017-05-25 DIAGNOSIS — R197 Diarrhea, unspecified: Secondary | ICD-10-CM

## 2017-05-25 DIAGNOSIS — R05 Cough: Secondary | ICD-10-CM

## 2017-05-25 DIAGNOSIS — R059 Cough, unspecified: Secondary | ICD-10-CM

## 2017-05-25 DIAGNOSIS — J449 Chronic obstructive pulmonary disease, unspecified: Secondary | ICD-10-CM | POA: Diagnosis not present

## 2017-05-25 MED ORDER — PREDNISONE 20 MG PO TABS: 40.0000 mg | ORAL_TABLET | Freq: Every day | ORAL | 0 refills | Status: DC

## 2017-05-25 MED ORDER — LEVOFLOXACIN 750 MG PO TABS: 750.0000 mg | ORAL_TABLET | Freq: Every day | ORAL | 0 refills | Status: DC

## 2017-05-25 MED ORDER — ALBUTEROL SULFATE (2.5 MG/3ML) 0.083% IN NEBU: 2.5000 mg | INHALATION_SOLUTION | Freq: Four times a day (QID) | RESPIRATORY_TRACT | 12 refills | Status: DC | PRN

## 2017-05-25 NOTE — Progress Notes (Signed)
[facility] Progress Note  Subjective:  Judith Scott is a 62 y.o. 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 She is accompanied by her son.   #Cough: - Has been worse for past 3 weeks and has had increased sputum productions for about 1.5 weeks - Needs albuterol nebulizer solution refilled - Reports increased dyspnea but has not had to increase oxygen level - Had CXR negative for pneumonia earlier this month - several family members had had walking pneumonia ROS: No fever  #Diarrhea: - Present for last few days - No sick contacts with diarrhea - No obvious blood in still - Not completely watery, somewhat formed - No recent travel ROS: No vomiting  #Leg swelling: - Ongoing issue since January when admitted for COPD exacerbation, had thought secondary to steroid use for RA - Has improved in past with prn use of lasix; has not taken recently - Tries to keep feet up in the home  Past Medical History:  Diagnosis Date  . COPD (chronic obstructive pulmonary disease) (HCC)   . COPD exacerbation ([facility]CC) 05/25/2012  . Hypercholesteremia   . Hypertension     No Known Allergies  Social History   Tobacco Use  . Smoking status: Current Every Day Smoker    Packs/day: 1.50    Years: 40.00    Pack years: 60.00    Types: Cigarettes  . Smokeless tobacco: Never Used  Substance Use Topics  . Alcohol use: Yes    Alcohol/week: 0.0 oz    Comment: occasional    Objective: Blood pressure 119/80, pulse 76, temperature 98.5 F (36.9 C), temperature source Oral, weight 129 lb 3.2 oz (58.6 kg), SpO2 90 %. Body mass index is 22.18 kg/m. Constitutional: Pleasant female, appears older than stated age HENT: Mild erythema of posterior oropharynx.  Cardiovascular: RRR, S1, S2, no m/r/g. Palpable DP pulses bilaterally.  Pulmonary/Chest: Crackles across posterior left mid and lower lung  fields. No increased WOB.  Musculoskeletal: trace/1+ pitting edema of bilateral lower legs without calf tenderness or increased warmth.  Neurological: AOx3, no focal deficits. Psychiatric: Normal mood and affect.  Vitals reviewed  Assessment/Plan: Cough - Suspect PNA given focal crackles on lung exam vs COPD exacerbation with increased sputum production, cough and dyspnea. No confusion, low BP, or increased respiratory rate to suggest need for hospitalization.  - Prescribed levaquin x 7 days to cover for both COPD exacerbation and possible CAP. Also ordered prednisone 40 mg x 5 days. - Refilled albuterol nebulizer and recommended taking up to q4h for wheezing/increased SOB. - Counseled on smoking cessation but patient not ready to quit.   Diarrhea - May be in setting of respiratory infection. Somewhat formed and had been off antibiotics for a while so do not suspect C. diff.  - Recommended drinking plenty of fluids  Leg swelling - Chronic. Suspect venous stasis.  - May worsen with steroid use for COPD exacerbation - Recommended keeping feet elevated when not active and considering graded compression hose  Follow-up next week if no improvement or if having worsening shortness of breath go to Emergency Room.  Dani Gobble, MD [facility], PGY-3

## 2017-05-25 NOTE — Patient Instructions (Signed)
Ms. Rozman,  Please take levaquin 750 mg daily for the next week. Take prednisone 40 mg daily with breakfast the next 5 days. Use your albuterol up to every 4 hours as needed.  Please return next week if no improvement.  You may benefit from compression stockings for leg swelling if not improved after illness.  Best, Dr. Sampson Goon

## 2017-05-28 ENCOUNTER — Encounter: Payer: Self-pay | Admitting: Internal Medicine

## 2017-05-28 DIAGNOSIS — R05 Cough: Secondary | ICD-10-CM | POA: Insufficient documentation

## 2017-05-28 DIAGNOSIS — R059 Cough, unspecified: Secondary | ICD-10-CM | POA: Insufficient documentation

## 2017-05-28 DIAGNOSIS — R197 Diarrhea, unspecified: Secondary | ICD-10-CM | POA: Insufficient documentation

## 2017-05-28 DIAGNOSIS — M7989 Other specified soft tissue disorders: Secondary | ICD-10-CM | POA: Insufficient documentation

## 2017-05-28 NOTE — Assessment & Plan Note (Signed)
-   Suspect PNA given focal crackles on lung exam vs COPD exacerbation with increased sputum production, cough and dyspnea. No confusion, low BP, or increased respiratory rate to suggest need for hospitalization.  - Prescribed levaquin x 7 days to cover for both COPD exacerbation and possible CAP. Also ordered prednisone 40 mg x 5 days. - Refilled albuterol nebulizer and recommended taking up to q4h for wheezing/increased SOB. - Counseled on smoking cessation but patient not ready to quit.

## 2017-05-28 NOTE — Assessment & Plan Note (Addendum)
-   May be in setting of respiratory infection. Somewhat formed and had been off antibiotics for a while so do not suspect C. diff.  - Recommended drinking plenty of fluids

## 2017-05-28 NOTE — Assessment & Plan Note (Addendum)
-   Chronic. Suspect venous stasis.  - May worsen with steroid use for COPD exacerbation - Recommended keeping feet elevated when not active and considering graded compression hose

## 2017-06-08 ENCOUNTER — Ambulatory Visit: Payer: Medicaid Other | Admitting: Family Medicine

## 2017-06-08 ENCOUNTER — Telehealth: Payer: Self-pay

## 2017-06-08 ENCOUNTER — Encounter: Payer: Self-pay | Admitting: Family Medicine

## 2017-06-08 VITALS — BP 140/80 | HR 84 | Temp 98.1°F | Wt 129.6 lb

## 2017-06-08 DIAGNOSIS — R600 Localized edema: Secondary | ICD-10-CM

## 2017-06-08 DIAGNOSIS — R197 Diarrhea, unspecified: Secondary | ICD-10-CM

## 2017-06-08 NOTE — Telephone Encounter (Signed)
Pt called on nurse line. Still having diarrhea from pneumonia. Pt states this has been going on for 4-5 days. Scheduled a same day appt for her to see Dr. Artist Pais at 3:30 today. Sunday Spillers, CMA

## 2017-06-08 NOTE — Patient Instructions (Signed)
Get some compression stockings for your legs.  Try to eat some food, we are thinking the diarrhea is now more because you haven't eaten much food. As your appetite returns, we expect your diarrhea to resolve too.  We talked about symptoms to watch out for: fever, nausea, vomiting, blood in stool, etc.   Diarrhea, Adult Diarrhea is frequent loose and watery bowel movements. Diarrhea can make you feel weak and cause you to become dehydrated. Dehydration can make you tired and thirsty, cause you to have a dry mouth, and decrease how often you urinate. Diarrhea typically lasts 2-3 days. However, it can last longer if it is a sign of something more serious. It is important to treat your diarrhea as told by your health care provider. Follow these instructions at home: Eating and drinking  Follow these recommendations as told by your health care provider:  Take an oral rehydration solution (ORS). This is a drink that is sold at pharmacies and retail stores.  Drink clear fluids, such as water, ice chips, diluted fruit juice, and low-calorie sports drinks.  Eat bland, easy-to-digest foods in small amounts as you are able. These foods include bananas, applesauce, rice, lean meats, toast, and crackers.  Avoid drinking fluids that contain a lot of sugar or caffeine, such as energy drinks, sports drinks, and soda.  Avoid alcohol.  Avoid spicy or fatty foods.  General instructions  Drink enough fluid to keep your urine clear or pale yellow.  Wash your hands often. If soap and water are not available, use hand sanitizer.  Make sure that all people in your household wash their hands well and often.  Take over-the-counter and prescription medicines only as told by your health care provider.  Rest at home while you recover.  Watch your condition for any changes.  Take a warm bath to relieve any burning or pain from frequent diarrhea episodes.  Keep all follow-up visits as told by your health  care provider. This is important. Contact a health care provider if:  You have a fever.  Your diarrhea gets worse.  You have new symptoms.  You cannot keep fluids down.  You feel light-headed or dizzy.  You have a headache  You have muscle cramps. Get help right away if:  You have chest pain.  You feel extremely weak or you faint.  You have bloody or black stools or stools that look like tar.  You have severe pain, cramping, or bloating in your abdomen.  You have trouble breathing or you are breathing very quickly.  Your heart is beating very quickly.  Your skin feels cold and clammy.  You feel confused.  You have signs of dehydration, such as: ? Dark urine, very little urine, or no urine. ? Cracked lips. ? Dry mouth. ? Sunken eyes. ? Sleepiness. ? Weakness. This information is not intended to replace advice given to you by your health care provider. Make sure you discuss any questions you have with your health care provider. Document Released: 12/10/2001 Document Revised: 04/30/2015 Document Reviewed: 08/26/2014 Elsevier Interactive Patient Education  [employer].

## 2017-06-08 NOTE — Progress Notes (Signed)
    Subjective:  Judith Scott is a 62 y.o. female who presents to the [facility] today with a chief complaint of diarrhea.   HPI:   Patient was last seen on 05/25/2017 was found to have pneumonia started on Levaquin and prednisone.  She had few days of diarrhea at that time that was thought to be in setting of respiratory infection.  She now presents with continued diarrhea that has not worsened, perhaps got slightly better, but really has persisted.  She states that her pneumonia is now resolved she finished her antibiotics and feels well from that point of view.  She has not had much of appetite however has only in the last day or so started to eat food.  In fact she states that last night she ate her first bit of food which was an attic and she noticed that she did not have diarrhea after that for the first time.  She has been hydrating well by mouth.  She denies any abdominal pain, nausea, vomiting, blood in her stool.  She says that she has mostly been having watery bowel movements after she drinks.  She denies fever, chills.  Also of note she does have chronic leg swelling that she states worsened after being on prednisone.  She has not been able to keep her feet elevated.  She is not having any leg pain or redness.  ROS: Per HPI  Objective:  Physical Exam: BP 140/80   Pulse 84   Temp 98.1 F (36.7 C)   Wt 129 lb 9.6 oz (58.8 kg)   SpO2 92%   BMI 22.25 kg/m   Gen: NAD, resting comfortably CV: RRR with no murmurs appreciated Pulm: NWOB nasal cannula in place.  No wheezes, diminished air movement. GI: Normal bowel sounds present. Soft, Nontender, Nondistended.  No rebound or guarding.  No organomegaly. MSK: 2+ pitting edema Skin: warm, dry Neuro: grossly normal, moves all extremities Psych: Normal affect and thought content   Assessment/Plan:  1. Diarrhea, unspecified type Vitals and exam reassuring,  no red flags such as abdominal pain, fever, chills, nausea, vomiting, blood in stool.   Suspect that likely due to lack of intake especially given the momentary improvement after eating solid food.  Discussed that she should stay well-hydrated and monitor symptoms as she resumes eating solid food.  Discussed return to clinic if she has any red flag symptoms as per above.  Patient voiced good understanding.  2. Bilateral leg edema Likely worsened in the setting of recent steroid use.  Advised foot elevation and use of compression stockings.   - Compression stockings   Leland Her, DO PGY-2, [facility] 06/08/2017 3:44 PM

## 2017-06-13 ENCOUNTER — Ambulatory Visit: Payer: Medicaid Other | Admitting: Family Medicine

## 2017-06-15 ENCOUNTER — Other Ambulatory Visit: Payer: Self-pay | Admitting: Family Medicine

## 2017-06-27 ENCOUNTER — Other Ambulatory Visit: Payer: Self-pay | Admitting: Family Medicine

## 2017-06-27 DIAGNOSIS — I10 Essential (primary) hypertension: Secondary | ICD-10-CM

## 2017-08-12 ENCOUNTER — Other Ambulatory Visit: Payer: Self-pay | Admitting: Family Medicine

## 2017-10-31 ENCOUNTER — Other Ambulatory Visit: Payer: Self-pay

## 2017-10-31 ENCOUNTER — Inpatient Hospital Stay (HOSPITAL_COMMUNITY)
Admission: EM | Admit: 2017-10-31 | Discharge: 2017-11-02 | DRG: 190 | Disposition: A | Discharge status: Home / Self Care | Payer: Medicaid Other | Attending: Family Medicine | Admitting: Family Medicine

## 2017-10-31 ENCOUNTER — Emergency Department (HOSPITAL_COMMUNITY): Payer: Medicaid Other

## 2017-10-31 ENCOUNTER — Encounter (HOSPITAL_COMMUNITY): Payer: Self-pay | Admitting: Emergency Medicine

## 2017-10-31 DIAGNOSIS — J441 Chronic obstructive pulmonary disease with (acute) exacerbation: Principal | ICD-10-CM | POA: Diagnosis present

## 2017-10-31 DIAGNOSIS — J9811 Atelectasis: Secondary | ICD-10-CM | POA: Diagnosis present

## 2017-10-31 DIAGNOSIS — Z9981 Dependence on supplemental oxygen: Secondary | ICD-10-CM | POA: Diagnosis not present

## 2017-10-31 DIAGNOSIS — J449 Chronic obstructive pulmonary disease, unspecified: Secondary | ICD-10-CM | POA: Diagnosis present

## 2017-10-31 DIAGNOSIS — E876 Hypokalemia: Secondary | ICD-10-CM | POA: Diagnosis present

## 2017-10-31 DIAGNOSIS — Z716 Tobacco abuse counseling: Secondary | ICD-10-CM | POA: Diagnosis not present

## 2017-10-31 DIAGNOSIS — Z7951 Long term (current) use of inhaled steroids: Secondary | ICD-10-CM | POA: Diagnosis not present

## 2017-10-31 DIAGNOSIS — R0689 Other abnormalities of breathing: Secondary | ICD-10-CM | POA: Diagnosis not present

## 2017-10-31 DIAGNOSIS — E7800 Pure hypercholesterolemia, unspecified: Secondary | ICD-10-CM | POA: Diagnosis present

## 2017-10-31 DIAGNOSIS — I10 Essential (primary) hypertension: Secondary | ICD-10-CM | POA: Diagnosis present

## 2017-10-31 DIAGNOSIS — F17210 Nicotine dependence, cigarettes, uncomplicated: Secondary | ICD-10-CM | POA: Diagnosis present

## 2017-10-31 DIAGNOSIS — Z7952 Long term (current) use of systemic steroids: Secondary | ICD-10-CM

## 2017-10-31 DIAGNOSIS — Z801 Family history of malignant neoplasm of trachea, bronchus and lung: Secondary | ICD-10-CM

## 2017-10-31 DIAGNOSIS — I509 Heart failure, unspecified: Secondary | ICD-10-CM | POA: Diagnosis not present

## 2017-10-31 DIAGNOSIS — Z23 Encounter for immunization: Secondary | ICD-10-CM

## 2017-10-31 DIAGNOSIS — L8991 Pressure ulcer of unspecified site, stage 1: Secondary | ICD-10-CM | POA: Diagnosis present

## 2017-10-31 DIAGNOSIS — J440 Chronic obstructive pulmonary disease with acute lower respiratory infection: Secondary | ICD-10-CM | POA: Diagnosis present

## 2017-10-31 DIAGNOSIS — Z5329 Procedure and treatment not carried out because of patient's decision for other reasons: Secondary | ICD-10-CM | POA: Diagnosis present

## 2017-10-31 DIAGNOSIS — D509 Iron deficiency anemia, unspecified: Secondary | ICD-10-CM | POA: Diagnosis present

## 2017-10-31 DIAGNOSIS — J189 Pneumonia, unspecified organism: Secondary | ICD-10-CM | POA: Diagnosis present

## 2017-10-31 DIAGNOSIS — M069 Rheumatoid arthritis, unspecified: Secondary | ICD-10-CM | POA: Diagnosis present

## 2017-10-31 DIAGNOSIS — J9622 Acute and chronic respiratory failure with hypercapnia: Secondary | ICD-10-CM | POA: Diagnosis present

## 2017-10-31 DIAGNOSIS — L8990 Pressure ulcer of unspecified site, unspecified stage: Secondary | ICD-10-CM

## 2017-10-31 DIAGNOSIS — Z79899 Other long term (current) drug therapy: Secondary | ICD-10-CM

## 2017-10-31 DIAGNOSIS — M5440 Lumbago with sciatica, unspecified side: Secondary | ICD-10-CM | POA: Diagnosis present

## 2017-10-31 DIAGNOSIS — E43 Unspecified severe protein-calorie malnutrition: Secondary | ICD-10-CM

## 2017-10-31 DIAGNOSIS — J90 Pleural effusion, not elsewhere classified: Secondary | ICD-10-CM | POA: Diagnosis present

## 2017-10-31 DIAGNOSIS — J9621 Acute and chronic respiratory failure with hypoxia: Secondary | ICD-10-CM | POA: Diagnosis present

## 2017-10-31 DIAGNOSIS — R64 Cachexia: Secondary | ICD-10-CM | POA: Diagnosis present

## 2017-10-31 DIAGNOSIS — E785 Hyperlipidemia, unspecified: Secondary | ICD-10-CM | POA: Diagnosis present

## 2017-10-31 DIAGNOSIS — Z681 Body mass index (BMI) 19 or less, adult: Secondary | ICD-10-CM | POA: Diagnosis not present

## 2017-10-31 DIAGNOSIS — J181 Lobar pneumonia, unspecified organism: Secondary | ICD-10-CM | POA: Diagnosis not present

## 2017-10-31 DIAGNOSIS — E441 Mild protein-calorie malnutrition: Secondary | ICD-10-CM | POA: Diagnosis present

## 2017-10-31 LAB — I-STAT ARTERIAL BLOOD GAS, ED
Acid-Base Excess: 22 mmol/L — ABNORMAL HIGH (ref 0.0–2.0)
Bicarbonate: 52.6 mmol/L — ABNORMAL HIGH (ref 20.0–28.0)
O2 Saturation: 94 %
TCO2: >50 mmol/L — ABNORMAL HIGH (ref 22–32)
pCO2 arterial: 86.3 mmHg (ref 32.0–48.0)
pH, Arterial: 7.394 (ref 7.350–7.450)
pO2, Arterial: 78 mmHg — ABNORMAL LOW (ref 83.0–108.0)

## 2017-10-31 LAB — CBC WITH DIFFERENTIAL/PLATELET
Abs Immature Granulocytes: 0.03 10*3/uL (ref 0.00–0.07)
Basophils Absolute: 0.1 10*3/uL (ref 0.0–0.1)
Basophils Relative: 1 %
EOS PCT: 1 %
Eosinophils Absolute: 0.2 10*3/uL (ref 0.0–0.5)
HEMATOCRIT: 38.7 % (ref 36.0–46.0)
HEMOGLOBIN: 11.3 g/dL — AB (ref 12.0–15.0)
Immature Granulocytes: 0 %
Lymphocytes Relative: 9 %
Lymphs Abs: 1 10*3/uL (ref 0.7–4.0)
MCH: 28.5 pg (ref 26.0–34.0)
MCHC: 29.2 g/dL — AB (ref 30.0–36.0)
MCV: 97.7 fL (ref 80.0–100.0)
MONOS PCT: 14 %
Monocytes Absolute: 1.5 10*3/uL — ABNORMAL HIGH (ref 0.1–1.0)
Neutro Abs: 8.1 10*3/uL — ABNORMAL HIGH (ref 1.7–7.7)
Neutrophils Relative %: 75 %
Platelets: 459 10*3/uL — ABNORMAL HIGH (ref 150–400)
RBC: 3.96 MIL/uL (ref 3.87–5.11)
RDW: 13.4 % (ref 11.5–15.5)
WBC: 10.8 10*3/uL — ABNORMAL HIGH (ref 4.0–10.5)
nRBC: 0 % (ref 0.0–0.2)

## 2017-10-31 LAB — CBC
HEMATOCRIT: 38.7 % (ref 36.0–46.0)
Hemoglobin: 11.1 g/dL — ABNORMAL LOW (ref 12.0–15.0)
MCH: 28 pg (ref 26.0–34.0)
MCHC: 28.7 g/dL — AB (ref 30.0–36.0)
MCV: 97.5 fL (ref 80.0–100.0)
Platelets: 470 10*3/uL — ABNORMAL HIGH (ref 150–400)
RBC: 3.97 MIL/uL (ref 3.87–5.11)
RDW: 13.3 % (ref 11.5–15.5)
WBC: 11.3 10*3/uL — ABNORMAL HIGH (ref 4.0–10.5)
nRBC: 0 % (ref 0.0–0.2)

## 2017-10-31 LAB — BRAIN NATRIURETIC PEPTIDE: B Natriuretic Peptide: 362.7 pg/mL — ABNORMAL HIGH (ref 0.0–100.0)

## 2017-10-31 LAB — URINALYSIS, ROUTINE W REFLEX MICROSCOPIC
BACTERIA UA: NONE SEEN
BILIRUBIN URINE: NEGATIVE
Glucose, UA: NEGATIVE mg/dL
KETONES UR: NEGATIVE mg/dL
LEUKOCYTES UA: NEGATIVE
NITRITE: NEGATIVE
PROTEIN: 30 mg/dL — AB
Specific Gravity, Urine: 1.004 — ABNORMAL LOW (ref 1.005–1.030)
pH: 8 (ref 5.0–8.0)

## 2017-10-31 LAB — MAGNESIUM: MAGNESIUM: 1.5 mg/dL — AB (ref 1.7–2.4)

## 2017-10-31 LAB — BASIC METABOLIC PANEL
Anion gap: 10 (ref 5–15)
BUN: 5 mg/dL — AB (ref 8–23)
CO2: 43 mmol/L — AB (ref 22–32)
CREATININE: 0.32 mg/dL — AB (ref 0.44–1.00)
Calcium: 8.8 mg/dL — ABNORMAL LOW (ref 8.9–10.3)
Chloride: 86 mmol/L — ABNORMAL LOW (ref 98–111)
GFR calc Af Amer: >60 mL/min (ref >60)
GFR calc non Af Amer: >60 mL/min (ref >60)
GLUCOSE: 114 mg/dL — AB (ref 70–99)
Potassium: 3.4 mmol/L — ABNORMAL LOW (ref 3.5–5.1)
SODIUM: 139 mmol/L (ref 135–145)

## 2017-10-31 LAB — I-STAT TROPONIN, ED: Troponin i, poc: 0.01 ng/mL (ref 0.00–0.08)

## 2017-10-31 MED ORDER — SODIUM CHLORIDE 0.9 % IV SOLN
500.0000 mg | Freq: Once | INTRAVENOUS | Status: AC
  Administered 2017-10-31: 500 mg via INTRAVENOUS
  Filled 2017-10-31: qty 500

## 2017-10-31 MED ORDER — UMECLIDINIUM BROMIDE 62.5 MCG/INH IN AEPB
1.0000 | INHALATION_SPRAY | Freq: Every day | RESPIRATORY_TRACT | Status: DC
  Administered 2017-11-01 – 2017-11-02 (×2): 1 via RESPIRATORY_TRACT
  Filled 2017-10-31: qty 7

## 2017-10-31 MED ORDER — POTASSIUM CHLORIDE CRYS ER 20 MEQ PO TBCR
30.0000 meq | EXTENDED_RELEASE_TABLET | Freq: Once | ORAL | Status: AC
  Administered 2017-10-31: 30 meq via ORAL
  Filled 2017-10-31: qty 1

## 2017-10-31 MED ORDER — INFLUENZA VAC SPLIT QUAD 0.5 ML IM SUSY
0.5000 mL | PREFILLED_SYRINGE | INTRAMUSCULAR | Status: AC
  Administered 2017-11-01: 0.5 mL via INTRAMUSCULAR
  Filled 2017-10-31: qty 0.5

## 2017-10-31 MED ORDER — NICOTINE 14 MG/24HR TD PT24: 14.0000 mg | MEDICATED_PATCH | Freq: Every day | TRANSDERMAL | Status: DC

## 2017-10-31 MED ORDER — ENOXAPARIN SODIUM 40 MG/0.4ML ~~LOC~~ SOLN
40.0000 mg | SUBCUTANEOUS | Status: DC
  Administered 2017-11-01: 40 mg via SUBCUTANEOUS
  Filled 2017-10-31 (×2): qty 0.4

## 2017-10-31 MED ORDER — NICOTINE 21 MG/24HR TD PT24
21.0000 mg | MEDICATED_PATCH | Freq: Every day | TRANSDERMAL | Status: DC
  Administered 2017-10-31 – 2017-11-02 (×3): 21 mg via TRANSDERMAL
  Filled 2017-10-31 (×3): qty 1

## 2017-10-31 MED ORDER — FUROSEMIDE 10 MG/ML IJ SOLN
20.0000 mg | Freq: Once | INTRAMUSCULAR | Status: AC
  Administered 2017-10-31: 20 mg via INTRAVENOUS
  Filled 2017-10-31: qty 2

## 2017-10-31 MED ORDER — MOMETASONE FURO-FORMOTEROL FUM 200-5 MCG/ACT IN AERO
2.0000 | INHALATION_SPRAY | Freq: Two times a day (BID) | RESPIRATORY_TRACT | Status: DC
  Administered 2017-10-31 – 2017-11-02 (×4): 2 via RESPIRATORY_TRACT
  Filled 2017-10-31: qty 8.8

## 2017-10-31 MED ORDER — CEFTRIAXONE SODIUM 1 G IJ SOLR
1.0000 g | Freq: Once | INTRAMUSCULAR | Status: AC
  Administered 2017-10-31: 1 g via INTRAVENOUS
  Filled 2017-10-31: qty 10

## 2017-10-31 MED ORDER — LEFLUNOMIDE 20 MG PO TABS
20.0000 mg | ORAL_TABLET | Freq: Every day | ORAL | Status: DC
  Administered 2017-11-01 – 2017-11-02 (×2): 20 mg via ORAL
  Filled 2017-10-31 (×2): qty 1

## 2017-10-31 MED ORDER — GABAPENTIN 100 MG PO CAPS
100.0000 mg | ORAL_CAPSULE | Freq: Three times a day (TID) | ORAL | Status: DC
  Administered 2017-11-01 (×2): 100 mg via ORAL
  Filled 2017-10-31 (×5): qty 1

## 2017-10-31 MED ORDER — ATORVASTATIN CALCIUM 80 MG PO TABS
80.0000 mg | ORAL_TABLET | Freq: Every day | ORAL | Status: DC
  Administered 2017-10-31 – 2017-11-01 (×2): 80 mg via ORAL
  Filled 2017-10-31 (×2): qty 1

## 2017-10-31 MED ORDER — METHYLPREDNISOLONE SODIUM SUCC 125 MG IJ SOLR
125.0000 mg | Freq: Once | INTRAMUSCULAR | Status: AC
  Administered 2017-10-31: 125 mg via INTRAVENOUS
  Filled 2017-10-31: qty 2

## 2017-10-31 MED ORDER — TIOTROPIUM BROMIDE MONOHYDRATE 18 MCG IN CAPS: 18.0000 ug | ORAL_CAPSULE | Freq: Every day | RESPIRATORY_TRACT | Status: DC

## 2017-10-31 MED ORDER — LOSARTAN POTASSIUM 50 MG PO TABS
50.0000 mg | ORAL_TABLET | Freq: Every day | ORAL | Status: DC
  Administered 2017-11-01 – 2017-11-02 (×2): 50 mg via ORAL
  Filled 2017-10-31 (×2): qty 1

## 2017-10-31 MED ORDER — IPRATROPIUM-ALBUTEROL 0.5-2.5 (3) MG/3ML IN SOLN
3.0000 mL | Freq: Once | RESPIRATORY_TRACT | Status: AC
  Administered 2017-10-31: 3 mL via RESPIRATORY_TRACT
  Filled 2017-10-31: qty 3

## 2017-10-31 MED ORDER — IPRATROPIUM-ALBUTEROL 0.5-2.5 (3) MG/3ML IN SOLN
3.0000 mL | Freq: Four times a day (QID) | RESPIRATORY_TRACT | Status: DC
  Administered 2017-10-31 – 2017-11-01 (×3): 3 mL via RESPIRATORY_TRACT
  Filled 2017-10-31 (×3): qty 3

## 2017-10-31 NOTE — ED Notes (Signed)
Pt called out requesting to go home and that she'll take her albuterol when she gets home. RN notified.

## 2017-10-31 NOTE — ED Provider Notes (Signed)
[facility] EMERGENCY DEPARTMENT Provider Note   CSN: [id number] Arrival date & time: 10/31/17  1014     History   Chief Complaint Chief Complaint  Patient presents with  . Fatigue  . COPD    HPI Judith Scott is a 62 y.o. female.  HPI   Pt is a 62 y/o female with a h/o COPD, HLD, HTN who presents to the ED today c/o SOB that began 6 days ago.  Reports associated chills, productive cough (chronic, baseline), rhinorrhea, congestion, bilat flank pain. Reports BLE swelling that has been present for the last 8-9 months, baseline. No calf pain. Denies fevers, chest pain, pain with inspiration, urinary sxs, abd pain, NVD. Has been taking symbicort and albuterol neb tx at home with no relief. Wears 3L at home chronically.  Reports sick contacts with similar sxs.  Past Medical History:  Diagnosis Date  . COPD (chronic obstructive pulmonary disease) (HCC)   . COPD exacerbation (HCC) 05/25/2012  . Hypercholesteremia   . Hypertension     Patient Active Problem List   Diagnosis Date Noted  . COPD (chronic obstructive pulmonary disease) (HCC) 10/31/2017  . Cough 05/28/2017  . Leg swelling 05/28/2017  . Diarrhea 05/28/2017  . Current chronic use of systemic steroids 01/26/2017  . Bilateral leg edema 01/26/2017  . Rheumatoid arthritis (HCC) 01/20/2017  . Healthcare maintenance 07/22/2016  . Sciatic nerve pain, right 05/17/2016  . Dyspnea on exertion 02/09/2016  . Wrist arthralgia 09/02/2015  . Cigarette smoker 12/21/2013  . COPD GOLD III  10/23/2013  . Lung nodule 10/23/2013  . Hyperlipidemia 10/13/2013  . Hyponatremia 10/13/2013  . Dehydration 10/13/2013  . Essential hypertension, benign 02/15/2013  . COPD exacerbation (HCC) 05/25/2012  . Tobacco abuse 05/25/2012  . Chronic respiratory failure with hypoxia (HCC) 05/25/2012    Past Surgical History:  Procedure Laterality Date  . APPENDECTOMY       OB History   None      Home Medications    Prior  to Admission medications   Medication Sig Start Date End Date Taking? Authorizing Provider  albuterol (PROVENTIL HFA;VENTOLIN HFA) 108 (90 Base) MCG/ACT inhaler Inhale 2 puffs into the lungs every 4 (four) hours as needed for wheezing or shortness of breath. 03/06/17  Yes Riccio, Angela C, DO  albuterol (PROVENTIL) (2.5 MG/3ML) 0.083% nebulizer solution Take 3 mLs (2.5 mg total) by nebulization every 6 (six) hours as needed for wheezing or shortness of breath. 05/25/17  Yes Casey Burkitt, MD  atorvastatin (LIPITOR) 80 MG tablet TAKE 1 TABLET (80 MG TOTAL) BY MOUTH DAILY AT 6 PM. Patient taking differently: Take 80 mg by mouth daily.  08/14/17  Yes Riccio, Angela C, DO  etanercept (ENBREL) 25 MG injection Inject 25 mg into the skin once a week.   Yes [provider]  leflunomide (ARAVA) 20 MG tablet Take 20 mg by mouth daily. 02/01/16  Yes [provider]  losartan (COZAAR) 25 MG tablet Take 50 mg by mouth daily. 09/22/17  Yes [provider]  OXYGEN 3lpm with sleep and as needed during the day   Yes [provider]  SYMBICORT 160-4.5 MCG/ACT inhaler TAKE 2 PUFFS BY MOUTH TWICE A DAY Patient taking differently: Inhale 2 puffs into the lungs 2 (two) times daily.  06/16/17  Yes Riccio, Angela C, DO  tiotropium (SPIRIVA HANDIHALER) 18 MCG inhalation capsule Place 1 capsule (18 mcg total) into inhaler and inhale daily. 04/25/17  Yes Dolores Patty C, DO  benzonatate (  TESSALON) 100 MG capsule Take 1 capsule (100 mg total) by mouth 2 (two) times daily as needed for cough. Patient not taking: Reported on 10/31/2017 12/21/16   Casey Burkitt, MD  furosemide (LASIX) 20 MG tablet Take 1 tablet (20 mg total) by mouth daily as needed for edema. Patient not taking: Reported on 10/31/2017 01/19/17   Almon Hercules, MD  gabapentin (NEURONTIN) 100 MG capsule Take 1 capsule (100 mg total) by mouth 3 (three) times daily. Patient not taking: Reported on 10/31/2017 07/22/16    Tillman Sers, DO  guaiFENesin-dextromethorphan (ROBITUSSIN DM) 100-10 MG/5ML syrup Take 5 mLs by mouth every 4 (four) hours as needed for cough. Patient not taking: Reported on 10/31/2017 01/13/17   Leatha Gilding, MD  levofloxacin (LEVAQUIN) 750 MG tablet Take 1 tablet (750 mg total) by mouth daily. Patient not taking: Reported on 10/31/2017 05/25/17   Casey Burkitt, MD  losartan (COZAAR) 50 MG tablet TAKE 1 TABLET BY MOUTH EVERY DAY Patient not taking: Reported on 10/31/2017 06/27/17   Tillman Sers, DO  nicotine (NICODERM CQ - DOSED IN MG/24 HOURS) 14 mg/24hr patch Place 1 patch (14 mg total) onto the skin daily. Patient not taking: Reported on 10/31/2017 01/13/17   Leatha Gilding, MD  predniSONE (DELTASONE) 10 MG tablet Take 1 tablet (10 mg total) by mouth daily. 4 tablets daily x 4 days then 3 daily x 4 days then 2 daily x 4 days then 1 daily x 4 days Patient not taking: Reported on 10/31/2017 01/13/17   Leatha Gilding, MD  predniSONE (DELTASONE) 20 MG tablet Take 2 tablets (40 mg total) by mouth daily with breakfast. Patient not taking: Reported on 10/31/2017 05/25/17   Casey Burkitt, MD    Family History Family History  Problem Relation Age of Onset  . 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 Smoking status: Current Every Day Smoker    Packs/day: 1.50    Years: 40.00    Pack years: 60.00    Types: Cigarettes  . Smokeless tobacco: Never Used  Substance Use Topics  . Alcohol use: Yes    Alcohol/week: 0.0 standard drinks    Comment: occasional  . Drug use: No     Allergies   Patient has no known allergies.   Review of Systems Review of Systems  Constitutional: Positive for chills. Negative for fever.  HENT: Positive for congestion and rhinorrhea. Negative for ear pain and sore throat.   Eyes: Negative for visual disturbance.  Respiratory: Positive for cough  and shortness of breath. Negative for wheezing.   Cardiovascular: Positive for leg swelling (chronic, baseline). Negative for chest pain and palpitations.  Gastrointestinal: Negative for abdominal pain, constipation, diarrhea, nausea and vomiting.  Genitourinary: Positive for flank pain. Negative for dysuria, frequency, hematuria and urgency.  Musculoskeletal: Negative for back pain and neck pain.  Skin: Negative for color change and rash.  Neurological: Negative for seizures, syncope and headaches.  All other systems reviewed and are negative.  Physical Exam Updated Vital Signs BP (!) 157/94   Pulse 91   Temp 99 F (37.2 C) (Oral)   Resp (!) 22   SpO2 93%   Physical Exam  Constitutional: No distress.  Chronically ill appearing  HENT:  Head: Normocephalic and atraumatic.  Mucous membranes dry  Eyes: Conjunctivae are normal.  Neck: Neck supple.  Cardiovascular: Normal rate, regular rhythm, normal heart sounds and intact distal pulses.  No murmur heard. Pulmonary/Chest: Effort normal. No respiratory distress.  Decreased breath sounds throughout, ronchi to RLL, faint crackles to bilat lower lobes, expiratory wheezing anteriorly, speaking in full sentences  Abdominal: Soft. Bowel sounds are normal. She exhibits no distension. There is no tenderness. There is no guarding.  No significant CVA TTP bilat  Musculoskeletal:  1+ pitting edema to BLE up to the knees, no calf TTP  Neurological: She is alert.  Skin: Skin is warm and dry. Capillary refill takes 2 to 3 seconds.  Psychiatric: She has a normal mood and affect.  Nursing note and vitals reviewed.    ED Treatments / Results  Labs (all labs ordered are listed, but only abnormal results are displayed) Labs Reviewed  BASIC METABOLIC PANEL - Abnormal; Notable for the following components:      Result Value   Potassium 3.4 (*)    Chloride 86 (*)    CO2 43 (*)    Glucose, Bld 114 (*)    BUN 5 (*)    Creatinine, Ser 0.32 (*)     Calcium 8.8 (*)    All other components within normal limits  CBC - Abnormal; Notable for the following components:   WBC 11.3 (*)    Hemoglobin 11.1 (*)    MCHC 28.7 (*)    Platelets 470 (*)    All other components within normal limits  BRAIN NATRIURETIC PEPTIDE - Abnormal; Notable for the following components:   B Natriuretic Peptide 362.7 (*)    All other components within normal limits  URINALYSIS, ROUTINE W REFLEX MICROSCOPIC - Abnormal; Notable for the following components:   Color, Urine COLORLESS (*)    Specific Gravity, Urine 1.004 (*)    Hgb urine dipstick SMALL (*)    Protein, ur 30 (*)    All other components within normal limits  CBC WITH DIFFERENTIAL/PLATELET - Abnormal; Notable for the following components:   WBC 10.8 (*)    Hemoglobin 11.3 (*)    MCHC 29.2 (*)    Platelets 459 (*)    Neutro Abs 8.1 (*)    Monocytes Absolute 1.5 (*)    All other components within normal limits  MAGNESIUM - Abnormal; Notable for the following components:   Magnesium 1.5 (*)    All other components within normal limits  I-STAT ARTERIAL BLOOD GAS, ED - Abnormal; Notable for the following components:   pCO2 arterial 86.3 (*)    pO2, Arterial 78.0 (*)    Bicarbonate 52.6 (*)    TCO2 >50 (*)    Acid-Base Excess 22.0 (*)    All other components within normal limits  RAPID URINE DRUG SCREEN, HOSP PERFORMED  BLOOD GAS, ARTERIAL  I-STAT TROPONIN, ED    EKG EKG Interpretation  Date/Time:  Tuesday October 31 2017 13:31:09 EDT Ventricular Rate:  99 PR Interval:    QRS Duration: 77 QT Interval:  353 QTC Calculation: 453 R Axis:   103 Text Interpretation:  Sinus rhythm Biatrial enlargement Right axis deviation Consider left ventricular hypertrophy Baseline wander in lead(s) V4 When comapred to prior, no significant changes seen.  No STEMI Confirmed by Theda Belfast ([id number]) on 10/31/2017 1:43:14 PM   Radiology Dg Chest 2 View  Result Date: 10/31/2017 CLINICAL DATA:   Shortness of breath.  History of COPD. EXAM: CHEST - 2 VIEW COMPARISON:  05/09/2017.  01/10/2017. FINDINGS: Mediastinum and hilar structures are normal. Mild left base atelectasis/infiltrate and  small left pleural effusion. No pneumothorax. Heart size normal. Small left pleural effusion. No acute bony abnormality. IMPRESSION: Mild left base atelectasis/infiltrate and small left pleural effusion. Electronically Signed   By: Maisie Fus  Register   On: 10/31/2017 13:04    Procedures Procedures (including critical care time)  Medications Ordered in ED Medications  ipratropium-albuterol (DUONEB) 0.5-2.5 (3) MG/3ML nebulizer solution 3 mL (3 mLs Nebulization Given 10/31/17 1307)  methylPREDNISolone sodium succinate (SOLU-MEDROL) 125 mg/2 mL injection 125 mg (125 mg Intravenous Given 10/31/17 1307)  cefTRIAXone (ROCEPHIN) 1 g in sodium chloride 0.9 % 100 mL IVPB (0 g Intravenous Stopped 10/31/17 1519)  azithromycin (ZITHROMAX) 500 mg in sodium chloride 0.9 % 250 mL IVPB (0 mg Intravenous Stopped 10/31/17 1732)  furosemide (LASIX) injection 20 mg (20 mg Intravenous Given 10/31/17 1449)  potassium chloride (K-DUR,KLOR-CON) CR tablet 30 mEq (30 mEq Oral Given 10/31/17 1448)     Initial Impression / Assessment and Plan / ED Course  I have reviewed the triage vital signs and the nursing notes.  Pertinent labs & imaging results that were available during my care of the patient were reviewed by me and considered in my medical decision making (see chart for details).     Final Clinical Impressions(s) / ED Diagnoses   Final diagnoses:  Community acquired pneumonia of left lower lobe of lung (HCC)  Acute on chronic congestive heart failure, unspecified heart failure type (HCC)  COPD exacerbation (HCC)   Patient with shortness of breath for the last 5 to 6 days.  Also complaining of lower extremity swelling and productive cough.  No fevers or chills.  No chest pain.  History of COPD with increased oxygen  requirement today.  No fevers.  Normal heart rate and blood pressure.  CBC with mild leukocytosis to 11. Mild anemia to 11 which is new.  BMP elevated CO2 at 43, slightly increased from previous of 40. BNP elevated at 362, increased from 145 nine months ago. Trop WNL CXR with left base infiltrate versus atelectasis. Also with left pleural effusion.  EKG with NSR. Biatrial enlargement. RAD. LVH.  Baseline wander in lead(s) V4. No significant changes seen.    We will plan for admission for acute on chronic hypoxic respiratory failure in setting of possible left-sided pneumonia versus COPD exacerbation.  Antibiotics given for community-acquired pneumonia and solumedrol given for COPD exac. Also with elevated BNP and suspected mild CHF exacerbation. No pulmonary edema, but peripheral LE swelling. One dose of Lasix given in ED.  2:54 PM CONSULT With Dr. Henrene Hawking with family medicine service who accepts pt for admission.   ED Discharge Orders    None       Rayne Du 10/31/17 1819    Tegeler, Canary Brim, MD 10/31/17 [phone]

## 2017-10-31 NOTE — ED Notes (Signed)
Patient transported to X-ray 

## 2017-10-31 NOTE — H&P (Signed)
Family [facility] and Physical Service Pager: [phone]  Patient name: Judith Scott Medical record number: [id number] Date of birth: 06/07/1955 Age: 62 y.o. Gender: female  Primary Care Provider: Tillman Sers, DO Consultants: none Code Status: full  Chief Complaint: shortness of breath  Assessment and Plan: Judith Scott is a 62 y.o. female presenting with shortness of breath . PMH is significant for COPD, HTN, Tobacco Abuse, HLD, Rheumatoid Arthritis.  Acute Hypoxic Respiratory Failure Likely secondary to COPD exacerbation.  She is chronically on 3 L  O2 per nasal cannula at home continuously.  She continues to smoke 1 pack/day.  Has had worsening shortness of breath at rest for 4 days.  Denies cough, fevers.  Has been requiring frequent use of PRN albuterol at home.  Patient noted to have desaturation to 83% while on home 3 L during ambulation in ED.  Improved with increasing to 5 L.  She was given 1 dose IV Lasix 20mg .  Last Echo EF 60-65% 03/2016, does not have history of heart failure.  Chest x-ray noted small pulmonary effusion on the left, legs were noted to be edematous in the ED.  On exam at admission, patient noted to have no edema of the bilateral lower extremities.  She has poor air movement throughout her lungs with end expiratory wheezing in the upper lung fields bilaterally.  BNP noted to be 362.7, was 145.4 9 months ago.  Given this presentation and very small pleural effusion noted on chest x-ray, suspect that CHF is not the primary cause of patient's shortness of breath.  Will opt to not continue Lasix at this time as she appears euvolemic.  Her symptoms at present are more consistent with a COPD exacerbation.  CXR also noted mild left base atelectasis versus infiltrate.  Given the patient's overall nonseptic appearing with stable vital signs and white blood cell count of 10.8, with ANC 8.1, doubt pneumonia.  Antibiotics will be initiated  for treatment of COPD and will cover for community-acquired pneumonia.  She received 1 dose of Rocephin and azithromycin in the ED, IV methylprednisolone, DuoNeb x1.  Patient also denies chest pain, troponin 0.01, EKG without acute changes from previous, and has a Wells score of 0, therefore no suspicion for PE or ACS. -Admit to MedSurg, Dr. Manson Passey service -Monitor vitals per floor protocol -Continuous pulse ox -O2 supplementation to keep O2 sats 88 to 92%, home O2 requirement 3 L -Strict I/O -Daily weights -Monitor fluid status, reassess need for Lasix -Continue prednisone for 5-day course -Continue CTX for 5-day course -Scheduled duo nebs -Albuterol as needed -Continue home Spiriva -Dulera twice daily while inpatient, given formulary -CMP, CBC in a.m. -Urinalysis -Lovenox for DVT prophylaxis -Heart healthy diet  COPD Likely the cause of patient's shortness of breath at present.  Uses albuterol as needed, Symbicort twice daily, Spiriva daily.  Continues to smoke 1 pack/day. -Consider discharging on Trelegy for simplify medication management of COPD -Plan per above  Normocytic anemia Hemoglobin 11.3 with MCV of 97.7 on admission.  Hemoglobin in January noted to be 13.5.  Patient denies any current hematochezia, hematuria, hemoptysis.  She also denies melena.  Vital signs currently stable. -Continue to monitor CBC -Anemia panel  Hypokalemia K 3.4 on admission.  Repleted in the ED with 30 mEq of K-Dur. -Will recheck K in a.m. -Check magnesium   HTN On losartan at home.  BP on admission 149/79. -Continue home losartan  Tobacco abuse Smokes 1 pack/day.  Discussed with  patient the need for smoking cessation.  She agrees that she would like to start and is interested in nicotine patches. -Nicotine patch while inpatient continue as continue outpatient -Continue to encourage smoking cessation as outpatient  HLD Atorvastatin 80mg  QD at home. -Continue atorvastatin  Rheumatoid  Arthritis Take etanercept once weekly and leflunomide 20 mg daily. -Continue leflunomide while inpatient  Sciatic nerve pain on right Takes gabapentin as needed with good relief. -Continue home gabapentin  FEN/GI: Heart healthy Prophylaxis: Lovenox  Disposition: Admit to MedSurg  History of Present Illness:  Judith Scott is a 62 y.o. female presenting with 4-day history of progressive shortness of breath.  She states that she started "feeling sick on 10/25.  She noted that she was not breathing well.  She uses oxygen 3 L chronically at home.  She states that she is usually short of breath with ambulation, but was starting to notice that she was getting short of breath while at rest.  She "doctored herself" all weekend by using albuterol frequently.  She states that it was not helpful.  Following her nebulizer treatments she was coughing up some beige, green sputum but denied any blood.  She denied any fevers or chills at home.  She states that she did check her temperature multiple times and that it was never above 100.  She also noted some bilateral lumbar back pain around the onset of symptoms, but has resolved.  She has chronic bilateral lower extremity swelling, that she notes is unchanged.  She denies any chest pain.  She states that she is chronically dizzy upon standing.  In the ED patient was noted to be tachypneic to mid 20s, and was noted to have desaturations to 83% on home 3 L while ambulating.  She was given 1 dose IV methylprednisolone, duo nebs x1, 1 dose of Rocephin, 1 dose of azithromycin.  She currently endorses shortness of breath at rest, but is resting comfortably.    Review Of Systems: Per HPI with the following additions:    Review of Systems  Constitutional: Negative for chills and fever.  HENT: Negative for congestion.   Eyes: Negative for blurred vision.  Respiratory: Positive for sputum production and shortness of breath. Negative for cough and hemoptysis.    Cardiovascular: Positive for leg swelling. Negative for chest pain.  Gastrointestinal: Negative for abdominal pain, blood in stool, constipation, diarrhea, melena, nausea and vomiting.  Genitourinary: Negative for dysuria, frequency and hematuria.  Musculoskeletal: Positive for back pain.  Neurological: Positive for dizziness. Negative for headaches.    Patient Active Problem List   Diagnosis Date Noted  . Cough 05/28/2017  . Leg swelling 05/28/2017  . Diarrhea 05/28/2017  . Current chronic use of systemic steroids 01/26/2017  . Bilateral leg edema 01/26/2017  . Rheumatoid arthritis (HCC) 01/20/2017  . Healthcare maintenance 07/22/2016  . Sciatic nerve pain, right 05/17/2016  . Dyspnea on exertion 02/09/2016  . Wrist arthralgia 09/02/2015  . Cigarette smoker 12/21/2013  . COPD GOLD III  10/23/2013  . Lung nodule 10/23/2013  . Hyperlipidemia 10/13/2013  . Hyponatremia 10/13/2013  . Dehydration 10/13/2013  . Essential hypertension, benign 02/15/2013  . COPD exacerbation (HCC) 05/25/2012  . Tobacco abuse 05/25/2012  . Chronic respiratory failure with hypoxia (HCC) 05/25/2012    Past Medical History: Past Medical History:  Diagnosis Date  . COPD (chronic obstructive pulmonary disease) (HCC)   . COPD exacerbation (HCC) 05/25/2012  . Hypercholesteremia   . Hypertension     Past Surgical History: Past  Surgical History:  Procedure Laterality Date  . APPENDECTOMY      Social History: Social History   Tobacco Use  . Smoking status: Current Every Day Smoker    Packs/day: 1.50    Years: 40.00    Pack years: 60.00    Types: Cigarettes  . Smokeless tobacco: Never Used  Substance Use Topics  . Alcohol use: Yes    Alcohol/week: 0.0 standard drinks    Comment: occasional  . Drug use: No   Additional social history:  Please also refer to relevant sections of EMR.  Family History: Family History  Problem Relation Age of Onset  . 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    Current Outpatient Medications on File Prior to Encounter  Medication Sig Dispense Refill  . SYMBICORT 160-4.5 MCG/ACT inhaler TAKE 2 PUFFS BY MOUTH TWICE A DAY 10.2 Inhaler 5  . tiotropium (SPIRIVA HANDIHALER) 18 MCG inhalation capsule Place 1 capsule (18 mcg total) into inhaler and inhale daily. 30 capsule 12  . albuterol (PROVENTIL HFA;VENTOLIN HFA) 108 (90 Base) MCG/ACT inhaler Inhale 2 puffs into the lungs every 4 (four) hours as needed for wheezing or shortness of breath. 1 Inhaler 11  . albuterol (PROVENTIL) (2.5 MG/3ML) 0.083% nebulizer solution Take 3 mLs (2.5 mg total) by nebulization every 6 (six) hours as needed for wheezing or shortness of breath. 75 mL 12  . atorvastatin (LIPITOR) 80 MG tablet TAKE 1 TABLET (80 MG TOTAL) BY MOUTH DAILY AT 6 PM. 90 tablet 3  . benzonatate (TESSALON) 100 MG capsule Take 1 capsule (100 mg total) by mouth 2 (two) times daily as needed for cough. 20 capsule 0  . etanercept (ENBREL) 25 MG injection Inject 25 mg into the skin once a week.    . furosemide (LASIX) 20 MG tablet Take 1 tablet (20 mg total) by mouth daily as needed for edema. 30 tablet 3  . gabapentin (NEURONTIN) 100 MG capsule Take 1 capsule (100 mg total) by mouth 3 (three) times daily. 30 capsule 3  . guaiFENesin-dextromethorphan (ROBITUSSIN DM) 100-10 MG/5ML syrup Take 5 mLs by mouth every 4 (four) hours as needed for cough. 118 mL 0  . leflunomide (ARAVA) 20 MG tablet Take 20 mg by mouth daily.  2  . levofloxacin (LEVAQUIN) 750 MG tablet Take 1 tablet (750 mg total) by mouth daily. 7 tablet 0  . losartan (COZAAR) 25 MG tablet Take 50 mg by mouth daily.  2  . losartan (COZAAR) 50 MG tablet TAKE 1 TABLET BY MOUTH EVERY DAY 90 tablet 2  . nicotine (NICODERM CQ - DOSED IN MG/24 HOURS) 14 mg/24hr patch Place 1 patch (14 mg total) onto the  skin daily. 28 patch 0  . OXYGEN 3lpm with sleep and as needed during the day    . predniSONE (DELTASONE) 10 MG tablet Take 1 tablet (10 mg total) by mouth daily. 4 tablets daily x 4 days then 3 daily x 4 days then 2 daily x 4 days then 1 daily x 4 days 40 tablet 0  . predniSONE (DELTASONE) 2.5 MG tablet Take 5 mg by mouth daily with breakfast.    . predniSONE (DELTASONE) 20 MG tablet Take 2 tablets (40 mg total) by mouth daily with breakfast. 10 tablet 0    Objective: BP (!) 148/79   Pulse 92  Temp 99 F (37.2 C) (Oral)   Resp (!) 26   SpO2 93%   Physical Exam: General: 62 y.o. female in NAD HEENT: NCAT, MMM, oropharynx clear Cardio: RRR no m/r/g Lungs: End expiratory wheezing in upper lung fields, poor air movement throughout, diminished lung sounds in bilateral mid to lower lungs, no increased work of breathing, on 3 L per nasal cannula Abdomen: Soft, non-tender to palpation, positive bowel sounds Skin: warm and dry Extremities: No edema Neuro: A&Ox3, grossly intact Psych: Mood and affect appropriate for circumstance, cooperative with exam   Labs and Imaging: CBC BMET  Recent Labs  Lab 10/31/17 1049  WBC 10.8*  11.3*  HGB 11.3*  11.1*  HCT 38.7  38.7  PLT 459*  470*   Recent Labs  Lab 10/31/17 1049  NA 139  K 3.4*  CL 86*  CO2 43*  BUN 5*  CREATININE 0.32*  GLUCOSE 114*  CALCIUM 8.8*     Dg Chest 2 View  Result Date: 10/31/2017 CLINICAL DATA:  Shortness of breath.  History of COPD. EXAM: CHEST - 2 VIEW COMPARISON:  05/09/2017.  01/10/2017. FINDINGS: Mediastinum and hilar structures are normal. Mild left base atelectasis/infiltrate and small left pleural effusion. No pneumothorax. Heart size normal. Small left pleural effusion. No acute bony abnormality. IMPRESSION: Mild left base atelectasis/infiltrate and small left pleural effusion. Electronically Signed   By: Maisie Fus  Register   On: 10/31/2017 13:04    Meccariello, Solmon Ice, DO 10/31/2017, 3:11  PM PGY-1, [facility] Family Medicine FPTS Intern pager: [phone], text pages welcome

## 2017-10-31 NOTE — ED Notes (Signed)
Family Medicine paged to [facility] to [phone].

## 2017-10-31 NOTE — ED Notes (Signed)
Pt to be transported to the same bed that report was given.  Bed control was notified as well at the 2W unit.

## 2017-10-31 NOTE — Progress Notes (Signed)
FPTS Interim Progress Note  S: presented to patient room ~2040 to evaluate patient mental and respiratory status. Patient was alert and sitting in bed on 3L O2 satting 92%. She did not appear to be in any apparent distress. She was able to converse normally without increased WOB. Her biggest complaint is that she is hungry. She denies offer for BiPAP. She states that she would like a nicotine patch. She smokes 1.5-2 packs cigarettes per day.   O: BP (!) 161/86 (BP Location: Right Arm)   Pulse 92   Temp 98.4 F (36.9 C) (Oral)   Resp (!) 23   Ht 5\' 4"  (1.626 m)   Wt 50.4 kg   SpO2 92%   BMI 19.07 kg/m    General: no acute distress Pulm: diminished lung sounds bilateral bases. Mild inspiratory wheezes on inspiration on right mid/upper lobe. No accessory muscle use.  A/P: Patient is on home oxygen requirement of 3L Continue to monitor patient's respiratory and mental status Patient refused BiPAP but we discussed that if she worsens we will start the BiPAP and she was agreeable Added normal diet on Added nicotine patch Counseled patient on smoking cessation  , DO 10/31/2017, 8:57 PM PGY-1, [facility] Family Medicine Service pager [phone]

## 2017-10-31 NOTE — ED Notes (Addendum)
Pt ambulated to bathroom and back on PulseOx. Despite being on 3L of O2, Pt's O2 dropped as low as 83 while ambulating. This tech increased O2 to 5L, which raised O2 to 89%. Pt returned to room and O2 was set back to 3L, but O2 dropped to 84%. O2 is now at 6L with Pt resting in bed at 92%. Pt stated she wanted to leave if she didn't get food soon.

## 2017-10-31 NOTE — ED Notes (Signed)
Bed has changed to Step-Down due to patients oxygen requirement.

## 2017-10-31 NOTE — ED Notes (Signed)
UA and culture sent. 

## 2017-10-31 NOTE — ED Triage Notes (Signed)
Patient presents to the ED by EMS with c/o COPD,weakness, fatigue, and loss of appetite since Friday. Increased SOB. Increased home oxygen to 4 sats in the low 90's which is her norm. NSR on EKG.

## 2017-10-31 NOTE — ED Notes (Signed)
Report attempted x1 Charge is assigning the bed.

## 2017-10-31 NOTE — ED Notes (Signed)
PureWick placed on pt. 

## 2017-11-01 DIAGNOSIS — E43 Unspecified severe protein-calorie malnutrition: Secondary | ICD-10-CM

## 2017-11-01 DIAGNOSIS — J441 Chronic obstructive pulmonary disease with (acute) exacerbation: Principal | ICD-10-CM

## 2017-11-01 DIAGNOSIS — J189 Pneumonia, unspecified organism: Secondary | ICD-10-CM

## 2017-11-01 DIAGNOSIS — L8990 Pressure ulcer of unspecified site, unspecified stage: Secondary | ICD-10-CM

## 2017-11-01 DIAGNOSIS — I509 Heart failure, unspecified: Secondary | ICD-10-CM

## 2017-11-01 DIAGNOSIS — J181 Lobar pneumonia, unspecified organism: Secondary | ICD-10-CM

## 2017-11-01 LAB — RETICULOCYTES
Immature Retic Fract: 9.4 % (ref 2.3–15.9)
RBC.: 4.06 MIL/uL (ref 3.87–5.11)
RETIC COUNT ABSOLUTE: 45.9 10*3/uL (ref 19.0–186.0)
Retic Ct Pct: 1.1 % (ref 0.4–3.1)

## 2017-11-01 LAB — COMPREHENSIVE METABOLIC PANEL
ALBUMIN: 2.3 g/dL — AB (ref 3.5–5.0)
ALT: 9 U/L (ref 0–44)
ANION GAP: 8 (ref 5–15)
AST: 26 U/L (ref 15–41)
Alkaline Phosphatase: 70 U/L (ref 38–126)
BUN: 6 mg/dL — ABNORMAL LOW (ref 8–23)
CHLORIDE: 84 mmol/L — AB (ref 98–111)
CO2: 47 mmol/L — AB (ref 22–32)
Calcium: 8.5 mg/dL — ABNORMAL LOW (ref 8.9–10.3)
Creatinine, Ser: 0.49 mg/dL (ref 0.44–1.00)
GFR calc non Af Amer: >60 mL/min (ref >60)
GLUCOSE: 125 mg/dL — AB (ref 70–99)
Potassium: 3.6 mmol/L (ref 3.5–5.1)
SODIUM: 139 mmol/L (ref 135–145)
Total Bilirubin: 0.3 mg/dL (ref 0.3–1.2)
Total Protein: 6.2 g/dL — ABNORMAL LOW (ref 6.5–8.1)

## 2017-11-01 LAB — CBC WITH DIFFERENTIAL/PLATELET
Basophils Absolute: 0 10*3/uL (ref 0.0–0.1)
Basophils Relative: 0 %
Eosinophils Absolute: 0 10*3/uL (ref 0.0–0.5)
Eosinophils Relative: 0 %
HEMATOCRIT: 38.5 % (ref 36.0–46.0)
HEMOGLOBIN: 11.5 g/dL — AB (ref 12.0–15.0)
LYMPHS ABS: 0.5 10*3/uL — AB (ref 0.7–4.0)
LYMPHS PCT: 10 %
MCH: 28.3 pg (ref 26.0–34.0)
MCHC: 29.9 g/dL — ABNORMAL LOW (ref 30.0–36.0)
MCV: 94.8 fL (ref 80.0–100.0)
Monocytes Absolute: 0.2 10*3/uL (ref 0.1–1.0)
Monocytes Relative: 3 %
NEUTROS ABS: 4.7 10*3/uL (ref 1.7–7.7)
NEUTROS PCT: 87 %
NRBC: 0 % (ref 0.0–0.2)
NRBC: 1 /100{WBCs} — AB
Platelets: 413 10*3/uL — ABNORMAL HIGH (ref 150–400)
RBC: 4.06 MIL/uL (ref 3.87–5.11)
RDW: 13.5 % (ref 11.5–15.5)
WBC: 5.4 10*3/uL (ref 4.0–10.5)

## 2017-11-01 LAB — FERRITIN: Ferritin: 87 ng/mL (ref 11–307)

## 2017-11-01 LAB — IRON AND TIBC
IRON: 16 ug/dL — AB (ref 28–170)
SATURATION RATIOS: 6 % — AB (ref 10.4–31.8)
TIBC: 265 ug/dL (ref 250–450)
UIBC: 249 ug/dL

## 2017-11-01 LAB — MRSA PCR SCREENING: MRSA BY PCR: NEGATIVE

## 2017-11-01 LAB — VITAMIN B12: Vitamin B-12: 535 pg/mL (ref 180–914)

## 2017-11-01 LAB — BLOOD GAS, ARTERIAL
ACID-BASE EXCESS: 22.3 mmol/L — AB (ref 0.0–2.0)
Bicarbonate: 48.8 mmol/L — ABNORMAL HIGH (ref 20.0–28.0)
DRAWN BY: 42624
O2 Content: 5 L/min
O2 SAT: 92 %
Patient temperature: 98.6
pCO2 arterial: 83.5 mmHg (ref 32.0–48.0)
pH, Arterial: 7.385 (ref 7.350–7.450)
pO2, Arterial: 70.7 mmHg — ABNORMAL LOW (ref 83.0–108.0)

## 2017-11-01 LAB — FOLATE: FOLATE: 7.5 ng/mL (ref >5.9)

## 2017-11-01 LAB — HIV ANTIBODY (ROUTINE TESTING W REFLEX): HIV SCREEN 4TH GENERATION: NONREACTIVE

## 2017-11-01 MED ORDER — CEFDINIR 300 MG PO CAPS
300.0000 mg | ORAL_CAPSULE | Freq: Two times a day (BID) | ORAL | Status: DC
  Administered 2017-11-01 – 2017-11-02 (×3): 300 mg via ORAL
  Filled 2017-11-01 (×4): qty 1

## 2017-11-01 MED ORDER — BOOST PLUS PO LIQD
237.0000 mL | Freq: Three times a day (TID) | ORAL | Status: DC
  Administered 2017-11-02: 237 mL via ORAL
  Filled 2017-11-01 (×4): qty 237

## 2017-11-01 MED ORDER — ACETAMINOPHEN 325 MG PO TABS
650.0000 mg | ORAL_TABLET | Freq: Four times a day (QID) | ORAL | Status: DC | PRN
  Administered 2017-11-01 (×2): 650 mg via ORAL
  Filled 2017-11-01 (×2): qty 2

## 2017-11-01 MED ORDER — MAGNESIUM SULFATE 50 % IJ SOLN: 1.0000 g | Freq: Once | INTRAMUSCULAR | Status: DC

## 2017-11-01 MED ORDER — CEFDINIR 300 MG PO CAPS
300.0000 mg | ORAL_CAPSULE | Freq: Two times a day (BID) | ORAL | Status: DC
  Filled 2017-11-01: qty 1

## 2017-11-01 MED ORDER — PREDNISONE 20 MG PO TABS
40.0000 mg | ORAL_TABLET | Freq: Every day | ORAL | Status: DC
  Administered 2017-11-01 – 2017-11-02 (×2): 40 mg via ORAL
  Filled 2017-11-01 (×2): qty 2

## 2017-11-01 MED ORDER — MAGNESIUM SULFATE IN D5W 1-5 GM/100ML-% IV SOLN
1.0000 g | Freq: Once | INTRAVENOUS | Status: AC
  Administered 2017-11-01: 1 g via INTRAVENOUS
  Filled 2017-11-01: qty 100

## 2017-11-01 MED ORDER — IPRATROPIUM-ALBUTEROL 0.5-2.5 (3) MG/3ML IN SOLN
3.0000 mL | Freq: Three times a day (TID) | RESPIRATORY_TRACT | Status: DC
  Administered 2017-11-01 – 2017-11-02 (×4): 3 mL via RESPIRATORY_TRACT
  Filled 2017-11-01 (×3): qty 3

## 2017-11-01 NOTE — Progress Notes (Addendum)
Initial Nutrition Assessment  DOCUMENTATION CODES:   Severe malnutrition in context of chronic illness  INTERVENTION:    Boost Plus po TID, each supplement provides 360 kcal and 14 grams of protein  NUTRITION DIAGNOSIS:   Severe Malnutrition related to chronic illness(COPD) as evidenced by severe fat depletion, severe muscle depletion and severe weight loss of 15% x 5 months  GOAL:   Patient will meet greater than or equal to 90% of their needs  MONITOR:   Supplement acceptance, PO intake, Labs, Weight trends, Skin, I & O's  REASON FOR ASSESSMENT:   Malnutrition Screening Tool  ASSESSMENT:   62 y.o. Female presenting with SOB. PMH is significant for COPD, HTN, Tobacco Abuse, HLD, Rheumatoid Arthritis.  RD spoke with patient at bedside. She is resting.  Reports her appetite is better. PO intake 50% at breakfast today. Reveals her appetite was poor and wasn't eating well PTA.   States "I haven't been eating not much of anything." Pt has drank Equate brand nutrition supplements in the past. Recently hasn't been drinking them due to cost. States "its expensive".  Amenable to Boost Plus chocolate supplements here. Labs reviewed. Mg 1.5 (L) K 3.4 (L) 10/29. Medications include Lasix.  NUTRITION - FOCUSED PHYSICAL EXAM:    Most Recent Value  Orbital Region  Moderate depletion  Upper Arm Region  Severe depletion  Thoracic and Lumbar Region  Severe depletion  Buccal Region  Moderate depletion  Temple Region  Moderate depletion  Clavicle Bone Region  Severe depletion  Clavicle and Acromion Bone Region  Severe depletion  Scapular Bone Region  Severe depletion  Dorsal Hand  Moderate depletion  Patellar Region  Severe depletion  Anterior Thigh Region  Severe depletion  Posterior Calf Region  Severe depletion  Edema (RD Assessment)  None     Diet Order:   Diet Order            Diet regular Room service appropriate? Yes; Fluid consistency: Thin  Diet effective now              EDUCATION NEEDS:   No education needs have been identified at this time  Skin:  Skin Assessment: Skin Integrity Issues: Skin Integrity Issues:: Stage I Stage I: buttocks  Last BM:  10/28  Height:   Ht Readings from Last 1 Encounters:  10/31/17 5\' 4"  (1.626 m)   Weight:   Wt Readings from Last 1 Encounters:  11/01/17 50 kg   Wt Readings from Last 15 Encounters:  11/01/17 50 kg  06/08/17 58.8 kg  05/25/17 58.6 kg  05/08/17 59.4 kg  01/26/17 63 kg  01/19/17 66.6 kg  01/10/17 66.2 kg  12/21/16 66.2 kg  08/05/16 69.4 kg  07/22/16 68.9 kg  05/17/16 68.3 kg  05/04/16 68.9 kg  02/17/16 66.7 kg  02/09/16 65.3 kg  09/02/15 66.2 kg   BMI:  Body mass index is 18.92 kg/m.  Estimated Nutritional Needs:   Kcal:  1500-1700  Protein:  70-85 gm  Fluid:  1.5-1.7 L  09/04/15, RD, LDN Pager #: [phone] After-Hours Pager #: [phone]

## 2017-11-01 NOTE — Progress Notes (Addendum)
Family Medicine Teaching Service Daily Progress Note Intern Pager: [phone]  Patient name: Judith Scott Medical record number: [id number] Date of birth: 1955-08-24 Age: 62 y.o. Gender: female  Primary Care Provider: Tillman Sers, DO Consultants: None Code Status: Full  Pt Overview and Major Events to Date:  10/29 Admitted to FPTS  Assessment and Plan: Judith Scott is a 62 y.o. female presenting with shortness of breath . PMH is significant for COPD, HTN, Tobacco Abuse, HLD, Rheumatoid Arthritis.  Acute Hypoxic Respiratory Failure Likely 2/2 COPD exacerbation.  ABG yesterday with pH 7.39, pCO2 86.3.  Patient refused BiPAP overnight.  Repeat ABG this AM with pH 7.385 and pCO2 83.5.  Given compensation with elevated CO2, patient most likely chronically retains CO2 given COPD, further supporting diagnosis of COPD exacerbation. For elevated BNP, S/P Lasix 20 yesterday with UOP not recorded 10/29.  Patient euvolemic on exam. Currently on 5L O2 per [place] at 90%.  Breathing comfortably, improved air movement throughout, still expiratory wheezing in upper to mid lung fields.  Decreased air movement in bilateral bases.  No increased work of breathing.  HR overnight to 142, patient asymptomatic.  HR now 94. Remains afebrile, WBC improved from 10.8, to 5.4. -Continuous pulse ox -O2 supplementation to keep O2 sats 88 to 92%, home O2 requirement 3 L -Strict I/O -Daily weights -Monitor fluid status, reassess need for Lasix -Continue prednisone for 5-day course -Continue Abx for 5-day course, will start PO cefdinir today -Scheduled duo nebs -Albuterol as needed -Continue home Spiriva -Dulera twice daily while inpatient, given formulary - f/u pulmonologist Dr. Sherene Sires as outpatient  COPD Likely the cause of patient's shortness of breath at present.  Uses albuterol as needed, Symbicort twice daily, Spiriva daily.  Continues to smoke 1 pack/day. -Consider discharging on Trelegy for simplify medication  management of COPD -Plan per above  Normocytic anemia Hgb 13.5 in 01/2017.  11.3>11.5.  Likely IDA, as iron 16.  Patient denies rectal or vaginal bleeding.  No colonoscopy noted in chart. -Continue to monitor CBC - ferrous sulfate 325mg  QOD - consider colonoscopy as outpatient  Hypokalemia K 3.4>30 mEq of K-Dur>3.6.  Mag 1.5. - f/u BMP - Mag 1g IV today - repeat Mg  Protein Calorie Malnutrition Albumin 2.3.  Cachetic appearing. - consider supplementing with Ensure BID  HTN On losartan at home.  BP 165/78. -Continue home losartan  Tobacco abuse Smokes 1 pack/day.  Discussed with patient the need for smoking cessation.  She agrees that she would like to start and is interested in nicotine patches. -Nicotine patch while inpatient continue as continue outpatient -Continue to encourage smoking cessation as outpatient  HLD Atorvastatin 80mg  QD at home. -Continue atorvastatin  Rheumatoid Arthritis Take etanercept once weekly and leflunomide 20 mg daily. -Continue leflunomide while inpatient  Sciatic nerve pain on right Takes gabapentin as needed with good relief. -Continue home gabapentin  FEN/GI: Heart healthy PPx: Lovenox  Disposition: pending clinical improvement  Subjective:  Patient notes that she is feeling improved this morning.  She did note some increased sleepiness yesterday but denies that today.  Stated that she rested well overnight.  Objective: Temp:  [97.8 F (36.6 C)-99 F (37.2 C)] 97.8 F (36.6 C) (10/30 0300) Pulse Rate:  [80-99] 92 (10/30 0300) Resp:  [16-29] 22 (10/30 0300) BP: (127-170)/(70-98) 165/78 (10/30 0300) SpO2:  [88 %-99 %] 90 % (10/30 0718) Weight:  [50 kg-50.4 kg] 50 kg (10/30 0500)  Physical Exam:  General: 62 y.o. female in NAD Cardio: RRR no  m/r/g Lungs: No increased work of breathing, end expiratory wheezing in mid to upper lung fields, air movement poor in bilateral bases but improved from exam yesterday Abdomen:  Soft, non-tender to palpation, positive bowel sounds Skin: warm and dry Extremities: No edema   Laboratory: Recent Labs  Lab 10/31/17 1049 11/01/17 0244  WBC 10.8*  11.3* 5.4  HGB 11.3*  11.1* 11.5*  HCT 38.7  38.7 38.5  PLT 459*  470* 413*   Recent Labs  Lab 10/31/17 1049 11/01/17 0244  NA 139 139  K 3.4* 3.6  CL 86* 84*  CO2 43* 47*  BUN 5* 6*  CREATININE 0.32* 0.49  CALCIUM 8.8* 8.5*  PROT  --  6.2*  BILITOT  --  0.3  ALKPHOS  --  70  ALT  --  9  AST  --  26  GLUCOSE 114* 125*      ABG    Component Value Date/Time   PHART 7.385 11/01/2017 0405   PCO2ART 83.5 (HH) 11/01/2017 0405   PO2ART 70.7 (L) 11/01/2017 0405   HCO3 48.8 (H) 11/01/2017 0405   TCO2 >50 (H) 10/31/2017 1725   O2SAT 92.0 11/01/2017 0405    Imaging/Diagnostic Tests: Dg Chest 2 View  Result Date: 10/31/2017 CLINICAL DATA:  Shortness of breath.  History of COPD. EXAM: CHEST - 2 VIEW COMPARISON:  05/09/2017.  01/10/2017. FINDINGS: Mediastinum and hilar structures are normal. Mild left base atelectasis/infiltrate and small left pleural effusion. No pneumothorax. Heart size normal. Small left pleural effusion. No acute bony abnormality. IMPRESSION: Mild left base atelectasis/infiltrate and small left pleural effusion. Electronically Signed   By: Maisie Fus  Register   On: 10/31/2017 13:04    Meccariello, Solmon Ice, DO 11/01/2017, 7:22 AM PGY-1, [facility] Family Medicine FPTS Intern pager: [phone], text pages welcome

## 2017-11-02 LAB — CBC
HCT: 39.1 % (ref 36.0–46.0)
HEMOGLOBIN: 11 g/dL — AB (ref 12.0–15.0)
MCH: 27.1 pg (ref 26.0–34.0)
MCHC: 28.1 g/dL — AB (ref 30.0–36.0)
MCV: 96.3 fL (ref 80.0–100.0)
PLATELETS: 493 10*3/uL — AB (ref 150–400)
RBC: 4.06 MIL/uL (ref 3.87–5.11)
RDW: 13.4 % (ref 11.5–15.5)
WBC: 7.9 10*3/uL (ref 4.0–10.5)
nRBC: 0 % (ref 0.0–0.2)

## 2017-11-02 LAB — BASIC METABOLIC PANEL
ANION GAP: 8 (ref 5–15)
BUN: 7 mg/dL — AB (ref 8–23)
CO2: 44 mmol/L — AB (ref 22–32)
Calcium: 8.6 mg/dL — ABNORMAL LOW (ref 8.9–10.3)
Chloride: 86 mmol/L — ABNORMAL LOW (ref 98–111)
Creatinine, Ser: 0.43 mg/dL — ABNORMAL LOW (ref 0.44–1.00)
GFR calc Af Amer: >60 mL/min (ref >60)
GFR calc non Af Amer: >60 mL/min (ref >60)
GLUCOSE: 107 mg/dL — AB (ref 70–99)
Potassium: 3.4 mmol/L — ABNORMAL LOW (ref 3.5–5.1)
Sodium: 138 mmol/L (ref 135–145)

## 2017-11-02 LAB — MAGNESIUM: Magnesium: 1.8 mg/dL (ref 1.7–2.4)

## 2017-11-02 MED ORDER — BOOST PLUS PO LIQD: 237.0000 mL | Freq: Three times a day (TID) | ORAL | 0 refills | Status: AC

## 2017-11-02 MED ORDER — FERROUS SULFATE 325 (65 FE) MG PO TABS: 325.0000 mg | ORAL_TABLET | ORAL | 0 refills | Status: DC

## 2017-11-02 MED ORDER — PREDNISONE 20 MG PO TABS: 40.0000 mg | ORAL_TABLET | Freq: Every day | ORAL | 0 refills | Status: DC

## 2017-11-02 MED ORDER — POTASSIUM CHLORIDE CRYS ER 20 MEQ PO TBCR
40.0000 meq | EXTENDED_RELEASE_TABLET | Freq: Once | ORAL | Status: AC
  Administered 2017-11-02: 40 meq via ORAL
  Filled 2017-11-02: qty 2

## 2017-11-02 MED ORDER — FERROUS SULFATE 325 (65 FE) MG PO TABS
325.0000 mg | ORAL_TABLET | ORAL | Status: DC
  Administered 2017-11-02: 325 mg via ORAL
  Filled 2017-11-02: qty 1

## 2017-11-02 MED ORDER — NICOTINE 21 MG/24HR TD PT24: 21.0000 mg | MEDICATED_PATCH | Freq: Every day | TRANSDERMAL | 0 refills | Status: DC

## 2017-11-02 MED ORDER — CEFDINIR 300 MG PO CAPS: 300.0000 mg | ORAL_CAPSULE | Freq: Two times a day (BID) | ORAL | 0 refills | Status: DC

## 2017-11-02 NOTE — Discharge Summary (Signed)
Family Medicine Teaching [facility] Discharge Summary  Patient name: Judith Scott Medical record number: [id number] Date of birth: [birth year] Age: 62 y.o. Gender: female Date of Admission: 10/31/2017  Date of Discharge: 11/02/2017 Admitting Physician: Myrene Buddy, MD  Primary Care Provider: Tillman Sers, DO Consultants: None  Indication for Hospitalization: Acute hypoxic respiratory failure  Discharge Diagnoses/Problem List:  Acute hypoxic respiratory failure COPD Normocytic anemia Hypokalemia Protein calorie malnutrition Hypertension Tobacco abuse Hyperlipidemia Rheumatoid arthritis Sciatic nerve pain on right  Disposition: Home  Discharge Condition: Stable  Discharge Exam:  General: 62 y.o. female in NAD Cardio: RRR no m/r/g Lungs: good air movement upper-mid lung fields, decreased air movement in bibasilar lungs, no wheezing, no increased work of breathing Abdomen: Soft, non-tender to palpation, positive bowel sounds Skin: warm and dry Extremities: No edema   Brief Hospital Course:  Judith Scott a 62 y.o.femalepresenting with shortness of breath. PMH is significant for COPD, HTN, Tobacco Abuse, HLD, Rheumatoid Arthritis.  Her hospital course as outlined below.  Acute hypoxic respiratory failure Patient noted to have worsening shortness of breath 4 days at discharge.  Other admission details can be found in the H&P.  She was noted to desaturate on her home 3 L of oxygen in the ED.  Patient was given IV antibiotics as well as IV steroids and breathing treatments in the ED.  Her BNP was mildly elevated at 362.7, therefore was given 1 dose of IV Lasix in the ED, though patient remained euvolemic on exam throughout hospitalization.  Patient's chest x-ray noted a small pulmonary effusion on the left, with infiltrate versus atelectasis in the left base.  She had a white blood cell count of 10.8 at admission and had been without fever, or cough.  She had poor air  movement in her lungs and her overall picture was suggestive of a COPD exacerbation.  She was continued to be treated with a 5-day course of antibiotics and was discharged on oral cefdinir.  She was also treated with a 5-day course of prednisone and scheduled duo nebs.  Her breathing continued to improve, her air movement improved on exam, she was no longer wheezing on exam.  The patient walked with PT and required 5 L of oxygen to avoid desaturations while ambulating.  It is possible that this is the patient's new baseline given her advanced lung disease.  She has seen pulmonology in the past, but not in many years.  She had various ABGs performed throughout her hospitalization which showed elevated PCO2's in the 80s with pH around 7.4 and bicarb in the 40s.  She was discharged home on her home regimen of Symbicort and Spiriva, as well as albuterol as needed.  She was counseled on the need for smoking cessation especially given her advanced lung disease.  She was very enthusiastic about quitting.  She was also sent home with a 6-week course of 21 mg nicotine patches and the information for the [place] quit line.  She will require further taper for nicotine patch after the 6 weeks as well as support to ensure success of quitting.  Normocytic anemia Patient was noted to have a normocytic anemia with a hemoglobin around 11.  Prior hemoglobin in January 2019 was 13.5.  She denied any rectal or vaginal bleeding.  She appears to not have any colonoscopies in the past.  She was started on iron supplementation every other day and should have a colonoscopy as an outpatient.  The patient's other chronic medical problems were  addressed and treated during her hospitalization without complication.  At the time of discharge her vital signs were stable she was satting well on 5 L of oxygen per nasal cannula and she was without complaint.   Issues for Follow Up:  1. Patient will need smoking cessation resources.   She was discharged with an Rx for 6 weeks of 21mg  nicotine patches.  She will need to continue taper with 14mg  x 14 days and then 7mg  x 14 days. 2. Patient needs follow up with pulmonology.  She has seen Dr. Sherene Sires in the past, but not for many years. 3. Patient will need a colonoscopy as outpatient.  She was started on ferrous sulfate QOD for likely IDA. 4. Consider changing to Trelegy as outpatient depending on cost to simplify therapy. 5. Her potassium was 3.4 at the time of discharge.  This was repleted with 40 mEq of K-Dur.  She should have a repeat BMP at her follow-up appointment.  Significant Procedures: None  Significant Labs and Imaging:  Recent Labs  Lab 10/31/17 1049 11/01/17 0244 11/02/17 0317  WBC 10.8*  11.3* 5.4 7.9  HGB 11.3*  11.1* 11.5* 11.0*  HCT 38.7  38.7 38.5 39.1  PLT 459*  470* 413* 493*   Recent Labs  Lab 10/31/17 1049 11/01/17 0244 11/02/17 0317 11/02/17 0730  NA 139 139 138  --   K 3.4* 3.6 3.4*  --   CL 86* 84* 86*  --   CO2 43* 47* 44*  --   GLUCOSE 114* 125* 107*  --   BUN 5* 6* 7*  --   CREATININE 0.32* 0.49 0.43*  --   CALCIUM 8.8* 8.5* 8.6*  --   MG 1.5*  --   --  1.8  ALKPHOS  --  70  --   --   AST  --  26  --   --   ALT  --  9  --   --   ALBUMIN  --  2.3*  --   --     Dg Chest 2 View  Result Date: 10/31/2017 CLINICAL DATA:  Shortness of breath.  History of COPD. EXAM: CHEST - 2 VIEW COMPARISON:  05/09/2017.  01/10/2017. FINDINGS: Mediastinum and hilar structures are normal. Mild left base atelectasis/infiltrate and small left pleural effusion. No pneumothorax. Heart size normal. Small left pleural effusion. No acute bony abnormality. IMPRESSION: Mild left base atelectasis/infiltrate and small left pleural effusion. Electronically Signed   By: Maisie Fus  Register   On: 10/31/2017 13:04   Results/Tests Pending at Time of Discharge: None  Discharge Medications:  Allergies as of 11/02/2017   No Known Allergies     Medication List     STOP taking these medications   benzonatate 100 MG capsule Commonly known as:  TESSALON   guaiFENesin-dextromethorphan 100-10 MG/5ML syrup Commonly known as:  ROBITUSSIN DM   levofloxacin 750 MG tablet Commonly known as:  LEVAQUIN   nicotine 14 mg/24hr patch Commonly known as:  NICODERM CQ - dosed in mg/24 hours Replaced by:  nicotine 21 mg/24hr patch     TAKE these medications   albuterol 108 (90 Base) MCG/ACT inhaler Commonly known as:  PROVENTIL HFA;VENTOLIN HFA Inhale 2 puffs into the lungs every 4 (four) hours as needed for wheezing or shortness of breath.   albuterol (2.5 MG/3ML) 0.083% nebulizer solution Commonly known as:  PROVENTIL Take 3 mLs (2.5 mg total) by nebulization every 6 (six) hours as needed for wheezing or shortness of breath.  atorvastatin 80 MG tablet Commonly known as:  LIPITOR TAKE 1 TABLET (80 MG TOTAL) BY MOUTH DAILY AT 6 PM. What changed:  when to take this   cefdinir 300 MG capsule Commonly known as:  OMNICEF Take 1 capsule (300 mg total) by mouth 2 (two) times daily.   etanercept 25 MG injection Commonly known as:  ENBREL Inject 25 mg into the skin once a week.   ferrous sulfate 325 (65 FE) MG tablet Take 1 tablet (325 mg total) by mouth every other day. Start taking on:  11/04/2017   furosemide 20 MG tablet Commonly known as:  LASIX Take 1 tablet (20 mg total) by mouth daily as needed for edema.   gabapentin 100 MG capsule Commonly known as:  NEURONTIN Take 1 capsule (100 mg total) by mouth 3 (three) times daily.   lactose free nutrition Liqd Take 237 mLs by mouth 3 (three) times daily with meals.   leflunomide 20 MG tablet Commonly known as:  ARAVA Take 20 mg by mouth daily.   losartan 25 MG tablet Commonly known as:  COZAAR Take 50 mg by mouth daily. What changed:  Another medication with the same name was removed. Continue taking this medication, and follow the directions you see here.   nicotine 21 mg/24hr  patch Commonly known as:  NICODERM CQ - dosed in mg/24 hours Place 1 patch (21 mg total) onto the skin daily. Start taking on:  11/03/2017 Replaces:  nicotine 14 mg/24hr patch   OXYGEN 3lpm with sleep and as needed during the day   predniSONE 20 MG tablet Commonly known as:  DELTASONE Take 2 tablets (40 mg total) by mouth daily with breakfast. Start taking on:  11/03/2017 What changed:  Another medication with the same name was removed. Continue taking this medication, and follow the directions you see here.   SYMBICORT 160-4.5 MCG/ACT inhaler Generic drug:  budesonide-formoterol TAKE 2 PUFFS BY MOUTH TWICE A DAY What changed:  See the new instructions.   tiotropium 18 MCG inhalation capsule Commonly known as:  SPIRIVA Place 1 capsule (18 mcg total) into inhaler and inhale daily.            Durable Medical Equipment  (From admission, onward)         Start     Ordered   11/02/17 0000  For home use only DME oxygen    Question:  Oxygen delivery system  Answer:  Gas   11/02/17 1319   11/02/17 0000  For home use only DME oxygen    Comments:  Needs 6L when walking, can probably use 5L when resting  Question Answer Comment  Mode or (Route) Nasal cannula   Liters per Minute 6   Frequency Continuous (stationary and portable oxygen unit needed)   Oxygen delivery system Gas      11/02/17 1319          Discharge Instructions: Please refer to Patient Instructions section of EMR for full details.  Patient was counseled important signs and symptoms that should prompt return to medical care, changes in medications, dietary instructions, activity restrictions, and follow up appointments.   Follow-Up Appointments: Follow-up Information    Mirian Mo, MD. Go on 11/09/2017.   Specialty:  Family Medicine Why:  at 1:45pm for your hospital follow up appointment Contact information: [address] [place] [address] [phone]        Nyoka Cowden, MD. Call.    Specialty:  Pulmonary Disease Why:  to make  an appointment in the next 2-4 weeks. Contact information: [address] [place] [address] [phone]           Unknown Jim, DO 11/02/2017, 9:36 PM PGY-1, [facility] Family Medicine

## 2017-11-02 NOTE — Discharge Instructions (Signed)
Call 1800-QUIT-NOW for help with stopping smoking. They can assist with free resources such as patches, check-in calls, and counseling.   Continue to take your antibiotic and steroid, you will finish them both on 11/2.  I have scheduled you a follow up appointment at the [facility] on 11/7 at 1:45pm with Dr. Homero Fellers.    Chronic Obstructive Pulmonary Disease Chronic obstructive pulmonary disease (COPD) is a long-term (chronic) condition that affects the lungs. COPD is a general term that can be used to describe many different lung problems that cause lung swelling (inflammation) and limit airflow, including chronic bronchitis and emphysema. If you have COPD, your lung function will probably never return to normal. In most cases, it gets worse over time. However, there are steps you can take to slow the progression of the disease and improve your quality of life. What are the causes? This condition may be caused by:  Smoking. This is the most common cause.  Certain genes passed down through families.  What increases the risk? The following factors may make you more likely to develop this condition:  Secondhand smoke from cigarettes, pipes, or cigars.  Exposure to chemicals and other irritants such as fumes and dust in the work environment.  Chronic lung conditions or infections.  What are the signs or symptoms? Symptoms of this condition include:  Shortness of breath, especially during physical activity.  Chronic cough with a large amount of thick mucus. Sometimes the cough may not have any mucus (dry cough).  Wheezing.  Rapid breaths.  Gray or bluish discoloration (cyanosis) of the skin, especially in your fingers, toes, or lips.  Feeling tired (fatigue).  Weight loss.  Chest tightness.  Frequent infections.  Episodes when breathing symptoms become much worse (exacerbations).  Swelling in the ankles, feet, or legs. This may occur in later stages of the  disease.  How is this diagnosed? This condition is diagnosed based on:  Your medical history.  A physical exam.  You may also have tests, including:  Lung (pulmonary) function tests. This may include a spirometry test, which measures your ability to exhale properly.  Chest X-ray.  CT scan.  Blood tests.  How is this treated? This condition may be treated with:  Medicines. These may include inhaled rescue medicines to treat acute exacerbations as well as long-term, or maintenance, medicines to prevent flare-ups of COPD. ? Bronchodilators help treat COPD by dilating the airways to allow increased airflow and make your breathing more comfortable. ? Steroids can reduce airway inflammation and help prevent exacerbations.  Smoking cessation. If you smoke, your health care provider may ask you to quit, and may also recommend therapy or replacement products to help you quit.  Pulmonary rehabilitation. This may involve working with a team of health care providers and specialists, such as respiratory, occupational, and physical therapists.  Exercise and physical activity. These are beneficial for nearly all people with COPD.  Nutrition therapy to gain weight, if you are underweight.  Oxygen. Supplemental oxygen therapy is only helpful if you have a low oxygen level in your blood (hypoxemia).  Lung surgery or transplant.  Palliative care. This is to help people with COPD feel comfortable when treatment is no longer working.  Follow these instructions at home: Medicines  Take over-the-counter and prescription medicines (inhaled or pills) only as told by your health care provider.  Talk to your health care provider before taking any cough or allergy medicines. You may need to avoid certain medicines that dry  out your airways. Lifestyle  If you are a smoker, the most important thing that you can do is to stop smoking. Do not use any products that contain nicotine or tobacco, such as  cigarettes and e-cigarettes. If you need help quitting, ask your health care provider. Continuing to smoke will cause the disease to progress faster.  Avoid exposure to things that irritate your lungs, such as smoke, chemicals, and fumes.  Stay active, but balance activity with periods of rest. Exercise and physical activity will help you maintain your ability to do things you want to do.  Learn and use relaxation techniques to manage stress and to control your breathing.  Get the right amount of sleep and get quality sleep. Most adults need 7 or more hours per night.  Eat healthy foods. Eating smaller, more frequent meals and resting before meals may help you maintain your strength. Controlled breathing Learn and use controlled breathing techniques as directed by your health care provider. Controlled breathing techniques include:  Pursed lip breathing. Start by breathing in (inhaling) through your nose for 1 second. Then, purse your lips as if you were going to whistle and breathe out (exhale) through the pursed lips for 2 seconds.  Diaphragmatic breathing. Start by putting one hand on your abdomen just above your waist. Inhale slowly through your nose. The hand on your abdomen should move out. Then purse your lips and exhale slowly. You should be able to feel the hand on your abdomen moving in as you exhale.  Controlled coughing Learn and use controlled coughing to clear mucus from your lungs. Controlled coughing is a series of short, progressive coughs. The steps of controlled coughing are: 1. Lean your head slightly forward. 2. Breathe in deeply using diaphragmatic breathing. 3. Try to hold your breath for 3 seconds. 4. Keep your mouth slightly open while coughing twice. 5. Spit any mucus out into a tissue. 6. Rest and repeat the steps once or twice as needed.  General instructions  Make sure you receive all the vaccines that your health care provider recommends, especially the  pneumococcal and influenza vaccines. Preventing infection and hospitalization is very important when you have COPD.  Use oxygen therapy and pulmonary rehabilitation if directed to by your health care provider. If you require home oxygen therapy, ask your health care provider whether you should purchase a pulse oximeter to measure your oxygen level at home.  Work with your health care provider to develop a COPD action plan. This will help you know what steps to take if your condition gets worse.  Keep other chronic health conditions under control as told by your health care provider.  Avoid extreme temperature and humidity changes.  Avoid contact with people who have an illness that spreads from person to person (is contagious), such as viral infections or pneumonia.  Keep all follow-up visits as told by your health care provider. This is important. Contact a health care provider if:  You are coughing up more mucus than usual.  There is a change in the color or thickness of your mucus.  Your breathing is more labored than usual.  Your breathing is faster than usual.  You have difficulty sleeping.  You need to use your rescue medicines or inhalers more often than expected.  You have trouble doing routine activities such as getting dressed or walking around the house. Get help right away if:  You have shortness of breath while you are resting.  You have shortness of breath  that prevents you from: ? Being able to talk. ? Performing your usual physical activities.  You have chest pain lasting longer than 5 minutes.  Your skin color is more blue (cyanotic) than usual.  You measure low oxygen saturations for longer than 5 minutes with a pulse oximeter.  You have a fever.  You feel too tired to breathe normally. Summary  Chronic obstructive pulmonary disease (COPD) is a long-term (chronic) condition that affects the lungs.  Your lung function will probably never return to  normal. In most cases, it gets worse over time. However, there are steps you can take to slow the progression of the disease and improve your quality of life.  Treatment for COPD may include taking medicines, quitting smoking, pulmonary rehabilitation, and changes to diet and exercise. As the disease progresses, you may need oxygen therapy, a lung transplant, or palliative care.  To help manage your condition, do not smoke, avoid exposure to things that irritate your lungs, stay up to date on all vaccines, and follow your health care provider's instructions for taking medicines. This information is not intended to replace advice given to you by your health care provider. Make sure you discuss any questions you have with your health care provider. Document Released: 09/29/2004 Document Revised: 01/25/2016 Document Reviewed: 01/25/2016 Elsevier Interactive Patient Education  [employer].

## 2017-11-02 NOTE — Evaluation (Signed)
Physical Therapy Evaluation Patient Details Name: Judith Scott MRN: [id number] DOB: October 07, 1955 Today's Date: 11/02/2017   History of Present Illness  Judith Scott is a 62 y.o. female presenting with shortness of breath . PMH is significant for COPD, HTN, Tobacco Abuse, HLD, Rheumatoid Arthritis.    Clinical Impression  Patient evaluated by Physical Therapy with no further acute PT needs identified. All education has been completed and the patient has no further questions. Pt ambulating near baseline, independent no balance concerns. Requring more home O2, desats on 3L at rest, 5L remains above 90%, 6L with ambulation. discussed energy conservation. Pt does not wish to pursue any physical therapy after discharge. See below for any follow-up Physical Therapy or equipment needs. PT is signing off. Thank you for this referral.   SATURATION QUALIFICATIONS: (This note is used to comply with regulatory documentation for home oxygen)  Patient Saturations on Room Air at Rest = 79%  Patient Saturations on Room Air while Ambulating = N/T%  Patient Saturations on 6 Liters of oxygen while Ambulating = 90%  Please briefly explain why patient needs home oxygen: desats without O2    Follow Up Recommendations (Pt denying any follow up PT after d/c )    Equipment Recommendations  Other (comment)(home O2)    Recommendations for Other Services       Precautions / Restrictions Precautions Precaution Comments: watch O2      Mobility  Bed Mobility Overal bed mobility: Independent                Transfers Overall transfer level: Independent                  Ambulation/Gait Ambulation/Gait assistance: Independent Gait Distance (Feet): 500 Feet         General Gait Details: deats on 6L to 86 returns quickly to 92% with rest.   Stairs            Wheelchair Mobility    Modified Rankin (Stroke Patients Only)       Balance Overall balance assessment: Independent                                           Pertinent Vitals/Pain Pain Assessment: No/denies pain    Home Living Family/patient expects to be discharged to:: Private residence Living Arrangements: Children;Spouse/significant other Available Help at Discharge: Family   Home Access: Stairs to enter   Technical brewer of Steps: 2 Home Layout: One level        Prior Function Level of Independence: Independent               Hand Dominance        Extremity/Trunk Assessment   Upper Extremity Assessment Upper Extremity Assessment: Overall WFL for tasks assessed    Lower Extremity Assessment Lower Extremity Assessment: Overall WFL for tasks assessed       Communication   Communication: No difficulties  Cognition Arousal/Alertness: Awake/alert                                            General Comments General comments (skin integrity, edema, etc.): Discussed enegry conservation     Exercises     Assessment/Plan    PT Assessment All further PT needs can be met in  the next venue of care  PT Problem List Cardiopulmonary status limiting activity       PT Treatment Interventions      PT Goals (Current goals can be found in the Care Plan section)  Acute Rehab PT Goals Patient Stated Goal: go home PT Goal Formulation: With patient    Frequency     Barriers to discharge        Co-evaluation               AM-PAC PT "6 Clicks" Daily Activity  Outcome Measure Difficulty turning over in bed (including adjusting bedclothes, sheets and blankets)?: None Difficulty moving from lying on back to sitting on the side of the bed? : None Difficulty sitting down on and standing up from a chair with arms (e.g., wheelchair, bedside commode, etc,.)?: None Help needed moving to and from a bed to chair (including a wheelchair)?: None Help needed walking in hospital room?: None Help needed climbing 3-5 steps with a railing? :  None 6 Click Score: 24    End of Session Equipment Utilized During Treatment: Gait belt;Oxygen Activity Tolerance: Patient tolerated treatment well Patient left: in bed   PT Visit Diagnosis: Muscle weakness (generalized) (M62.81)    Time: 3967-2897 PT Time Calculation (min) (ACUTE ONLY): 24 min   Charges:   PT Evaluation $PT Eval Low Complexity: 1 Low PT Treatments $Gait Training: 8-22 mins       Reinaldo Berber, PT, DPT Acute Rehabilitation Services Pager: [phone] Office: [place] 11/02/2017, 11:22 AM

## 2017-11-02 NOTE — Progress Notes (Signed)
Family Medicine Teaching Service Daily Progress Note Intern Pager: [phone]  Patient name: Judith Scott Medical record number: [id number] Date of birth: November 13, 1955 Age: 62 y.o. Gender: female  Primary Care Provider: Tillman Sers, DO Consultants: None Code Status: Full  Pt Overview and Major Events to Date:  10/29 Admitted to FPTS  Assessment and Plan: Bryssa Tones is a 63 y.o. female presenting with shortness of breath . PMH is significant for COPD, HTN, Tobacco Abuse, HLD, Rheumatoid Arthritis.  Acute Hypoxic Respiratory Failure Likely 2/2 COPD exacerbation. Home O2 requirement 3L per [facility].  Overnight on 5L per O2 with appropriate saturations.  ABG this AM pH 7.385, pCO2 83.5, pO2 70.7, Bicarb 48.8.  This AM patient remains on 5L with O2 sats 92% during examination.  Good air movement upper to mid lungs on exam today.  No wheezing.  Breathing comfortably.  WBC 7.9. Has remained afebrile.   -Continuous pulse ox -O2 supplementation to keep O2 sats 88 to 92%, home O2 requirement 3 L -Strict I/O -Daily weights -Monitor fluid status, reassess need for Lasix -Continue prednisone for 5-day course (10/30-11/2) -PO cefdinir for 5 days total therapy (10/30-11/2) -Scheduled duo nebs -Continue incruse ellipta -Dulera twice daily while inpatient, given formulary - f/u pulmonologist Dr. Sherene Sires as outpatient  COPD Likely the cause of patient's shortness of breath at present.  Uses albuterol as needed, Symbicort twice daily, Spiriva daily.  Continues to smoke 1 pack/day. -Consider discharging on Trelegy for simplify medication management of COPD -Plan per above  Normocytic anemia Hgb 13.5 in 01/2017.  11.3>11.5>11.  Likely IDA, as iron 16.  Patient denies rectal or vaginal bleeding.  No colonoscopy noted in chart. -Continue to monitor CBC - ferrous sulfate 325mg  QOD  - consider colonoscopy as outpatient  Hypokalemia K 3.4>30 mEq of K-Dur>3.6>3.4.  Mag 1.5>1g IV>1.8. - f/u BMP - 40 mEq  K-Dur x1  Protein Calorie Malnutrition Albumin 2.3.  Cachetic appearing. - consider supplementing with Ensure BID  HTN On losartan at home.  BP 148/79. -Continue home losartan  Tobacco abuse Smokes 1 pack/day.  Discussed with patient the need for smoking cessation.  She agrees that she would like to start and is interested in nicotine patches. -Nicotine patch while inpatient continue as continue outpatient -Continue to encourage smoking cessation as outpatient  HLD Atorvastatin 80mg  QD at home. -Continue atorvastatin  Rheumatoid Arthritis Take etanercept once weekly and leflunomide 20 mg daily. -Continue leflunomide while inpatient  Sciatic nerve pain on right Takes gabapentin as needed with good relief. -Continue home gabapentin  FEN/GI: Heart healthy PPx: Lovenox  Disposition: likely home today  Subjective:  Patient states that she is feeling well this morning.  States that she feels "much better" than when she came in.  States that she is looking forward to going home today.  She notes that she is motivated to quit smoking.  She would like to continue using nicotine patches.  Objective: Temp:  [97.5 F (36.4 C)-99.5 F (37.5 C)] 97.5 F (36.4 C) (10/31 0005) Pulse Rate:  [82-102] 83 (10/31 0005) Resp:  [12-24] 19 (10/31 0005) BP: (130-151)/(54-89) 148/79 (10/31 0005) SpO2:  [90 %-99 %] 99 % (10/31 0225) Weight:  [51.3 kg] 51.3 kg (10/31 0433)  Physical Exam: General: 62 y.o. female in NAD Cardio: RRR no m/r/g Lungs: good air movement upper-mid lung fields, decreased air movement in bibasilar lungs, no wheezing, no increased work of breathing Abdomen: Soft, non-tender to palpation, positive bowel sounds Skin: warm and dry Extremities: No edema  Laboratory: Recent Labs  Lab 10/31/17 1049 11/01/17 0244 11/02/17 0317  WBC 10.8*  11.3* 5.4 7.9  HGB 11.3*  11.1* 11.5* 11.0*  HCT 38.7  38.7 38.5 39.1  PLT 459*  470* 413* 493*   Recent Labs   Lab 10/31/17 1049 11/01/17 0244 11/02/17 0317  NA 139 139 138  K 3.4* 3.6 3.4*  CL 86* 84* 86*  CO2 43* 47* 44*  BUN 5* 6* 7*  CREATININE 0.32* 0.49 0.43*  CALCIUM 8.8* 8.5* 8.6*  PROT  --  6.2*  --   BILITOT  --  0.3  --   ALKPHOS  --  70  --   ALT  --  9  --   AST  --  26  --   GLUCOSE 114* 125* 107*      ABG    Component Value Date/Time   PHART 7.385 11/01/2017 0405   PCO2ART 83.5 (HH) 11/01/2017 0405   PO2ART 70.7 (L) 11/01/2017 0405   HCO3 48.8 (H) 11/01/2017 0405   TCO2 >50 (H) 10/31/2017 1725   O2SAT 92.0 11/01/2017 0405    Imaging/Diagnostic Tests: No results found.  Clarinda Obi, Solmon Ice, DO 11/02/2017, 7:14 AM PGY-1, [facility] FPTS Intern pager: [phone], text pages welcome

## 2017-11-09 ENCOUNTER — Encounter: Payer: Self-pay | Admitting: Family Medicine

## 2017-11-09 ENCOUNTER — Ambulatory Visit: Payer: Medicaid Other | Admitting: Family Medicine

## 2017-11-09 ENCOUNTER — Other Ambulatory Visit: Payer: Self-pay

## 2017-11-09 VITALS — BP 116/80 | HR 88 | Temp 98.2°F | Ht 64.0 in | Wt 116.0 lb

## 2017-11-09 DIAGNOSIS — Z716 Tobacco abuse counseling: Secondary | ICD-10-CM

## 2017-11-09 DIAGNOSIS — Z09 Encounter for follow-up examination after completed treatment for conditions other than malignant neoplasm: Secondary | ICD-10-CM | POA: Diagnosis not present

## 2017-11-09 DIAGNOSIS — D509 Iron deficiency anemia, unspecified: Secondary | ICD-10-CM

## 2017-11-09 DIAGNOSIS — Z720 Tobacco use: Secondary | ICD-10-CM

## 2017-11-09 DIAGNOSIS — J449 Chronic obstructive pulmonary disease, unspecified: Secondary | ICD-10-CM

## 2017-11-09 MED ORDER — NICOTINE 7 MG/24HR TD PT24: 7.0000 mg | MEDICATED_PATCH | Freq: Every day | TRANSDERMAL | 0 refills | Status: DC

## 2017-11-09 MED ORDER — NICOTINE 14 MG/24HR TD PT24: 14.0000 mg | MEDICATED_PATCH | Freq: Every day | TRANSDERMAL | 0 refills | Status: DC

## 2017-11-09 NOTE — Assessment & Plan Note (Addendum)
Pt appears to be recovering well from her hospitalization although not yet back to 100%.  Pt encouraged to ambulate to aid resolution of remaining atelectasis. -f/u with pulmonology in part to discuss the value of Colonoscopy and whether it is feasible wither her lung health.

## 2017-11-09 NOTE — Assessment & Plan Note (Addendum)
Low iron during hospitalization 10/27.  Overdue for colonoscopy.  Advised to see GI following pulm appointment.  Colonoscopy now high risk due to advanced lung disease.   -continue iron supplementation  -Colonoscopy pending pulm visit

## 2017-11-09 NOTE — Progress Notes (Signed)
    Subjective:  Judith Scott is a 62 y.o. female who presents to the [facility] today for a hospital follow up after a COPD exacerbation.  HPI: Judith Scott was recently hospitalized for a COPD exacerbation. She was discharged on 10/29.  Since her discharge, she feels that she has been doing well.    COPD: She has been breathing without difficulty and is currently on 5 liters [place] at home with 6 liters during ambulation. She denies fever, sputum production, chest pain.  Her appetite has returned and she is now eating and drinking well.  On arrival to the clnic, she was saturating at 83% but she was using a reduced O2 flow on her Prien out of fear that she would run out before returning home.  She was given 5 liters O2 of the clinic O2 supply and was found to be saturating at 94%.  Smoking cessation: She continues to smoke 3-5 cigarettes/day although she states that she does want to quit and is trying to cut back.  She is currently on a 21 mg nicotine patch and does not want to consider pharmacologic aids like Wellbutrin or Chantix at this time. Other methods of behavior modification and distraction were discussed.  Anemia: She was noted to have a hemoglobin down to 11 in the hospital without any obvious signs of bleeding. She has bee taking iron supplements every other day. She takes miralax to aid with the associated constipation.  She has been advised to consider a colonoscopy to rule out GI sources of bleeding.  Objective:  Physical Exam: BP 116/80 (BP Location: Left Arm, Patient Position: Sitting, Cuff Size: Normal)   Pulse 88   Temp 98.2 F (36.8 C) (Oral)   Ht 5\' 4"  (1.626 m)   Wt 116 lb (52.6 kg)   SpO2 94% Comment: after ambulating down hall  BMI 19.91 kg/m    Gen: NAD, resting comfortably, nasal cannula in place, friendly and engaged in the conversation, gaunt appearing. Smelling of cigarettes.  CV: RRR with no murmurs appreciated Pulm: Less air movement on the left middle and lower lobes,  mild wheezing noted in right upper lobe, normal respiratory effort on 5 L [place],  MSK: no edema, cyanosis, or clubbing noted Skin: warm, dry  No results found for this or any previous visit (from the past 72 hour(s)).   Assessment/Plan:  COPD GOLD III  Pt appears to be recovering well from her hospitalization although not yet back to 100%.  Pt encouraged to ambulate to aid resolution of remaining atelectasis. -f/u with pulmonology in part to discuss the value of Colonoscopy and whether it is feasible wither her lung health.   Tobacco abuse Interested in smoking cessation. Does not desire medication to aid with cravings for now. Behavior modification was discussed in clinic today. Nicotine patch: 21 mg for 4 weeks 14 mg for 2 weeks 7 mg for 2 weeks -f/u visit in 1 month to assess smoking cessation  Iron deficiency anemia Low iron during hospitalization 10/27.  Overdue for colonoscopy.  Advised to see GI following pulm appointment.  Colonoscopy now high risk due to advanced lung disease.   -continue iron supplementation  -Colonoscopy pending pulm visit  Hospital follow up tasks: -f/u on BMP and Mg drawn today

## 2017-11-09 NOTE — Assessment & Plan Note (Addendum)
Interested in smoking cessation. Does not desire medication to aid with cravings for now. Behavior modification was discussed in clinic today. Nicotine patch: 21 mg for 4 weeks 14 mg for 2 weeks 7 mg for 2 weeks -f/u visit in 1 month to assess smoking cessation

## 2017-11-09 NOTE — Patient Instructions (Addendum)
Thanks for coming in today.  We covered a lot so here is a quick summary:  COPD: I'm glad your doing better, keep up the good work.  Smoking: Continue to try cutting back with a goal of quitting!  This will keep you healthier and give you the best lung function. For now, your nicotine patches should be used as follows: 21 mg: 6 weeks 14 mg 2 weeks 7 mg 2 weeks  Pulmonology: We have put in a referral to pulmonology (lung doctors) they will call you to set up an appointment.  Colonoscopy: We have also put in a referral to the be seen by GI (stomach doctors) to do a colonoscopy.  Make sure this appointment is after your lung doctor appointment.  Anemia: continue taking your iron supplements.

## 2017-11-10 LAB — BASIC METABOLIC PANEL
BUN/Creatinine Ratio: 16 (ref 12–28)
BUN: 6 mg/dL — ABNORMAL LOW (ref 8–27)
CALCIUM: 8.8 mg/dL (ref 8.7–10.3)
CHLORIDE: 89 mmol/L — AB (ref 96–106)
CO2: 35 mmol/L — ABNORMAL HIGH (ref 20–29)
Creatinine, Ser: 0.37 mg/dL — ABNORMAL LOW (ref 0.57–1.00)
GFR calc non Af Amer: 113 mL/min/{1.73_m2} (ref >59)
GFR, EST AFRICAN AMERICAN: 131 mL/min/{1.73_m2} (ref >59)
GLUCOSE: 97 mg/dL (ref 65–99)
POTASSIUM: 4.9 mmol/L (ref 3.5–5.2)
Sodium: 138 mmol/L (ref 134–144)

## 2017-11-10 LAB — MAGNESIUM: Magnesium: 1.7 mg/dL (ref 1.6–2.3)

## 2017-11-13 ENCOUNTER — Telehealth: Payer: Self-pay | Admitting: *Deleted

## 2017-11-13 ENCOUNTER — Encounter: Payer: Self-pay | Admitting: Family Medicine

## 2017-11-13 DIAGNOSIS — J449 Chronic obstructive pulmonary disease, unspecified: Secondary | ICD-10-CM

## 2017-11-13 NOTE — Telephone Encounter (Signed)
Pt needs a new order for a nebulizer.  She states that the one she has is really old and barely works anymore.  Will forward to MD to place and will then send community message to [facility] to get pt set up with new machine.  Fleeger, Maryjo Rochester, CMA

## 2017-11-14 NOTE — Telephone Encounter (Signed)
Order placed for nebulizer machine  Dolores Patty, DO PGY-3, [facility] 11/14/2017 2:53 PM

## 2017-11-14 NOTE — Telephone Encounter (Signed)
Community message sent to [employer] and [facility] to process DME Nebulizer order.  Destany Severns, Maryjo Rochester, CMA

## 2017-11-16 NOTE — Telephone Encounter (Signed)
Pt still has not heard from [facility].  Will resend a community message. Ahmed Inniss, Maryjo Rochester, CMA

## 2017-11-25 ENCOUNTER — Other Ambulatory Visit: Payer: Self-pay | Admitting: Family Medicine

## 2017-11-27 NOTE — Telephone Encounter (Signed)
Processed on 11/14 per community message. Fleeger, Maryjo Rochester, CMA

## 2017-12-04 ENCOUNTER — Other Ambulatory Visit: Payer: Self-pay | Admitting: Family Medicine

## 2017-12-25 ENCOUNTER — Other Ambulatory Visit: Payer: Self-pay | Admitting: Family Medicine

## 2018-01-12 ENCOUNTER — Inpatient Hospital Stay (HOSPITAL_COMMUNITY)
Admission: EM | Admit: 2018-01-12 | Discharge: 2018-02-03 | DRG: 189 | Disposition: E | Payer: Medicaid Other | Attending: Pulmonary Disease | Admitting: Pulmonary Disease

## 2018-01-12 ENCOUNTER — Emergency Department (HOSPITAL_COMMUNITY): Payer: Medicaid Other

## 2018-01-12 DIAGNOSIS — I471 Supraventricular tachycardia: Secondary | ICD-10-CM | POA: Diagnosis not present

## 2018-01-12 DIAGNOSIS — E870 Hyperosmolality and hypernatremia: Secondary | ICD-10-CM | POA: Diagnosis not present

## 2018-01-12 DIAGNOSIS — D649 Anemia, unspecified: Secondary | ICD-10-CM | POA: Diagnosis present

## 2018-01-12 DIAGNOSIS — J9621 Acute and chronic respiratory failure with hypoxia: Principal | ICD-10-CM | POA: Diagnosis present

## 2018-01-12 DIAGNOSIS — R64 Cachexia: Secondary | ICD-10-CM | POA: Diagnosis present

## 2018-01-12 DIAGNOSIS — J441 Chronic obstructive pulmonary disease with (acute) exacerbation: Secondary | ICD-10-CM | POA: Diagnosis present

## 2018-01-12 DIAGNOSIS — J9622 Acute and chronic respiratory failure with hypercapnia: Secondary | ICD-10-CM | POA: Diagnosis present

## 2018-01-12 DIAGNOSIS — M069 Rheumatoid arthritis, unspecified: Secondary | ICD-10-CM | POA: Diagnosis present

## 2018-01-12 DIAGNOSIS — E7800 Pure hypercholesterolemia, unspecified: Secondary | ICD-10-CM | POA: Diagnosis present

## 2018-01-12 DIAGNOSIS — G9340 Encephalopathy, unspecified: Secondary | ICD-10-CM | POA: Diagnosis not present

## 2018-01-12 DIAGNOSIS — Z781 Physical restraint status: Secondary | ICD-10-CM

## 2018-01-12 DIAGNOSIS — I10 Essential (primary) hypertension: Secondary | ICD-10-CM | POA: Diagnosis present

## 2018-01-12 DIAGNOSIS — J449 Chronic obstructive pulmonary disease, unspecified: Secondary | ICD-10-CM

## 2018-01-12 DIAGNOSIS — Z801 Family history of malignant neoplasm of trachea, bronchus and lung: Secondary | ICD-10-CM

## 2018-01-12 DIAGNOSIS — J9690 Respiratory failure, unspecified, unspecified whether with hypoxia or hypercapnia: Secondary | ICD-10-CM

## 2018-01-12 DIAGNOSIS — J9602 Acute respiratory failure with hypercapnia: Secondary | ICD-10-CM

## 2018-01-12 DIAGNOSIS — Z681 Body mass index (BMI) 19 or less, adult: Secondary | ICD-10-CM

## 2018-01-12 DIAGNOSIS — E872 Acidosis: Secondary | ICD-10-CM | POA: Diagnosis present

## 2018-01-12 DIAGNOSIS — G9341 Metabolic encephalopathy: Secondary | ICD-10-CM | POA: Diagnosis not present

## 2018-01-12 DIAGNOSIS — E785 Hyperlipidemia, unspecified: Secondary | ICD-10-CM | POA: Diagnosis present

## 2018-01-12 DIAGNOSIS — Z66 Do not resuscitate: Secondary | ICD-10-CM | POA: Diagnosis not present

## 2018-01-12 DIAGNOSIS — Z7951 Long term (current) use of inhaled steroids: Secondary | ICD-10-CM

## 2018-01-12 DIAGNOSIS — J9601 Acute respiratory failure with hypoxia: Secondary | ICD-10-CM | POA: Diagnosis not present

## 2018-01-12 DIAGNOSIS — Z515 Encounter for palliative care: Secondary | ICD-10-CM | POA: Diagnosis not present

## 2018-01-12 DIAGNOSIS — F17210 Nicotine dependence, cigarettes, uncomplicated: Secondary | ICD-10-CM | POA: Diagnosis present

## 2018-01-12 DIAGNOSIS — Z7189 Other specified counseling: Secondary | ICD-10-CM | POA: Diagnosis not present

## 2018-01-12 LAB — CBC WITH DIFFERENTIAL/PLATELET
Abs Immature Granulocytes: 0.11 10*3/uL — ABNORMAL HIGH (ref 0.00–0.07)
Basophils Absolute: 0.1 10*3/uL (ref 0.0–0.1)
Basophils Relative: 0 %
Eosinophils Absolute: 0 10*3/uL (ref 0.0–0.5)
Eosinophils Relative: 0 %
HCT: 37 % (ref 36.0–46.0)
Hemoglobin: 10.5 g/dL — ABNORMAL LOW (ref 12.0–15.0)
Immature Granulocytes: 1 %
Lymphocytes Relative: 5 %
Lymphs Abs: 0.7 10*3/uL (ref 0.7–4.0)
MCH: 28.3 pg (ref 26.0–34.0)
MCHC: 28.4 g/dL — ABNORMAL LOW (ref 30.0–36.0)
MCV: 99.7 fL (ref 80.0–100.0)
MONOS PCT: 16 %
Monocytes Absolute: 2.3 10*3/uL — ABNORMAL HIGH (ref 0.1–1.0)
NEUTROS ABS: 11.7 10*3/uL — AB (ref 1.7–7.7)
Neutrophils Relative %: 78 %
Platelets: 325 10*3/uL (ref 150–400)
RBC: 3.71 MIL/uL — ABNORMAL LOW (ref 3.87–5.11)
RDW: 14.1 % (ref 11.5–15.5)
WBC: 14.8 10*3/uL — ABNORMAL HIGH (ref 4.0–10.5)
nRBC: 0 % (ref 0.0–0.2)

## 2018-01-12 LAB — URINALYSIS, ROUTINE W REFLEX MICROSCOPIC
Glucose, UA: NEGATIVE mg/dL
Ketones, ur: NEGATIVE mg/dL
LEUKOCYTES UA: NEGATIVE
Nitrite: NEGATIVE
Protein, ur: >300 mg/dL — AB
Specific Gravity, Urine: >1.03 — ABNORMAL HIGH (ref 1.005–1.030)
pH: 5 (ref 5.0–8.0)

## 2018-01-12 LAB — URINALYSIS, MICROSCOPIC (REFLEX)

## 2018-01-12 LAB — COMPREHENSIVE METABOLIC PANEL
ALT: 15 U/L (ref 0–44)
AST: 20 U/L (ref 15–41)
Albumin: 2.8 g/dL — ABNORMAL LOW (ref 3.5–5.0)
Alkaline Phosphatase: 88 U/L (ref 38–126)
Anion gap: 10 (ref 5–15)
BUN: 5 mg/dL — ABNORMAL LOW (ref 8–23)
CHLORIDE: 92 mmol/L — AB (ref 98–111)
CO2: 36 mmol/L — ABNORMAL HIGH (ref 22–32)
Calcium: 9 mg/dL (ref 8.9–10.3)
Creatinine, Ser: 0.51 mg/dL (ref 0.44–1.00)
GFR calc Af Amer: >60 mL/min (ref >60)
Glucose, Bld: 95 mg/dL (ref 70–99)
Potassium: 3.7 mmol/L (ref 3.5–5.1)
Sodium: 138 mmol/L (ref 135–145)
Total Bilirubin: 0.5 mg/dL (ref 0.3–1.2)
Total Protein: 6.8 g/dL (ref 6.5–8.1)

## 2018-01-12 LAB — BLOOD GAS, ARTERIAL
Acid-Base Excess: 9.8 mmol/L — ABNORMAL HIGH (ref 0.0–2.0)
Acid-Base Excess: 10.9 mmol/L — ABNORMAL HIGH (ref 0.0–2.0)
Bicarbonate: 37.9 mmol/L — ABNORMAL HIGH (ref 20.0–28.0)
Bicarbonate: 38.9 mmol/L — ABNORMAL HIGH (ref 20.0–28.0)
DRAWN BY: 244861
Delivery systems: POSITIVE
Delivery systems: POSITIVE
Drawn by: 244861
Expiratory PAP: 8
Expiratory PAP: 8
FIO2: 50
FIO2: 40
Inspiratory PAP: 16
Inspiratory PAP: 18
O2 SAT: 94.2 %
O2 Saturation: 90.1 %
PH ART: 7.19 — AB (ref 7.350–7.450)
Patient temperature: 98.6
Patient temperature: 98.6
pCO2 arterial: 103 mmHg (ref 32.0–48.0)
pCO2 arterial: 102 mmHg (ref 32.0–48.0)
pH, Arterial: 7.206 — ABNORMAL LOW (ref 7.350–7.450)
pO2, Arterial: 87.8 mmHg (ref 83.0–108.0)
pO2, Arterial: 68.1 mmHg — ABNORMAL LOW (ref 83.0–108.0)

## 2018-01-12 LAB — POCT I-STAT 3, ART BLOOD GAS (G3+)
Acid-Base Excess: 12 mmol/L — ABNORMAL HIGH (ref 0.0–2.0)
Bicarbonate: 42.2 mmol/L — ABNORMAL HIGH (ref 20.0–28.0)
O2 Saturation: 80 %
Patient temperature: 98.2
TCO2: 45 mmol/L — ABNORMAL HIGH (ref 22–32)
pCO2 arterial: 93.1 mmHg (ref 32.0–48.0)
pH, Arterial: 7.263 — ABNORMAL LOW (ref 7.350–7.450)
pO2, Arterial: 53 mmHg — ABNORMAL LOW (ref 83.0–108.0)

## 2018-01-12 LAB — INFLUENZA PANEL BY PCR (TYPE A & B)
Influenza A By PCR: NEGATIVE
Influenza B By PCR: NEGATIVE

## 2018-01-12 LAB — GLUCOSE, CAPILLARY: Glucose-Capillary: 112 mg/dL — ABNORMAL HIGH (ref 70–99)

## 2018-01-12 LAB — MAGNESIUM: Magnesium: 1.5 mg/dL — ABNORMAL LOW (ref 1.7–2.4)

## 2018-01-12 LAB — BRAIN NATRIURETIC PEPTIDE: B Natriuretic Peptide: 261.8 pg/mL — ABNORMAL HIGH (ref 0.0–100.0)

## 2018-01-12 LAB — PROCALCITONIN: Procalcitonin: 0.14 ng/mL

## 2018-01-12 LAB — I-STAT CG4 LACTIC ACID, ED: Lactic Acid, Venous: 0.57 mmol/L (ref 0.5–1.9)

## 2018-01-12 LAB — PHOSPHORUS: Phosphorus: 4.1 mg/dL (ref 2.5–4.6)

## 2018-01-12 MED ORDER — IPRATROPIUM BROMIDE 0.02 % IN SOLN
0.5000 mg | Freq: Once | RESPIRATORY_TRACT | Status: AC
  Administered 2018-01-12: 0.5 mg via RESPIRATORY_TRACT
  Filled 2018-01-12: qty 2.5

## 2018-01-12 MED ORDER — VANCOMYCIN HCL IN DEXTROSE 1-5 GM/200ML-% IV SOLN
1000.0000 mg | Freq: Once | INTRAVENOUS | Status: AC
  Administered 2018-01-12: 1000 mg via INTRAVENOUS
  Filled 2018-01-12: qty 200

## 2018-01-12 MED ORDER — BUDESONIDE 0.5 MG/2ML IN SUSP
0.5000 mg | Freq: Two times a day (BID) | RESPIRATORY_TRACT | Status: DC
  Administered 2018-01-12 – 2018-01-17 (×10): 0.5 mg via RESPIRATORY_TRACT
  Filled 2018-01-12 (×11): qty 2

## 2018-01-12 MED ORDER — SODIUM CHLORIDE 0.9 % IV SOLN
INTRAVENOUS | Status: DC
  Administered 2018-01-12 – 2018-01-14 (×2): via INTRAVENOUS
  Administered 2018-01-15: 1 mL via INTRAVENOUS
  Administered 2018-01-16: 16:00:00 via INTRAVENOUS

## 2018-01-12 MED ORDER — IPRATROPIUM BROMIDE 0.02 % IN SOLN
0.5000 mg | Freq: Once | RESPIRATORY_TRACT | Status: AC
  Administered 2018-01-12: 0.5 mg via RESPIRATORY_TRACT

## 2018-01-12 MED ORDER — HEPARIN SODIUM (PORCINE) 5000 UNIT/ML IJ SOLN
5000.0000 [IU] | Freq: Three times a day (TID) | INTRAMUSCULAR | Status: DC
  Administered 2018-01-12 – 2018-01-17 (×15): 5000 [IU] via SUBCUTANEOUS
  Filled 2018-01-12 (×15): qty 1

## 2018-01-12 MED ORDER — NICOTINE 14 MG/24HR TD PT24
14.0000 mg | MEDICATED_PATCH | Freq: Every day | TRANSDERMAL | Status: DC
  Administered 2018-01-12 – 2018-01-17 (×6): 14 mg via TRANSDERMAL
  Filled 2018-01-12 (×6): qty 1

## 2018-01-12 MED ORDER — IPRATROPIUM-ALBUTEROL 0.5-2.5 (3) MG/3ML IN SOLN
3.0000 mL | RESPIRATORY_TRACT | Status: DC
  Administered 2018-01-12 – 2018-01-17 (×28): 3 mL via RESPIRATORY_TRACT
  Filled 2018-01-12 (×28): qty 3

## 2018-01-12 MED ORDER — SODIUM CHLORIDE 0.9 % IV SOLN
1.0000 g | INTRAVENOUS | Status: AC
  Administered 2018-01-12 – 2018-01-16 (×5): 1 g via INTRAVENOUS
  Filled 2018-01-12 (×5): qty 10

## 2018-01-12 MED ORDER — SODIUM CHLORIDE 0.9 % IV SOLN
1.0000 g | Freq: Three times a day (TID) | INTRAVENOUS | Status: DC
  Filled 2018-01-12 (×2): qty 1

## 2018-01-12 MED ORDER — ALBUTEROL (5 MG/ML) CONTINUOUS INHALATION SOLN
10.0000 mg/h | INHALATION_SOLUTION | RESPIRATORY_TRACT | Status: DC
  Filled 2018-01-12: qty 20

## 2018-01-12 MED ORDER — SODIUM CHLORIDE 0.9 % IV SOLN
500.0000 mg | INTRAVENOUS | Status: AC
  Administered 2018-01-12 – 2018-01-16 (×5): 500 mg via INTRAVENOUS
  Filled 2018-01-12 (×5): qty 500

## 2018-01-12 MED ORDER — ALBUTEROL SULFATE (2.5 MG/3ML) 0.083% IN NEBU
5.0000 mg | INHALATION_SOLUTION | Freq: Once | RESPIRATORY_TRACT | Status: AC
  Administered 2018-01-12: 5 mg via RESPIRATORY_TRACT
  Filled 2018-01-12: qty 6

## 2018-01-12 MED ORDER — VANCOMYCIN HCL IN DEXTROSE 1-5 GM/200ML-% IV SOLN: 1000.0000 mg | INTRAVENOUS | Status: DC

## 2018-01-12 MED ORDER — ARFORMOTEROL TARTRATE 15 MCG/2ML IN NEBU
15.0000 ug | INHALATION_SOLUTION | Freq: Two times a day (BID) | RESPIRATORY_TRACT | Status: DC
  Administered 2018-01-12 – 2018-01-15 (×6): 15 ug via RESPIRATORY_TRACT
  Filled 2018-01-12 (×6): qty 2

## 2018-01-12 MED ORDER — SODIUM CHLORIDE 0.9 % IV SOLN
2.0000 g | Freq: Once | INTRAVENOUS | Status: AC
  Administered 2018-01-12: 2 g via INTRAVENOUS
  Filled 2018-01-12: qty 2

## 2018-01-12 MED ORDER — ALBUTEROL (5 MG/ML) CONTINUOUS INHALATION SOLN
10.0000 mg/h | INHALATION_SOLUTION | Freq: Once | RESPIRATORY_TRACT | Status: AC
  Administered 2018-01-12: 10 mg/h via RESPIRATORY_TRACT
  Filled 2018-01-12: qty 20

## 2018-01-12 MED ORDER — METHYLPREDNISOLONE SODIUM SUCC 40 MG IJ SOLR
40.0000 mg | Freq: Four times a day (QID) | INTRAMUSCULAR | Status: DC
  Administered 2018-01-12 – 2018-01-17 (×20): 40 mg via INTRAVENOUS
  Filled 2018-01-12 (×21): qty 1

## 2018-01-12 MED ORDER — METHYLPREDNISOLONE SODIUM SUCC 125 MG IJ SOLR
125.0000 mg | Freq: Once | INTRAMUSCULAR | Status: AC
  Administered 2018-01-12: 125 mg via INTRAVENOUS
  Filled 2018-01-12: qty 2

## 2018-01-12 NOTE — ED Triage Notes (Signed)
Per [facility]: Patient to ED from home for suspected sepsis - per EMS, family states patient has had decreased activity, altered mental status, lethargy, and worsening cough x 2 days. Hx COPD, chronic bronchitis, and frequent pneumonia. Patient given 5mg  albuterol PTA - productive cough and rhonchi/diminished breath sounds bilaterally. EMS VS: HR 108 sinus tach, 158/78, RR 20, CBG 122. A&O x 2. Skin warm/dry, respirations e/u at rest.

## 2018-01-12 NOTE — Progress Notes (Addendum)
eLink Physician-Brief Progress Note Patient Name: Judith Scott DOB: June 12, 1955 MRN: [id number]   Date of Service  2018/02/06  HPI/Events of Note  62/F with severe COPD, active smoker, admitted for acute COPD exacerbation.   Pt placed on BIPAP with some clinical improvement.   PT started on IV steroids, Azithromycin and Ceftriaxone, neb treatments.  eICU Interventions  Continue on BIPAP. Repeat ABG. Continue IV steroids, antibiotics and neb treatments. NPO.  Heparin for DVT prophylaxis.     Intervention Category Evaluation Type: New Patient Evaluation  Larinda Buttery Feb 06, 2018, 7:18 PM   8:27 PM ABG with some improvement - pCO2 better at 97.  Discussed plan with RT - change BIPAP settings to 20/6 and follow up repeat ABG.

## 2018-01-12 NOTE — H&P (Signed)
NAME:  Wynne DustKathy Milne, MRN:  [id number], DOB:  [birth year], LOS: 0 ADMISSION DATE:  01/26/2018, CONSULTATION DATE:  1/10 REFERRING MD: Denton LankSteinl - EM , CHIEF COMPLAINT:  AMS, SOB  Brief History   Patient is 63 yo F with COPD presenting to ED with AMS and progressive dyspnea. Patient on BiPAP in ED. PCCM consulted for acute on chronic respiratory failure.   History of present illness   Patient is a 63 year old female with PMH COPD, HTN, anemia, Hypercholesteremia, RA who presented to ED via EMS 1/10 with AMS and progressive dyspnea. Prior to presentation the patient endorses associated fever, weakness, and poor PO intake. Timing of symptom onset is unclear.  In ED, the patient started on BiPAP for acute on chronic respiratory failure. Patient has not had fever in ED. RVP and BCx pending.  Past Medical History  COPD HTN Hypercholesteremia RA Anemia  Significant Hospital Events   1/10> admitted   Consults:  PCCM  Procedures:    Significant Diagnostic Tests:  1/10 CXR> hyperinflated lungs, no focal opacity, some interstitial coarseness   Micro Data:   1/10 RVP >> 1/10 Flu A/B> neg 1/10 BCx >> 1/10 UA>>  Antimicrobials:  Cefepime 1/10 Vanc 1/10 Azithromycin 1/10>> Rocephin 1/10>>  Interim history/subjective:  Presented to ED with dyspnea, fever. Started on BiPAP for respiratory failure and PCCM consulted. Will be admitted to ICU  Objective   Blood pressure 128/66, pulse 95, temperature 98.2 F (36.8 C), temperature source Rectal, resp. rate (!) 25, weight 51.3 kg, SpO2 100 %.    FiO2 (%):  [40 %-45 %] 40 %   Intake/Output Summary (Last 24 hours) at 01/03/2018 1709 Last data filed at 01/29/2018 1301 Gross per 24 hour  Intake 300 ml  Output -  Net 300 ml   Filed Weights   01/06/2018 1038  Weight: 51.3 kg    Examination: General: frail appearing older adult female HENT: [place]/AT. BiPAP mask. Trachea midline. Pupils 4mm. Lungs: I/E wheeze, severely diminished bilateral  lung sounds Cardiovascular: RRR, distant heart sounds,  no JVD 1+ radial pulses Abdomen: soft, non-distended, non-tender. Normoactive bowel sounds Extremities: Symmetrical bulk and tone. No pedal edema Neuro: Drowsy. Awakens to loud voice, noxious stimuli. Moved BUE BLE spontaneously. Does not follow commands Skin: pale, clean, dry, carm, intact  Resolved Hospital Problem list     Assessment & Plan:    Acute on chronic respiratory failure -COPD exacerbation vs PNA -associated respiratory acidosis with some improvement on follow up ABG P -Continue BiPAP  -Titrate for goal SpO2 88-92% -Solumedrol 40mg  IV q6hr -Pulmicort, Brovanna, duonebs -Start azithromycin, rocephin -Trend WBC -Follow up outstanding Respiratory viral panel -PCT -Repeat ABG if worsening mental status  Leukocytosis -COPD exacerbation vs UTI vs bloodstream infection -Chronic immunosuppression  -Received 1x Vanc/cefepime in ED P -follow up BCx, UA/UCx, RVP -Starting azithromycin, rocephin  Hyperglycemia, at risk -starting steroids, possible infectious process P -Monitor, start SSI if needed  Electrolyte abnormality, at risk P -Follow BMP, mag, phos -replace PRN  Tobacco use disorder P -Nicotine patch  RA P Holding home immunosuppression  HTN P Holding home regimen at this time  Best practice:  Diet: NPO Pain/Anxiety/Delirium protocol (if indicated):  VAP protocol (if indicated): n/a DVT prophylaxis: heparin GI prophylaxis: n/a Glucose control: none Mobility: bedrest Code Status: Full Family Communication: no family at bedside Disposition: Admit to ICU  Labs   CBC: Recent Labs  Lab 01/26/2018 1054  WBC 14.8*  NEUTROABS 11.7*  HGB 10.5*  HCT 37.0  MCV 99.7  PLT 325    Basic Metabolic Panel: Recent Labs  Lab 26-Jan-2018 1054  NA 138  K 3.7  CL 92*  CO2 36*  GLUCOSE 95  BUN 5*  CREATININE 0.51  CALCIUM 9.0   GFR: Estimated Creatinine Clearance: 57.5 mL/min (by C-G  formula based on SCr of 0.51 mg/dL). Recent Labs  Lab January 26, 2018 1054 2018-01-26 1102  WBC 14.8*  --   LATICACIDVEN  --  0.57    Liver Function Tests: Recent Labs  Lab Jan 26, 2018 1054  AST 20  ALT 15  ALKPHOS 88  BILITOT 0.5  PROT 6.8  ALBUMIN 2.8*   No results for input(s): LIPASE, AMYLASE in the last 168 hours. No results for input(s): AMMONIA in the last 168 hours.  ABG    Component Value Date/Time   PHART 7.206 (L) Jan 26, 2018 1521   PCO2ART 102 (HH) 26-Jan-2018 1521   PO2ART 68.1 (L) 26-Jan-2018 1521   HCO3 38.9 (H) Jan 26, 2018 1521   TCO2 >50 (H) 10/31/2017 1725   O2SAT 90.1 January 26, 2018 1521     Coagulation Profile: No results for input(s): INR, PROTIME in the last 168 hours.  Cardiac Enzymes: No results for input(s): CKTOTAL, CKMB, CKMBINDEX, TROPONINI in the last 168 hours.  HbA1C: No results found for: HGBA1C  CBG: No results for input(s): GLUCAP in the last 168 hours.  Review of Systems:   Unable to obtain due to altered mental status  Past Medical History  She,  has a past medical history of COPD (chronic obstructive pulmonary disease) (HCC), COPD exacerbation (HCC) (05/25/2012), Hypercholesteremia, and Hypertension.   Surgical History    Past Surgical History:  Procedure Laterality Date  . APPENDECTOMY       Social History   reports that she has been smoking cigarettes. She has a 60.00 pack-year smoking history. She has never used smokeless tobacco. She reports current alcohol use. She reports that she does not use drugs.   Family History   Her family history includes Lung cancer in her mother.   Allergies No Known Allergies   Home Medications  Prior to Admission medications   Medication Sig Start Date End Date Taking? Authorizing Provider  albuterol (PROVENTIL HFA;VENTOLIN HFA) 108 (90 Base) MCG/ACT inhaler Inhale 2 puffs into the lungs every 4 (four) hours as needed for wheezing or shortness of breath. 03/06/17   Dolores Patty C, DO  albuterol  (PROVENTIL) (2.5 MG/3ML) 0.083% nebulizer solution Take 3 mLs (2.5 mg total) by nebulization every 6 (six) hours as needed for wheezing or shortness of breath. 05/25/17   Casey Burkitt, MD  atorvastatin (LIPITOR) 80 MG tablet TAKE 1 TABLET (80 MG TOTAL) BY MOUTH DAILY AT 6 PM. Patient taking differently: Take 80 mg by mouth daily.  08/14/17   Tillman Sers, DO  budesonide-formoterol (SYMBICORT) 160-4.5 MCG/ACT inhaler Inhale 2 puffs into the lungs 2 (two) times daily. 12/04/17   Tillman Sers, DO  etanercept (ENBREL) 25 MG injection Inject 25 mg into the skin once a week.    [provider]  ferrous sulfate 325 (65 FE) MG tablet TAKE 1 TABLET BY MOUTH EVERY OTHER DAY 12/25/17   Diallo, Lilia Argue, MD  furosemide (LASIX) 20 MG tablet Take 1 tablet (20 mg total) by mouth daily as needed for edema. Patient not taking: Reported on 10/31/2017 01/19/17   Almon Hercules, MD  lactose free nutrition (BOOST PLUS) LIQD Take 237 mLs by mouth 3 (three) times daily with meals. 11/02/17   Primitivo Gauze,  Gerilyn Pilgrim, MD  leflunomide (ARAVA) 20 MG tablet Take 20 mg by mouth daily. 02/01/16   [provider]  losartan (COZAAR) 25 MG tablet Take 50 mg by mouth daily. 09/22/17   [provider]  nicotine (NICODERM CQ - DOSED IN MG/24 HOURS) 14 mg/24hr patch Place 1 patch (14 mg total) onto the skin daily. 11/09/17   Mirian Mo, MD  nicotine (NICODERM CQ - DOSED IN MG/24 HOURS) 21 mg/24hr patch Place 1 patch (21 mg total) onto the skin daily. 11/03/17   Myrene Buddy, MD  nicotine (NICODERM CQ - DOSED IN MG/24 HR) 7 mg/24hr patch Place 1 patch (7 mg total) onto the skin daily. 11/09/17   Mirian Mo, MD  OXYGEN 3lpm with sleep and as needed during the day    [provider]  tiotropium (SPIRIVA HANDIHALER) 18 MCG inhalation capsule Place 1 capsule (18 mcg total) into inhaler and inhale daily. 04/25/17   Tillman Sers, DO     Critical care time: 30 min     Tessie Fass MSN,  AGACNP-BC [facility] Pulmonary/Critical Care Medicine January 14, 2018, 5:42 PM

## 2018-01-12 NOTE — Progress Notes (Signed)
RT NOTE:  Critical ISTAT ABG results reports to [facility] MD @ 20:25 & 21:45

## 2018-01-12 NOTE — Progress Notes (Signed)
RT note: RT and RN transported patient on BIPAP to 2M10. Vital signs stable through out.

## 2018-01-12 NOTE — Progress Notes (Signed)
RT reported ABG resuls of ph 7.19/ PCO2 103/ PAO2 87/ HCO 37 to DR Steinl. RT increased BIPAP ipap to 18.

## 2018-01-12 NOTE — ED Provider Notes (Addendum)
[facility] Provider Note   CSN: [id number] Arrival date & time: 02/02/2018  1028     History   Chief Complaint Chief Complaint  Patient presents with  . Altered Mental Status    HPI Judith Scott is a 63 y.o. female.  Patient presents via EMS with fever to 103, generalized weakness, poor po intake, altered mental status, and increased trouble breathing. Symptom duration unclear - pt very limited historian - level 5 caveat - altered mental status/confusion. No report of trauma/fall.   The history is provided by the patient and the EMS personnel. The history is limited by the condition of the patient.  Altered Mental Status  Associated symptoms: fever     Past Medical History:  Diagnosis Date  . COPD (chronic obstructive pulmonary disease) (HCC)   . COPD exacerbation (HCC) 05/25/2012  . Hypercholesteremia   . Hypertension     Patient Active Problem List   Diagnosis Date Noted  . Iron deficiency anemia 11/09/2017  . Pressure injury of skin 11/01/2017  . Protein-calorie malnutrition, severe 11/01/2017  . Acute on chronic congestive heart failure (HCC)   . Community acquired pneumonia of left lower lobe of lung (HCC)   . Cough 05/28/2017  . Leg swelling 05/28/2017  . Diarrhea 05/28/2017  . Current chronic use of systemic steroids 01/26/2017  . Bilateral leg edema 01/26/2017  . Rheumatoid arthritis (HCC) 01/20/2017  . Healthcare maintenance 07/22/2016  . Sciatic nerve pain, right 05/17/2016  . Dyspnea on exertion 02/09/2016  . Wrist arthralgia 09/02/2015  . Cigarette smoker 12/21/2013  . COPD GOLD III  10/23/2013  . Lung nodule 10/23/2013  . Hyperlipidemia 10/13/2013  . Hyponatremia 10/13/2013  . Dehydration 10/13/2013  . Essential hypertension, benign 02/15/2013  . Tobacco abuse 05/25/2012  . Chronic respiratory failure with hypoxia (HCC) 05/25/2012    Past Surgical History:  Procedure Laterality Date  . APPENDECTOMY        OB History   No obstetric history on file.      Home Medications    Prior to Admission medications   Medication Sig Start Date End Date Taking? Authorizing Provider  albuterol (PROVENTIL HFA;VENTOLIN HFA) 108 (90 Base) MCG/ACT inhaler Inhale 2 puffs into the lungs every 4 (four) hours as needed for wheezing or shortness of breath. 03/06/17   Dolores Pattyiccio, Angela C, DO  albuterol (PROVENTIL) (2.5 MG/3ML) 0.083% nebulizer solution Take 3 mLs (2.5 mg total) by nebulization every 6 (six) hours as needed for wheezing or shortness of breath. 05/25/17   Casey BurkittFitzgerald, Hillary Moen, MD  atorvastatin (LIPITOR) 80 MG tablet TAKE 1 TABLET (80 MG TOTAL) BY MOUTH DAILY AT 6 PM. Patient taking differently: Take 80 mg by mouth daily.  08/14/17   Tillman Sersiccio, Angela C, DO  budesonide-formoterol (SYMBICORT) 160-4.5 MCG/ACT inhaler Inhale 2 puffs into the lungs 2 (two) times daily. 12/04/17   Tillman Sersiccio, Angela C, DO  etanercept (ENBREL) 25 MG injection Inject 25 mg into the skin once a week.    [provider]  ferrous sulfate 325 (65 FE) MG tablet TAKE 1 TABLET BY MOUTH EVERY OTHER DAY 12/25/17   Diallo, Lilia ArgueAbdoulaye, MD  furosemide (LASIX) 20 MG tablet Take 1 tablet (20 mg total) by mouth daily as needed for edema. Patient not taking: Reported on 10/31/2017 01/19/17   Almon HerculesGonfa, Taye T, MD  lactose free nutrition (BOOST PLUS) LIQD Take 237 mLs by mouth 3 (three) times daily with meals. 11/02/17   Myrene BuddyFletcher, Jacob, MD  leflunomide (ARAVA) 20  MG tablet Take 20 mg by mouth daily. 02/01/16   [provider]  losartan (COZAAR) 25 MG tablet Take 50 mg by mouth daily. 09/22/17   [provider]  nicotine (NICODERM CQ - DOSED IN MG/24 HOURS) 14 mg/24hr patch Place 1 patch (14 mg total) onto the skin daily. 11/09/17   Mirian Mo, MD  nicotine (NICODERM CQ - DOSED IN MG/24 HOURS) 21 mg/24hr patch Place 1 patch (21 mg total) onto the skin daily. 11/03/17   Myrene Buddy, MD  nicotine (NICODERM CQ - DOSED IN MG/24  HR) 7 mg/24hr patch Place 1 patch (7 mg total) onto the skin daily. 11/09/17   Mirian Mo, MD  OXYGEN 3lpm with sleep and as needed during the day    [provider]  tiotropium (SPIRIVA HANDIHALER) 18 MCG inhalation capsule Place 1 capsule (18 mcg total) into inhaler and inhale daily. 04/25/17   Tillman Sers, DO    Family History Family History  Problem Relation Age of Onset  . 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 Smoking status: Current Every Day Smoker    Packs/day: 1.50    Years: 40.00    Pack years: 60.00    Types: Cigarettes  . Smokeless tobacco: Never Used  Substance Use Topics  . Alcohol use: Yes    Alcohol/week: 0.0 standard drinks    Comment: occasional  . Drug use: No     Allergies   Patient has no known allergies.   Review of Systems Review of Systems  Unable to perform ROS: Mental status change  Constitutional: Positive for fever.  level 5 caveat - altered ms, poorly responsive.     Physical Exam Updated Vital Signs BP (!) 148/60 (BP Location: Right Arm)   Pulse 98   Resp (!) 27   Wt 51.3 kg   SpO2 100%   BMI 19.41 kg/m   Physical Exam Vitals signs and nursing note reviewed.  Constitutional:      General: She is in acute distress.     Appearance: Normal appearance. She is well-developed. She is ill-appearing.  HENT:     Head: Atraumatic.     Nose: Nose normal.     Mouth/Throat:     Mouth: Mucous membranes are moist.  Eyes:     General: No scleral icterus.    Conjunctiva/sclera: Conjunctivae normal.     Pupils: Pupils are equal, round, and reactive to light.  Neck:     Musculoskeletal: Normal range of motion and neck supple. No neck rigidity or muscular tenderness.     Trachea: No tracheal deviation.     Comments: No stiffness or rigidity Cardiovascular:     Rate and Rhythm: Normal rate and regular rhythm.     Pulses: Normal pulses.     Heart  sounds: Normal heart sounds. No murmur. No friction rub. No gallop.   Pulmonary:     Effort: Respiratory distress present.     Breath sounds: Wheezing and rales present.  Abdominal:     General: Bowel sounds are normal. There is no distension.     Palpations: Abdomen is soft.     Tenderness: There is no abdominal tenderness. There is no guarding.  Genitourinary:    Comments: No cva tenderness.  Musculoskeletal:        General: No swelling or tenderness.  Skin:    General: Skin  is warm and dry.     Findings: No rash.  Neurological:     Mental Status: She is alert.     Comments: Awake, very slow to respond. Moves bil extremities purposefully.   Psychiatric:     Comments: Mildly lethargic, decreased responsiveness.       ED Treatments / Results  Labs (all labs ordered are listed, but only abnormal results are displayed) Results for orders placed or performed during the hospital encounter of 06-18-18  Comprehensive metabolic panel  Result Value Ref Range   Sodium 138 135 - 145 mmol/L   Potassium 3.7 3.5 - 5.1 mmol/L   Chloride 92 (L) 98 - 111 mmol/L   CO2 36 (H) 22 - 32 mmol/L   Glucose, Bld 95 70 - 99 mg/dL   BUN 5 (L) 8 - 23 mg/dL   Creatinine, Ser 1.610.51 0.44 - 1.00 mg/dL   Calcium 9.0 8.9 - 09.610.3 mg/dL   Total Protein 6.8 6.5 - 8.1 g/dL   Albumin 2.8 (L) 3.5 - 5.0 g/dL   AST 20 15 - 41 U/L   ALT 15 0 - 44 U/L   Alkaline Phosphatase 88 38 - 126 U/L   Total Bilirubin 0.5 0.3 - 1.2 mg/dL   GFR calc non Af Amer >60 >60 mL/min   GFR calc Af Amer >60 >60 mL/min   Anion gap 10 5 - 15  CBC WITH DIFFERENTIAL  Result Value Ref Range   WBC 14.8 (H) 4.0 - 10.5 K/uL   RBC 3.71 (L) 3.87 - 5.11 MIL/uL   Hemoglobin 10.5 (L) 12.0 - 15.0 g/dL   HCT 04.537.0 40.936.0 - 81.146.0 %   MCV 99.7 80.0 - 100.0 fL   MCH 28.3 26.0 - 34.0 pg   MCHC 28.4 (L) 30.0 - 36.0 g/dL   RDW 91.414.1 78.211.5 - 95.615.5 %   Platelets 325 150 - 400 K/uL   nRBC 0.0 0.0 - 0.2 %   Neutrophils Relative % 78 %   Neutro Abs 11.7  (H) 1.7 - 7.7 K/uL   Lymphocytes Relative 5 %   Lymphs Abs 0.7 0.7 - 4.0 K/uL   Monocytes Relative 16 %   Monocytes Absolute 2.3 (H) 0.1 - 1.0 K/uL   Eosinophils Relative 0 %   Eosinophils Absolute 0.0 0.0 - 0.5 K/uL   Basophils Relative 0 %   Basophils Absolute 0.1 0.0 - 0.1 K/uL   Immature Granulocytes 1 %   Abs Immature Granulocytes 0.11 (H) 0.00 - 0.07 K/uL  Influenza panel by PCR (type A & B)  Result Value Ref Range   Influenza A By PCR NEGATIVE NEGATIVE   Influenza B By PCR NEGATIVE NEGATIVE  Blood gas, arterial  Result Value Ref Range   FIO2 50.00    Delivery systems BILEVEL POSITIVE AIRWAY PRESSURE    Inspiratory PAP 16.0    Expiratory PAP 8.0    pH, Arterial 7.190 (LL) 7.350 - 7.450   pCO2 arterial 103 (HH) 32.0 - 48.0 mmHg   pO2, Arterial 87.8 83.0 - 108.0 mmHg   Bicarbonate 37.9 (H) 20.0 - 28.0 mmol/L   Acid-Base Excess 9.8 (H) 0.0 - 2.0 mmol/L   O2 Saturation 94.2 %   Patient temperature 98.6    Collection site RIGHT RADIAL    Drawn by [phone]    Sample type ARTERIAL DRAW    Allens test (pass/fail) PASS PASS  Blood gas, arterial  Result Value Ref Range   FIO2 40.00    Delivery systems BILEVEL POSITIVE  AIRWAY PRESSURE    Inspiratory PAP 18    Expiratory PAP 8    pH, Arterial 7.206 (L) 7.350 - 7.450   pCO2 arterial 102 (HH) 32.0 - 48.0 mmHg   pO2, Arterial 68.1 (L) 83.0 - 108.0 mmHg   Bicarbonate 38.9 (H) 20.0 - 28.0 mmol/L   Acid-Base Excess 10.9 (H) 0.0 - 2.0 mmol/L   O2 Saturation 90.1 %   Patient temperature 98.6    Collection site RIGHT RADIAL    Drawn by [phone]    Sample type ARTERIAL DRAW    Allens test (pass/fail) PASS PASS  I-Stat CG4 Lactic Acid, ED  Result Value Ref Range   Lactic Acid, Venous 0.57 0.5 - 1.9 mmol/L   Dg Chest Port 1 View  Result Date: 2018-01-24 CLINICAL DATA:  Fever and dyspnea EXAM: PORTABLE CHEST 1 VIEW COMPARISON:  10/31/2017 FINDINGS: Hyperinflation with generalized interstitial coarsening that is increased from prior.  No focal airspace opacity. Stable normal heart size. Negative mediastinal contours. IMPRESSION: There is generalized interstitial coarsening above prior baseline which could be acute bronchitis or vascular congestion. COPD. Electronically Signed   By: Marnee Spring M.D.   On: 01/24/2018 11:45    EKG EKG Interpretation  Date/Time:  Friday 01-24-2018 10:36:57 EST Ventricular Rate:  97 PR Interval:    QRS Duration: 92 QT Interval:  336 QTC Calculation: 427 R Axis:   102 Text Interpretation:  Sinus rhythm Borderline short PR interval Right axis deviation Nonspecific ST abnormality Baseline wander Confirmed by Cathren Laine ([id number]) on 24-Jan-2018 10:59:55 AM   Radiology Dg Chest Port 1 View  Result Date: 01/24/2018 CLINICAL DATA:  Fever and dyspnea EXAM: PORTABLE CHEST 1 VIEW COMPARISON:  10/31/2017 FINDINGS: Hyperinflation with generalized interstitial coarsening that is increased from prior. No focal airspace opacity. Stable normal heart size. Negative mediastinal contours. IMPRESSION: There is generalized interstitial coarsening above prior baseline which could be acute bronchitis or vascular congestion. COPD. Electronically Signed   By: Marnee Spring M.D.   On: 2018-01-24 11:45    Procedures Procedures (including critical care time)  Medications Ordered in ED Medications  ceFEPIme (MAXIPIME) 2 g in sodium chloride 0.9 % 100 mL IVPB (has no administration in time range)  vancomycin (VANCOCIN) IVPB 1000 mg/200 mL premix (has no administration in time range)  albuterol (PROVENTIL) (2.5 MG/3ML) 0.083% nebulizer solution 5 mg (5 mg Nebulization Given 01/24/2018 1043)  ipratropium (ATROVENT) nebulizer solution 0.5 mg (0.5 mg Nebulization Given 01/24/18 1043)     Initial Impression / Assessment and Plan / ED Course  I have reviewed the triage vital signs and the nursing notes.  Pertinent labs & imaging results that were available during my care of the patient were reviewed by me and  considered in my medical decision making (see chart for details).  Iv ns. Continuous pulse ox and monitor. o2 [place]. On normal 3 liters, sats 80 - o2 mask - sats 91%.  Code sepsis activated. Ns bolus.   Cultures sent. 2nd iv line. Albuterol and atrovent neb.  Iv abx given. Cxr.   Reviewed nursing notes and prior charts for additional history.   Discussed possible need intubation/vent with patient, pt agreeable w bipap trial, expresses desire to do everything possible to avoid intubation.   Abg added to labs, and bipap ordered. resp therapy consulted.   Additional neb tx.   Labs/abg reviewed - progressive resp failure, markedly elevated pco2 - from prior abgs pco2 chronically elevated, in mid 80s, and hc03 very high  on baseline chem panel - however pco2 on abg worse today. Will give trial bipap and monitor for need for intubation.  cxr reviewed - no def pna.   With trial bipap, ph mildly improved, and pt is much more alert/responsive than on previous check of pt at time initial abg returned. As pt appears to be making slow progress, and very much requested to avoid intubation, will try to work with bipap, resp therapy adjusted settings, additional neb, pt repositioned in bed/more upright. Given tenuous resp status, will consult critical care team for admission.  CRITICAL CARE  RE acute on chronic respiratory failure, severe copd.  Performed by: Suzi Roots Total critical care time: 140 minutes Critical care time was exclusive of separately billable procedures and treating other patients. Critical care was necessary to treat or prevent imminent or life-threatening deterioration. Critical care was time spent personally by me on the following activities: development of treatment plan with patient and/or surrogate as well as nursing, discussions with consultants, evaluation of patient's response to treatment, examination of patient, obtaining history from patient or surrogate, ordering and  performing treatments and interventions, ordering and review of laboratory studies, ordering and review of radiographic studies, pulse oximetry and re-evaluation of patient's condition.  Discussed pt, abgs with Dr Kendrick Fries - he indicates they will see patient in next few minutes, and decide on whether we will intubate/vent vs additional trial of bipap. Signed out plan to Dr Pickering/ED resident.       Final Clinical Impressions(s) / ED Diagnoses   Final diagnoses:  None    ED Discharge Orders    None        Cathren Laine, MD January 13, 2018 [phone]

## 2018-01-12 NOTE — Progress Notes (Signed)
Pharmacy Antibiotic Note  Judith Scott is a 63 y.o. female admitted on 2018/02/11 with AMS, fever, generalized weakness, poor PO intake and SOB.  Pharmacy has been consulted for vancomycin and cefepime dosing for sepsis.  First doses of antibiotics already ordered.  SCr 0.51, CrCL 58 ml/min, afebrile here, WBC 14.8, LA 0.57.  Plan: Vanc 1gm IV Q24H for AUC 519 using SCr 0.8 Cefepime 2gm IV x 1, then 1gm IV Q8H Monitor renal fxn, clinical progress, vanc AUC as indicated   Weight: 113 lb 1.5 oz (51.3 kg)  Temp (24hrs), Avg:98.2 F (36.8 C), Min:98.2 F (36.8 C), Max:98.2 F (36.8 C)  Recent Labs  Lab 2018/02/11 1054 02/11/18 1102  WBC 14.8*  --   CREATININE 0.51  --   LATICACIDVEN  --  0.57    Estimated Creatinine Clearance: 57.5 mL/min (by C-G formula based on SCr of 0.51 mg/dL).    No Known Allergies  Vanc 1/10 >> Cefepime 1/10 >>  1/10 BCx -   Dreana Britz D. Laney Potash, PharmD, BCPS, BCCCP February 11, 2018, 12:40 PM

## 2018-01-12 NOTE — ED Notes (Signed)
Admitting MD at bedside.

## 2018-01-13 LAB — RESPIRATORY PANEL BY PCR

## 2018-01-13 LAB — POCT I-STAT 3, ART BLOOD GAS (G3+)
Acid-Base Excess: 14 mmol/L — ABNORMAL HIGH (ref 0.0–2.0)
Bicarbonate: 43.5 mmol/L — ABNORMAL HIGH (ref 20.0–28.0)
O2 SAT: 94 %
PCO2 ART: 87.3 mmHg — AB (ref 32.0–48.0)
TCO2: 46 mmol/L — ABNORMAL HIGH (ref 22–32)
pH, Arterial: 7.302 — ABNORMAL LOW (ref 7.350–7.450)
pO2, Arterial: 80 mmHg — ABNORMAL LOW (ref 83.0–108.0)

## 2018-01-13 LAB — CBC
HEMATOCRIT: 31.9 % — AB (ref 36.0–46.0)
Hemoglobin: 9.2 g/dL — ABNORMAL LOW (ref 12.0–15.0)
MCH: 28.8 pg (ref 26.0–34.0)
MCHC: 28.8 g/dL — ABNORMAL LOW (ref 30.0–36.0)
MCV: 100 fL (ref 80.0–100.0)
Platelets: 313 10*3/uL (ref 150–400)
RBC: 3.19 MIL/uL — ABNORMAL LOW (ref 3.87–5.11)
RDW: 14 % (ref 11.5–15.5)
WBC: 10.4 10*3/uL (ref 4.0–10.5)
nRBC: 0 % (ref 0.0–0.2)

## 2018-01-13 LAB — PHOSPHORUS: Phosphorus: 3 mg/dL (ref 2.5–4.6)

## 2018-01-13 LAB — GLUCOSE, CAPILLARY
Glucose-Capillary: 118 mg/dL — ABNORMAL HIGH (ref 70–99)
Glucose-Capillary: 128 mg/dL — ABNORMAL HIGH (ref 70–99)

## 2018-01-13 LAB — MRSA PCR SCREENING: MRSA by PCR: NEGATIVE

## 2018-01-13 MED ORDER — HALOPERIDOL LACTATE 5 MG/ML IJ SOLN
1.0000 mg | INTRAMUSCULAR | Status: DC | PRN
  Administered 2018-01-13: 2 mg via INTRAVENOUS
  Administered 2018-01-16: 1 mg via INTRAVENOUS
  Filled 2018-01-13 (×2): qty 1

## 2018-01-13 MED ORDER — MAGNESIUM SULFATE 2 GM/50ML IV SOLN
2.0000 g | Freq: Once | INTRAVENOUS | Status: AC
  Administered 2018-01-13: 2 g via INTRAVENOUS
  Filled 2018-01-13: qty 50

## 2018-01-13 NOTE — Progress Notes (Addendum)
63 year old woman with rheumatoid arthritis and severe COPD admitted 1/10 with respiratory distress and fever, requiring BiPAP for acute on chronic respiratory acidosis FEV1 28% in 2015  ABG seems to have improved from 7.19/103  To 7.30/87 this morning. She is able to speak through the BiPAP full facemask in full sentences, does have mild accessory muscle use, BiPAP 22/8, 45% FiO2 Decreased breath sounds bilateral with faint expiratory rhonchi, S1-S2 normal, no asterixis, frail cachectic appearing woman, soft nontender abdomen.  Labs reviewed, ABG as well, magnesium 1.5, normal BNP, no leukocytosis, low procalcitonin, mild anemia.  Chest x-ray 1/10 personally reviewed which shows hyperinflation consistent with COPD, no infiltrates.  Impression/plan  Acute hypercarbic respiratory failure-ABG is certainly improved as has mental status, we trialed her off BiPAP and she did not tolerate, will decrease IPAP to 18 for comfort as long as tidal volume is 400 or so, and trial breaks on BiPAP every 4 hours. We will maintain nocturnal BiPAP Remains high risk for intubation  COPD exacerbation-continue IV Solu-Medrol and bronchodilators at current doses, respiratory viral panel pending, she is on empiric antibiotics which we can consider stopping once she makes clinical progress  Hypomagnesemia will be repleted No family available for goals of care discussion-she does seem to have end-stage COPD and runs the risk of prolonged mechanical ventilation if intubated  My critical care time x 32 minutes  Cyril Mourning MD. FCCP. [facility] Pager [phone] If no response call [phone]   01/13/2018

## 2018-01-13 NOTE — Progress Notes (Signed)
Patient was trialed off the NIV on a N/C. The patient did not tolerate this and was placed back on the BIPAP at this time due to desaturation and increased WOB.

## 2018-01-13 NOTE — Progress Notes (Signed)
eLink Physician-Brief Progress Note Patient Name: Judith Scott DOB: Nov 07, 1955 MRN: [id number]   Date of Service  01/13/2018  HPI/Events of Note  Follow up ABG shows improvement in respiratory acidosis.   ABG 7.3/87.3/80 on current BIPAP settings <-- 7.19/103/87.8  eICU Interventions  Continue BIPAP now.  Trial off BIPAP in the morning.      Intervention Category Major Interventions: Hypercarbia - evaluation and management  Larinda Buttery 01/13/2018, 3:59 AM

## 2018-01-14 DIAGNOSIS — G9340 Encephalopathy, unspecified: Secondary | ICD-10-CM

## 2018-01-14 LAB — BASIC METABOLIC PANEL
Anion gap: 10 (ref 5–15)
BUN: 21 mg/dL (ref 8–23)
CO2: 35 mmol/L — ABNORMAL HIGH (ref 22–32)
Calcium: 8.6 mg/dL — ABNORMAL LOW (ref 8.9–10.3)
Chloride: 97 mmol/L — ABNORMAL LOW (ref 98–111)
Creatinine, Ser: 0.57 mg/dL (ref 0.44–1.00)
GFR calc Af Amer: >60 mL/min (ref >60)
GFR calc non Af Amer: >60 mL/min (ref >60)
Glucose, Bld: 115 mg/dL — ABNORMAL HIGH (ref 70–99)
POTASSIUM: 3.9 mmol/L (ref 3.5–5.1)
Sodium: 142 mmol/L (ref 135–145)

## 2018-01-14 LAB — CBC
HCT: 29 % — ABNORMAL LOW (ref 36.0–46.0)
Hemoglobin: 8.4 g/dL — ABNORMAL LOW (ref 12.0–15.0)
MCH: 28.2 pg (ref 26.0–34.0)
MCHC: 29 g/dL — ABNORMAL LOW (ref 30.0–36.0)
MCV: 97.3 fL (ref 80.0–100.0)
Platelets: 305 10*3/uL (ref 150–400)
RBC: 2.98 MIL/uL — AB (ref 3.87–5.11)
RDW: 14.1 % (ref 11.5–15.5)
WBC: 6.2 10*3/uL (ref 4.0–10.5)
nRBC: 0 % (ref 0.0–0.2)

## 2018-01-14 LAB — PHOSPHORUS: Phosphorus: 2.7 mg/dL (ref 2.5–4.6)

## 2018-01-14 LAB — MAGNESIUM: Magnesium: 2.1 mg/dL (ref 1.7–2.4)

## 2018-01-14 NOTE — Progress Notes (Signed)
NAME:  Judith Scott, MRN:  [id number], DOB:  [birth year], LOS: 2 ADMISSION DATE:  01/22/2018, CONSULTATION DATE:  1/10 REFERRING MD: Denton Lank - EM , CHIEF COMPLAINT:  AMS, SOB  Brief History   63 yo woman with COPD presented 1/10 with AMS and progressive dyspnea. She had associated fever, weakness and reduced PO intake.   She was noted to be in acute on chronic respiratory failure. RVP and sputum cultures were collected.    She was treated with solu medrol and bronchodilators and required noninvasive mechanical ventilation, she was a high risk for intubation so PCCM was consulted for admission.    Past Medical History  COPD, HTN, anemia, Hypercholesteremia, RA   Significant Hospital Events   1/10 > admission   Consults:  PCCM   Procedures:    Significant Diagnostic Tests:  1/10 CXR> hyperinflated lungs, no focal opacity, some interstitial coarseness   Micro Data:  1/10 Blood cultures > NGTD  1/9 sputum > moderate GPC in pairs and chains, rare GPR on gram stain but cultre consistent with normal respiratory flora  1/10 RVP neg MRSA neg  Antimicrobials:  Cefepime 1/10 Vanc 1/10 Azithromycin 1/10>> Rocephin 1/10>>  Interim history/subjective:  Attempted wean off of Bipap this morning SpO2 dropped below 70% and the bipap was placed back on   Objective   Blood pressure (!) 160/77, pulse 92, temperature 98.2 F (36.8 C), temperature source Oral, resp. rate 18, weight 48.8 kg, SpO2 100 %.    FiO2 (%):  [40 %-45 %] 40 %   Intake/Output Summary (Last 24 hours) at 01/14/2018 4469 Last data filed at 01/14/2018 0500 Gross per 24 hour  Intake 567.3 ml  Output 440 ml  Net 127.3 ml   Filed Weights   01/04/2018 1038 01/13/18 0217 01/14/18 0500  Weight: 51.3 kg 48.3 kg 48.8 kg    Examination: General: not in acute distress  HENT: Bipap in place  Lungs: rattling chest sounds which match with the vibration of her mask, normal work of breathing  Cardiovascular: regular rate and  rhythm, no murmur  Abdomen: soft, non tender, non distended Extremities: no peripheral edema  Neuro: awake and alert, conversational  Resolved Hospital Problem list     Assessment & Plan:   63 yo woman with Hx COPD presenting with acute hypercarbic respiratory failure found to be in a COPD exacerbation.   COPD exacerbation  Acute hypercarbic respiratory failure  Trial of Bipap wean yesterday was not tolerated so she attempted Bipap every 4 hours and nocturnal.  - PCT 0.14  _ RVP neg - taper IV solumedrol to 40 mg q6h to po prednisone when off of bipap - continue Brovana, pulmicort, scheduled duoneb - continue CTX and azithromycin   Hypomagnesemia  - resolved   Tobacco use  - cont nicotine patch   RA  - continue to hold home immunosuppresion   HTN  - holding home meds   Best practice:  Diet: Npo Pain/Anxiety/Delirium protocol (if indicated): N/A VAP protocol (if indicated): N/A DVT prophylaxis: subq Heparin  GI prophylaxis: NA Glucose control: NA Mobility: bedrest Code Status: full Family Communication: none at bedside  Disposition:   Labs   CBC: Recent Labs  Lab 01/21/2018 1054 01/13/18 0227 01/14/18 0334  WBC 14.8* 10.4 6.2  NEUTROABS 11.7*  --   --   HGB 10.5* 9.2* 8.4*  HCT 37.0 31.9* 29.0*  MCV 99.7 100.0 97.3  PLT 325 313 305    Basic Metabolic Panel: Recent Labs  Lab 01/16/2018  1054 01/05/2018 2005 01/13/18 0227 01/14/18 0334  NA 138  --   --  142  K 3.7  --   --  3.9  CL 92*  --   --  97*  CO2 36*  --   --  35*  GLUCOSE 95  --   --  115*  BUN 5*  --   --  21  CREATININE 0.51  --   --  0.57  CALCIUM 9.0  --   --  8.6*  MG  --  1.5*  --  2.1  PHOS  --  4.1 3.0 2.7   GFR: Estimated Creatinine Clearance: 54.7 mL/min (by C-G formula based on SCr of 0.57 mg/dL). Recent Labs  Lab 01/04/2018 1054 01/26/2018 1102 01/03/2018 2005 01/13/18 0227 01/14/18 0334  PROCALCITON  --   --  0.14  --   --   WBC 14.8*  --   --  10.4 6.2  LATICACIDVEN  --   0.57  --   --   --     Liver Function Tests: Recent Labs  Lab 01/18/2018 1054  AST 20  ALT 15  ALKPHOS 88  BILITOT 0.5  PROT 6.8  ALBUMIN 2.8*   No results for input(s): LIPASE, AMYLASE in the last 168 hours. No results for input(s): AMMONIA in the last 168 hours.  ABG    Component Value Date/Time   PHART 7.302 (L) 01/13/2018 0355   PCO2ART 87.3 (HH) 01/13/2018 0355   PO2ART 80.0 (L) 01/13/2018 0355   HCO3 43.5 (H) 01/13/2018 0355   TCO2 46 (H) 01/13/2018 0355   O2SAT 94.0 01/13/2018 0355     Coagulation Profile: No results for input(s): INR, PROTIME in the last 168 hours.  Cardiac Enzymes: No results for input(s): CKTOTAL, CKMB, CKMBINDEX, TROPONINI in the last 168 hours.  HbA1C: No results found for: HGBA1C  CBG: Recent Labs  Lab 01/29/2018 1827 01/13/18 0013 01/13/18 0421  GLUCAP 112* 118* 128*    Review of Systems:   Denies dyspnea   Past Medical History  She,  has a past medical history of COPD (chronic obstructive pulmonary disease) (HCC), COPD exacerbation (HCC) (05/25/2012), Hypercholesteremia, and Hypertension.   Surgical History    Past Surgical History:  Procedure Laterality Date  . APPENDECTOMY       Social History   reports that she has been smoking cigarettes. She has a 60.00 pack-year smoking history. She has never used smokeless tobacco. She reports current alcohol use. She reports that she does not use drugs.   Family History   Her family history includes Lung cancer in her mother.   Allergies No Known Allergies   Home Medications  Prior to Admission medications   Medication Sig Start Date End Date Taking? Authorizing Provider  atorvastatin (LIPITOR) 80 MG tablet TAKE 1 TABLET (80 MG TOTAL) BY MOUTH DAILY AT 6 PM. Patient taking differently: Take 80 mg by mouth daily.  08/14/17  Yes Riccio, Marylene LandAngela C, DO  budesonide-formoterol (SYMBICORT) 160-4.5 MCG/ACT inhaler Inhale 2 puffs into the lungs 2 (two) times daily. 12/04/17  Yes Riccio,  Angela C, DO  leflunomide (ARAVA) 20 MG tablet Take 20 mg by mouth daily. 02/01/16  Yes [provider]  losartan (COZAAR) 25 MG tablet Take 50 mg by mouth daily. 09/22/17  Yes [provider]  tiotropium (SPIRIVA HANDIHALER) 18 MCG inhalation capsule Place 1 capsule (18 mcg total) into inhaler and inhale daily. 04/25/17  Yes Riccio, Marylene LandAngela C, DO  albuterol (PROVENTIL HFA;VENTOLIN  HFA) 108 (90 Base) MCG/ACT inhaler Inhale 2 puffs into the lungs every 4 (four) hours as needed for wheezing or shortness of breath. Patient not taking: Reported on 01/09/2018 03/06/17   Dolores Patty C, DO  albuterol (PROVENTIL) (2.5 MG/3ML) 0.083% nebulizer solution Take 3 mLs (2.5 mg total) by nebulization every 6 (six) hours as needed for wheezing or shortness of breath. Patient not taking: Reported on 01/10/2018 05/25/17   Casey Burkitt, MD  ferrous sulfate 325 (65 FE) MG tablet TAKE 1 TABLET BY MOUTH EVERY OTHER DAY Patient not taking: Reported on 01/07/2018 12/25/17   Lovena Neighbours, MD  furosemide (LASIX) 20 MG tablet Take 1 tablet (20 mg total) by mouth daily as needed for edema. Patient not taking: Reported on 10/31/2017 01/19/17   Almon Hercules, MD  lactose free nutrition (BOOST PLUS) LIQD Take 237 mLs by mouth 3 (three) times daily with meals. 11/02/17   Myrene Buddy, MD  nicotine (NICODERM CQ - DOSED IN MG/24 HOURS) 14 mg/24hr patch Place 1 patch (14 mg total) onto the skin daily. Patient not taking: Reported on 01/07/2018 11/09/17   Mirian Mo, MD  nicotine (NICODERM CQ - DOSED IN MG/24 HOURS) 21 mg/24hr patch Place 1 patch (21 mg total) onto the skin daily. Patient not taking: Reported on 01/18/2018 11/03/17   Myrene Buddy, MD  nicotine (NICODERM CQ - DOSED IN MG/24 HR) 7 mg/24hr patch Place 1 patch (7 mg total) onto the skin daily. Patient not taking: Reported on 01/09/2018 11/09/17   Mirian Mo, MD  OXYGEN 3lpm with sleep and as needed during the day    [provider]      Critical care time: **

## 2018-01-14 NOTE — Progress Notes (Addendum)
63 year old woman with rheumatoid arthritis and severe COPD admitted 1/10 with respiratory distress and fever, requiring BiPAP for acute on chronic respiratory acidosis FEV1 28% in 2015  She tolerated few hours of BiPAP yesterday, back on it overnight and this morning within an hour of BiPAP developed distress and was placed back on full facemask. Settings are 18/8 and she seems to generate tidal volume of 500 cc with this, awake and alert, interactive, no asterixis, no accessory muscle use while on BiPAP and able to speak in full sentences. Soft and nontender abdomen.  ABG shows acute on chronic respiratory acidosis. Labs show normal creatinine and electrolytes, mild anemia and no leukocytosis.  Impression/plan  Acute on chronic hypercarbic respiratory failure-continue BiPAP 18/8 with breaks every 4 hours for as long as she tolerates, maintain nocturnal BiPAP. Remains high risk for intubation but hopefully we can avoid this here since she is at very high risk for prolonged mechanical ventilation.  COPD exacerbation-although no obvious bronchospasm, will continue IV Solu-Medrol and bronchodilators at current doses Respiratory viral panel and cultures negative and we can consider stopping antibiotics once she is clinically improved  Acute encephalopathy-metabolic, but if she does get agitated can use Haldol  Needs goals of care discussion with patient and family to clarify intubation status since she is at very high risk for prolonged mechanical ventilation and consequences.  My critical care time x 69m   Rakesh V. Vassie Loll MD

## 2018-01-15 ENCOUNTER — Inpatient Hospital Stay (HOSPITAL_COMMUNITY): Payer: Medicaid Other

## 2018-01-15 MED ORDER — FUROSEMIDE 10 MG/ML IJ SOLN
20.0000 mg | Freq: Once | INTRAMUSCULAR | Status: AC
  Administered 2018-01-15: 20 mg via INTRAVENOUS
  Filled 2018-01-15: qty 2

## 2018-01-15 MED ORDER — DM-GUAIFENESIN ER 30-600 MG PO TB12
1.0000 | ORAL_TABLET | Freq: Two times a day (BID) | ORAL | Status: DC
  Administered 2018-01-15: 1 via ORAL
  Filled 2018-01-15 (×3): qty 1

## 2018-01-15 NOTE — Progress Notes (Signed)
NAME:  Judith Scott, MRN:  [id number], DOB:  [birth year], LOS: 3 ADMISSION DATE:  Mar 16, 2018, CONSULTATION DATE:  1/10 REFERRING MD: Denton LankSteinl - EM , CHIEF COMPLAINT:  AMS, SOB  Brief History   63 yo woman with COPD presented 1/10 with AMS and progressive dyspnea. She had associated fever, weakness and reduced PO intake.   She was noted to be in acute on chronic respiratory failure. RVP and sputum cultures were collected.    She was treated with solu medrol and bronchodilators and required noninvasive mechanical ventilation, she was a high risk for intubation so PCCM was consulted for admission.    Past Medical History  COPD, HTN, anemia, Hypercholesteremia, RA   Significant Hospital Events   1/10 > admission   Consults:  PCCM   Procedures:    Significant Diagnostic Tests:  1/10 CXR> hyperinflated lungs, no focal opacity, some interstitial coarseness   Micro Data:  1/10 Blood cultures > NGTD  1/9 sputum > moderate GPC in pairs and chains, rare GPR on gram stain but cultre consistent with normal respiratory flora  1/10 RVP neg MRSA neg  Antimicrobials:  Cefepime 1/10 Vanc 1/10 Azithromycin 1/10>> Rocephin 1/10>> RVP neg   Interim history/subjective:   Off Bipap for two hours yesterday evening   Objective   Blood pressure (!) 175/97, pulse (!) 138, temperature 97.9 F (36.6 C), temperature source Oral, resp. rate (!) 29, weight 46.3 kg, SpO2 93 %.    FiO2 (%):  [40 %-50 %] 40 %   Intake/Output Summary (Last 24 hours) at 01/15/2018 0732 Last data filed at 01/15/2018 0600 Gross per 24 hour  Intake 564.9 ml  Output 100 ml  Net 464.9 ml   Filed Weights   01/13/18 0217 01/14/18 0500 01/15/18 0500  Weight: 48.3 kg 48.8 kg 46.3 kg    Examination: General: not in acute distress  HENT: Bipap in place  Lungs: rattling chest sounds which match with the vibration of her mask, normal work of breathing  Cardiovascular: regular rate and rhythm, no murmur  Abdomen: soft,  non tender, non distended Extremities: no peripheral edema  Neuro: awake and alert, conversational  Resolved Hospital Problem list     Assessment & Plan:   63 yo woman with Hx COPD presenting with acute hypercarbic respiratory failure found to be in a COPD exacerbation.   COPD exacerbation  Acute hypercarbic respiratory failure  Trial of Bipap wean yesterday was not tolerated so she attempted Bipap every 4 hours and nocturnal.  - taper IV solumedrol to 40 mg q6h to po prednisone when off of bipap - continue Brovana, pulmicort, scheduled duoneb - continue CTX and azithromycin   Hypomagnesemia  - resolved   Tobacco use  - cont nicotine patch   RA  - continue to hold home immunosuppresion   HTN  - holding home meds   Best practice:  Diet: Npo Pain/Anxiety/Delirium protocol (if indicated): N/A VAP protocol (if indicated): N/A DVT prophylaxis: subq Heparin  GI prophylaxis: NA Glucose control: NA Mobility: bedrest Code Status: full Family Communication: none at bedside  Disposition:   Labs   CBC: Recent Labs  Lab Feb 07, 2018 1054 01/13/18 0227 01/14/18 0334  WBC 14.8* 10.4 6.2  NEUTROABS 11.7*  --   --   HGB 10.5* 9.2* 8.4*  HCT 37.0 31.9* 29.0*  MCV 99.7 100.0 97.3  PLT 325 313 305    Basic Metabolic Panel: Recent Labs  Lab Feb 07, 2018 1054 Feb 07, 2018 2005 01/13/18 0227 01/14/18 0334  NA 138  --   --  142  K 3.7  --   --  3.9  CL 92*  --   --  97*  CO2 36*  --   --  35*  GLUCOSE 95  --   --  115*  BUN 5*  --   --  21  CREATININE 0.51  --   --  0.57  CALCIUM 9.0  --   --  8.6*  MG  --  1.5*  --  2.1  PHOS  --  4.1 3.0 2.7   GFR: Estimated Creatinine Clearance: 51.9 mL/min (by C-G formula based on SCr of 0.57 mg/dL). Recent Labs  Lab 01/27/2018 1054 01/11/2018 1102 01/20/2018 2005 01/13/18 0227 01/14/18 0334  PROCALCITON  --   --  0.14  --   --   WBC 14.8*  --   --  10.4 6.2  LATICACIDVEN  --  0.57  --   --   --     Liver Function Tests: Recent  Labs  Lab 01/23/2018 1054  AST 20  ALT 15  ALKPHOS 88  BILITOT 0.5  PROT 6.8  ALBUMIN 2.8*   No results for input(s): LIPASE, AMYLASE in the last 168 hours. No results for input(s): AMMONIA in the last 168 hours.  ABG    Component Value Date/Time   PHART 7.302 (L) 01/13/2018 0355   PCO2ART 87.3 (HH) 01/13/2018 0355   PO2ART 80.0 (L) 01/13/2018 0355   HCO3 43.5 (H) 01/13/2018 0355   TCO2 46 (H) 01/13/2018 0355   O2SAT 94.0 01/13/2018 0355     Coagulation Profile: No results for input(s): INR, PROTIME in the last 168 hours.  Cardiac Enzymes: No results for input(s): CKTOTAL, CKMB, CKMBINDEX, TROPONINI in the last 168 hours.  HbA1C: No results found for: HGBA1C  CBG: Recent Labs  Lab 01/24/2018 1827 01/13/18 0013 01/13/18 0421  GLUCAP 112* 118* 128*    Review of Systems:   Denies dyspnea   Past Medical History  She,  has a past medical history of COPD (chronic obstructive pulmonary disease) (HCC), COPD exacerbation (HCC) (05/25/2012), Hypercholesteremia, and Hypertension.   Surgical History    Past Surgical History:  Procedure Laterality Date  . APPENDECTOMY       Social History   reports that she has been smoking cigarettes. She has a 60.00 pack-year smoking history. She has never used smokeless tobacco. She reports current alcohol use. She reports that she does not use drugs.   Family History   Her family history includes Lung cancer in her mother.   Allergies No Known Allergies   Home Medications  Prior to Admission medications   Medication Sig Start Date End Date Taking? Authorizing Provider  atorvastatin (LIPITOR) 80 MG tablet TAKE 1 TABLET (80 MG TOTAL) BY MOUTH DAILY AT 6 PM. Patient taking differently: Take 80 mg by mouth daily.  08/14/17  Yes Riccio, Marylene Land C, DO  budesonide-formoterol (SYMBICORT) 160-4.5 MCG/ACT inhaler Inhale 2 puffs into the lungs 2 (two) times daily. 12/04/17  Yes Riccio, Angela C, DO  leflunomide (ARAVA) 20 MG tablet Take 20  mg by mouth daily. 02/01/16  Yes [provider]  losartan (COZAAR) 25 MG tablet Take 50 mg by mouth daily. 09/22/17  Yes [provider]  tiotropium (SPIRIVA HANDIHALER) 18 MCG inhalation capsule Place 1 capsule (18 mcg total) into inhaler and inhale daily. 04/25/17  Yes Riccio, Angela C, DO  albuterol (PROVENTIL HFA;VENTOLIN HFA) 108 (90 Base) MCG/ACT inhaler Inhale 2 puffs into the lungs every 4 (four) hours  as needed for wheezing or shortness of breath. Patient not taking: Reported on 02/01/2018 03/06/17   Dolores Patty C, DO  albuterol (PROVENTIL) (2.5 MG/3ML) 0.083% nebulizer solution Take 3 mLs (2.5 mg total) by nebulization every 6 (six) hours as needed for wheezing or shortness of breath. Patient not taking: Reported on 02/01/2018 05/25/17   Casey Burkitt, MD  ferrous sulfate 325 (65 FE) MG tablet TAKE 1 TABLET BY MOUTH EVERY OTHER DAY Patient not taking: Reported on 01/09/2018 12/25/17   Lovena Neighbours, MD  furosemide (LASIX) 20 MG tablet Take 1 tablet (20 mg total) by mouth daily as needed for edema. Patient not taking: Reported on 10/31/2017 01/19/17   Almon Hercules, MD  lactose free nutrition (BOOST PLUS) LIQD Take 237 mLs by mouth 3 (three) times daily with meals. 11/02/17   Myrene Buddy, MD  nicotine (NICODERM CQ - DOSED IN MG/24 HOURS) 14 mg/24hr patch Place 1 patch (14 mg total) onto the skin daily. Patient not taking: Reported on 01/04/2018 11/09/17   Mirian Mo, MD  nicotine (NICODERM CQ - DOSED IN MG/24 HOURS) 21 mg/24hr patch Place 1 patch (21 mg total) onto the skin daily. Patient not taking: Reported on 02/02/2018 11/03/17   Myrene Buddy, MD  nicotine (NICODERM CQ - DOSED IN MG/24 HR) 7 mg/24hr patch Place 1 patch (7 mg total) onto the skin daily. Patient not taking: Reported on 01/09/2018 11/09/17   Mirian Mo, MD  OXYGEN 3lpm with sleep and as needed during the day    [provider]     Critical care time: **

## 2018-01-15 NOTE — Progress Notes (Signed)
FPTS Interim Progress Note  Informed by PCP, Dr. Wonda Olds, that patient was hospitalized under the care of the ICU service.  We will be happy to take over care once patient is stable for transfer to medical-surgical floor.  Appreciate great care received in the ICU.  Oralia Manis, DO 01/15/2018, 9:16 AM PGY-2, [facility] pager [phone]

## 2018-01-15 NOTE — Progress Notes (Signed)
RT note: patient taken off of bipap and placed on 10L salter high-flow nasal cannula with sats currently at 97%.  Vitals are stable.  Will continue to monitor and assess for bipap needs.

## 2018-01-16 DIAGNOSIS — J9622 Acute and chronic respiratory failure with hypercapnia: Secondary | ICD-10-CM

## 2018-01-16 DIAGNOSIS — Z515 Encounter for palliative care: Secondary | ICD-10-CM

## 2018-01-16 DIAGNOSIS — Z7189 Other specified counseling: Secondary | ICD-10-CM

## 2018-01-16 LAB — CBC
HCT: 38.3 % (ref 36.0–46.0)
Hemoglobin: 10.6 g/dL — ABNORMAL LOW (ref 12.0–15.0)
MCH: 28 pg (ref 26.0–34.0)
MCHC: 27.7 g/dL — ABNORMAL LOW (ref 30.0–36.0)
MCV: 101.1 fL — ABNORMAL HIGH (ref 80.0–100.0)
Platelets: 423 10*3/uL — ABNORMAL HIGH (ref 150–400)
RBC: 3.79 MIL/uL — ABNORMAL LOW (ref 3.87–5.11)
RDW: 14.3 % (ref 11.5–15.5)
WBC: 9.5 10*3/uL (ref 4.0–10.5)
nRBC: 0 % (ref 0.0–0.2)

## 2018-01-16 LAB — BASIC METABOLIC PANEL
Anion gap: 13 (ref 5–15)
BUN: 32 mg/dL — ABNORMAL HIGH (ref 8–23)
CO2: 42 mmol/L — ABNORMAL HIGH (ref 22–32)
CREATININE: 0.82 mg/dL (ref 0.44–1.00)
Calcium: 9.1 mg/dL (ref 8.9–10.3)
Chloride: 97 mmol/L — ABNORMAL LOW (ref 98–111)
GFR calc Af Amer: >60 mL/min (ref >60)
GFR calc non Af Amer: >60 mL/min (ref >60)
Glucose, Bld: 119 mg/dL — ABNORMAL HIGH (ref 70–99)
Potassium: 4 mmol/L (ref 3.5–5.1)
Sodium: 152 mmol/L — ABNORMAL HIGH (ref 135–145)

## 2018-01-16 LAB — MAGNESIUM: MAGNESIUM: 2 mg/dL (ref 1.7–2.4)

## 2018-01-16 MED ORDER — WHITE PETROLATUM EX OINT
TOPICAL_OINTMENT | CUTANEOUS | Status: AC
  Administered 2018-01-17: 0.2
  Filled 2018-01-16: qty 28.35

## 2018-01-16 MED ORDER — ACETAMINOPHEN 650 MG RE SUPP: 650.0000 mg | RECTAL | Status: DC | PRN

## 2018-01-16 MED ORDER — HYDRALAZINE HCL 20 MG/ML IJ SOLN: 5.0000 mg | Freq: Three times a day (TID) | INTRAMUSCULAR | Status: DC | PRN

## 2018-01-16 MED ORDER — GUAIFENESIN-DM 100-10 MG/5ML PO SYRP
5.0000 mL | ORAL_SOLUTION | ORAL | Status: DC | PRN
  Administered 2018-01-16: 5 mL via ORAL
  Filled 2018-01-16 (×2): qty 5

## 2018-01-16 MED ORDER — MORPHINE SULFATE (PF) 2 MG/ML IV SOLN
1.0000 mg | INTRAVENOUS | Status: DC | PRN
  Administered 2018-01-16 – 2018-01-17 (×3): 1 mg via INTRAVENOUS
  Filled 2018-01-16 (×4): qty 1

## 2018-01-16 NOTE — Progress Notes (Addendum)
NAME:  Judith Scott, MRN:  [id number], DOB:  1955/06/27, LOS: 4 ADMISSION DATE:  01/22/2018, CONSULTATION DATE:  1/10 REFERRING MD: Denton Lank - EM , CHIEF COMPLAINT:  AMS, SOB  Brief History   63 yo woman with COPD presented 1/10 with AMS and progressive dyspnea. She had associated fever, weakness and reduced PO intake.   She was noted to be in acute on chronic respiratory failure. RVP and sputum cultures were collected.    She was treated with solu medrol and bronchodilators and required noninvasive mechanical ventilation, she was a high risk for intubation so PCCM was consulted for admission.    Past Medical History  COPD, HTN, anemia, Hypercholesteremia, RA   Significant Hospital Events   1/10 > admission   Consults:  PCCM   Procedures:    Significant Diagnostic Tests:  1/10 CXR> hyperinflated lungs, no focal opacity, some interstitial coarseness   Micro Data:  1/10 Blood cultures > NGTD  1/9 sputum > moderate GPC in pairs and chains, rare GPR on gram stain but cultre consistent with normal respiratory flora  1/10 RVP neg MRSA neg  Antimicrobials:  Cefepime 1/10 Vanc 1/10 Azithromycin 1/10>> Rocephin 1/10>> RVP neg  MRSA -   Interim history/subjective:   Transitioned from Bipap to high flow nasal canula yesterday evening and maintained sats 97%, however she became progressively more obtunded and had to go back on Bipap    Objective   Blood pressure (!) 146/77, pulse (!) 131, temperature 97.6 F (36.4 C), temperature source Axillary, resp. rate (!) 26, weight 46.1 kg, SpO2 (!) 88 %.    FiO2 (%):  [35 %-50 %] 50 %   Intake/Output Summary (Last 24 hours) at 01/16/2018 0640 Last data filed at 01/16/2018 0600 Gross per 24 hour  Intake 573.84 ml  Output 700 ml  Net -126.16 ml   Filed Weights   01/14/18 0500 01/15/18 0500 01/16/18 0412  Weight: 48.8 kg 46.3 kg 46.1 kg    Examination: General: appears fatigued, chronically ill  HENT: Bipap in place  Lungs:  rattling chest sounds which match with the vibration of her mask, intercostal retractions  Cardiovascular: regular rate and rhythm, no murmur  Abdomen: soft, non tender, non distended Extremities: no peripheral edema  Neuro: opens eyes to voice, short answers to questions   Resolved Hospital Problem list     Assessment & Plan:   63 yo woman with Hx COPD presenting with acute hypercarbic respiratory failure found to be in a COPD exacerbation.   COPD exacerbation  Acute hypercarbic respiratory failure  Trial of Bipap wean yesterday was better tolerated. Yesterday she was given a dose of IV lasix, there were multiple unmeasured occurrences but it does not seem that this helped with her Bipap needs.  - IV solumedrol to 40 mg q6h - continue Brovana, pulmicort, scheduled duoneb - continue CTX and azithromycin  - redose IV lasix, will follow output closely   Hypomagnesemia  - resolved   Tobacco use  - cont nicotine patch   RA  - continue to hold home immunosuppresion   HTN  - blood pressure is okay, holding home meds   Best practice:  Diet: Npo for Bipap  Pain/Anxiety/Delirium protocol (if indicated): N/A VAP protocol (if indicated): N/A DVT prophylaxis: subq Heparin  GI prophylaxis: NA Glucose control: NA Mobility: bedrest Code Status: full Family Communication: none at bedside  Disposition:   Labs   CBC: Recent Labs  Lab 01/04/2018 1054 01/13/18 0227 01/14/18 0334  WBC 14.8* 10.4  6.2  NEUTROABS 11.7*  --   --   HGB 10.5* 9.2* 8.4*  HCT 37.0 31.9* 29.0*  MCV 99.7 100.0 97.3  PLT 325 313 305    Basic Metabolic Panel: Recent Labs  Lab 01/16/2018 1054 01/05/2018 2005 01/13/18 0227 01/14/18 0334  NA 138  --   --  142  K 3.7  --   --  3.9  CL 92*  --   --  97*  CO2 36*  --   --  35*  GLUCOSE 95  --   --  115*  BUN 5*  --   --  21  CREATININE 0.51  --   --  0.57  CALCIUM 9.0  --   --  8.6*  MG  --  1.5*  --  2.1  PHOS  --  4.1 3.0 2.7   GFR: Estimated  Creatinine Clearance: 51.7 mL/min (by C-G formula based on SCr of 0.57 mg/dL). Recent Labs  Lab 01/20/2018 1054 01/21/2018 1102 01/05/2018 2005 01/13/18 0227 01/14/18 0334  PROCALCITON  --   --  0.14  --   --   WBC 14.8*  --   --  10.4 6.2  LATICACIDVEN  --  0.57  --   --   --     Liver Function Tests: Recent Labs  Lab 01/28/2018 1054  AST 20  ALT 15  ALKPHOS 88  BILITOT 0.5  PROT 6.8  ALBUMIN 2.8*   No results for input(s): LIPASE, AMYLASE in the last 168 hours. No results for input(s): AMMONIA in the last 168 hours.  ABG    Component Value Date/Time   PHART 7.302 (L) 01/13/2018 0355   PCO2ART 87.3 (HH) 01/13/2018 0355   PO2ART 80.0 (L) 01/13/2018 0355   HCO3 43.5 (H) 01/13/2018 0355   TCO2 46 (H) 01/13/2018 0355   O2SAT 94.0 01/13/2018 0355     Coagulation Profile: No results for input(s): INR, PROTIME in the last 168 hours.  Cardiac Enzymes: No results for input(s): CKTOTAL, CKMB, CKMBINDEX, TROPONINI in the last 168 hours.  HbA1C: No results found for: HGBA1C  CBG: Recent Labs  Lab 01/23/2018 1827 01/13/18 0013 01/13/18 0421  GLUCAP 112* 118* 128*    Review of Systems:   Denies dyspnea   Past Medical History  She,  has a past medical history of COPD (chronic obstructive pulmonary disease) (HCC), COPD exacerbation (HCC) (05/25/2012), Hypercholesteremia, and Hypertension.   Surgical History    Past Surgical History:  Procedure Laterality Date  . APPENDECTOMY       Social History   reports that she has been smoking cigarettes. She has a 60.00 pack-year smoking history. She has never used smokeless tobacco. She reports current alcohol use. She reports that she does not use drugs.   Family History   Her family history includes Lung cancer in her mother.   Allergies No Known Allergies   Home Medications  Prior to Admission medications   Medication Sig Start Date End Date Taking? Authorizing Provider  atorvastatin (LIPITOR) 80 MG tablet TAKE 1 TABLET  (80 MG TOTAL) BY MOUTH DAILY AT 6 PM. Patient taking differently: Take 80 mg by mouth daily.  08/14/17  Yes Riccio, Marylene LandAngela C, DO  budesonide-formoterol (SYMBICORT) 160-4.5 MCG/ACT inhaler Inhale 2 puffs into the lungs 2 (two) times daily. 12/04/17  Yes Riccio, Angela C, DO  leflunomide (ARAVA) 20 MG tablet Take 20 mg by mouth daily. 02/01/16  Yes [provider]  losartan (COZAAR) 25 MG tablet Take  50 mg by mouth daily. 09/22/17  Yes [provider]  tiotropium (SPIRIVA HANDIHALER) 18 MCG inhalation capsule Place 1 capsule (18 mcg total) into inhaler and inhale daily. 04/25/17  Yes Riccio, Angela C, DO  albuterol (PROVENTIL HFA;VENTOLIN HFA) 108 (90 Base) MCG/ACT inhaler Inhale 2 puffs into the lungs every 4 (four) hours as needed for wheezing or shortness of breath. Patient not taking: Reported on Jan 17, 2018 03/06/17   Dolores Patty C, DO  albuterol (PROVENTIL) (2.5 MG/3ML) 0.083% nebulizer solution Take 3 mLs (2.5 mg total) by nebulization every 6 (six) hours as needed for wheezing or shortness of breath. Patient not taking: Reported on 02/01/2018 05/25/17   Casey Burkitt, MD  ferrous sulfate 325 (65 FE) MG tablet TAKE 1 TABLET BY MOUTH EVERY OTHER DAY Patient not taking: Reported on Jan 17, 2018 12/25/17   Lovena Neighbours, MD  furosemide (LASIX) 20 MG tablet Take 1 tablet (20 mg total) by mouth daily as needed for edema. Patient not taking: Reported on 10/31/2017 01/19/17   Almon Hercules, MD  lactose free nutrition (BOOST PLUS) LIQD Take 237 mLs by mouth 3 (three) times daily with meals. 11/02/17   Myrene Buddy, MD  nicotine (NICODERM CQ - DOSED IN MG/24 HOURS) 14 mg/24hr patch Place 1 patch (14 mg total) onto the skin daily. Patient not taking: Reported on 01/11/2018 11/09/17   Mirian Mo, MD  nicotine (NICODERM CQ - DOSED IN MG/24 HOURS) 21 mg/24hr patch Place 1 patch (21 mg total) onto the skin daily. Patient not taking: Reported on Jan 17, 2018 11/03/17   Myrene Buddy, MD   nicotine (NICODERM CQ - DOSED IN MG/24 HR) 7 mg/24hr patch Place 1 patch (7 mg total) onto the skin daily. Patient not taking: Reported on 01/20/2018 11/09/17   Mirian Mo, MD  OXYGEN 3lpm with sleep and as needed during the day    [provider]     Critical care time: **

## 2018-01-16 NOTE — Progress Notes (Signed)
RT note: patient taken off of bipap and placed on 15L salter high-flow nasal cannula.  Patient's vitals currently stable.  Will continue to monitor.

## 2018-01-16 NOTE — Progress Notes (Signed)
Husband updated via phone that Judith Scott is restless, additional medications can be ordered and given, Husband approves and states he is coming after work and will set a time to transition to comfort care tomorrow.

## 2018-01-16 NOTE — Consult Note (Addendum)
Consultation Note Date: 01/16/2018   Patient Name: Judith Scott  DOB: September 08, 1955  MRN: [id number]  Age / Sex: 63 y.o., female  PCP: Tillman Sers, DO Referring Physician: Coralyn Helling, MD  Reason for Consultation: Establishing goals of care  HPI/Patient Profile: 63 y/o female with advanced COPD who has followed with Dr. Sherene Sires in the past presented to the [facility] in respiratory distress.  Clinical Assessment and Goals of Care: Patient is sitting straight upright in bed. Abdominal belt restraint in place. She is staring straight forward with mouth open. She does not answer questions or look at me. O2 sat is 100% on high flow cannula. Per nursing she was oriented x3 2 hours ago when transitioning from BIPAP to high flow. Patient moved back to BIPAP.  Husband Mr. Buchberger and daughter Ms. Sands are at bedside.    Mrs. Runser lives at home with her husband. She is a woman of faith as is the rest of the family. Her daughter states she and her daughter lives with her. She tells me functionally, Mrs. Deshpande stays in the kitchen watching t.v. She smokes 1 PPD. She wears O2 at 4-5 lpm except when she is smoking. Mrs. Pendergast does not leave the house, even for doctor's appointments. The family states she is supposed to be followed by her doctors, but she does not go. They state she has a poor quality of life. Attending Dr. Craige Cotta in to join meeting.   We discussed her diagnosis, prognosis, GOC, and options.  A detailed discussion was had today regarding advanced directives.  The difference between an aggressive medical intervention path and a comfort care path was discussed.  Values and goals of care important to patient and family were attempted to be elicited.  Initially, the family states she would want to do what is needed to live. Upon discussing quality of life and what "living" means to Mrs. Hyacinth Meeker, they feel she  would not want to live as she is. We discussed her difficulty with weaning from the BIPAP and her mental status, and the chance of remaining ventilator dependent if intubated. We discussed their currently stated lack of follow up with outpatient providers.  We discussed disease trajectory.   Discussed limitations of medical interventions to prolong quality of life for this patient at this time in this situation and discussed the concept of human mortality.   Ms. Carlynn Spry states the reason she and her daughter moved in with her mother is because she knew this time was coming. They state they do not want her to suffer.   The family wants time to get other friends/family to visit with her. Plans for continued care including BIPAP with no escalation until tomorrow. They do not want intubation. They do not want chest compressions, shocks, or a breathing tube for cardio/pulmonary arrest, if she declines, they would like for her to be comfortable.    I completed a MOST form today and the signed original was placed in the chart. A photocopy was placed in  the chart to be scanned into EMR. The patient outlined their wishes for the following treatment decisions:  Cardiopulmonary Resuscitation: Do Not Attempt Resuscitation (DNR/No CPR)  Medical Interventions: Limited Additional Interventions: Use medical treatment, IV fluids and cardiac monitoring as indicated, DO NOT USE intubation or mechanical ventilation. May consider use of less invasive airway support such as BiPAP or CPAP. Also provide comfort measures. Transfer to the hospital if indicated. Avoid intensive care.   Antibiotics: Left Blank  IV Fluids: Left Blank  Feeding Tube: No feeding tube   Addendum: Patient is resting in bed on BIPAP. No family at bedside. She is a bit dyspneic. Morphine 1mg  q4 hrs PRN ordered for pain or dyspnea. RN has called family to update them on her status and determine when they will come tomorrow for comfort care. Husband is at  work and will come tonight to spend time with her and determine a time tomorrow.     SUMMARY OF RECOMMENDATIONS   Plans for continuing current level of care with no escalation.  Plans for comfort care tomorrow after family has been able to visit.   Code Status/Advance Care Planning:  DNR   Prognosis:  Hours - Days  Upon stopping BIPAP therapy.   Discharge Planning: To Be Determined      Primary Diagnoses: Present on Admission: . Acute respiratory failure with hypoxia and hypercapnia (HCC)   I have reviewed the medical record, interviewed the patient and family, and examined the patient. The following aspects are pertinent.  Past Medical History:  Diagnosis Date  . COPD (chronic obstructive pulmonary disease) (HCC)   . COPD exacerbation (HCC) 05/25/2012  . Hypercholesteremia   . Hypertension    Social History   Socioeconomic History  . Marital status: Married    Spouse name: Not on file  . Number of children: Not on file  . Years of education: Not on file  . Highest education level: Not on file  Occupational History  . Not on file  Social Needs  . Financial resource strain: Not on file  . Food insecurity:    Worry: Not on file    Inability: Not on file  . Transportation needs:    Medical: Not on file    Non-medical: Not on file  Tobacco Use  . Smoking status: Current Every Day Smoker    Packs/day: 1.50    Years: 40.00    Pack years: 60.00    Types: Cigarettes  . Smokeless tobacco: Never Used  Substance and Sexual Activity  . Alcohol use: Yes    Alcohol/week: 0.0 standard drinks    Comment: occasional  . Drug use: No  . Sexual activity: Yes    Birth control/protection: Post-menopausal  Lifestyle  . Physical activity:    Days per week: Not on file    Minutes per session: Not on file  . Stress: Not on file  Relationships  . Social connections:    Talks on phone: Not on file    Gets together: Not on file    Attends religious service: Not on file     Active member of club or organization: Not on file    Attends meetings of clubs or organizations: Not on file    Relationship status: Not on file  Other Topics Concern  . Not on file  Social History Narrative   epworth sleepiness scale score: 2   Family History  Problem Relation Age of Onset  . 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 budesonide (PULMICORT) nebulizer solution  0.5 mg Nebulization BID  . heparin  5,000 Units Subcutaneous Q8H  . ipratropium-albuterol  3 mL Nebulization Q4H  . methylPREDNISolone (SOLU-MEDROL) injection  40 mg Intravenous Q6H  . nicotine  14 mg Transdermal Daily   Continuous Infusions: . sodium chloride Stopped (01/15/18 1832)  . azithromycin Stopped (01/15/18 1933)  . cefTRIAXone (ROCEPHIN)  IV Stopped (01/16/18 0036)   PRN Meds:.guaiFENesin-dextromethorphan, haloperidol lactate Medications Prior to Admission:  Prior to Admission medications   Medication Sig Start Date End Date Taking? Authorizing Provider  atorvastatin (LIPITOR) 80 MG tablet TAKE 1 TABLET (80 MG TOTAL) BY MOUTH DAILY AT 6 PM. Patient taking differently: Take 80 mg by mouth daily.  08/14/17  Yes Riccio, Marylene LandAngela C, DO  budesonide-formoterol (SYMBICORT) 160-4.5 MCG/ACT inhaler Inhale 2 puffs into the lungs 2 (two) times daily. 12/04/17  Yes Riccio, Angela C, DO  leflunomide (ARAVA) 20 MG tablet Take 20 mg by mouth daily. 02/01/16  Yes [provider]  losartan (COZAAR) 25 MG tablet Take 50 mg by mouth daily. 09/22/17  Yes [provider]  tiotropium (SPIRIVA HANDIHALER) 18 MCG inhalation capsule Place 1 capsule (18 mcg total) into inhaler and inhale daily. 04/25/17  Yes Riccio, Marylene LandAngela C, DO  lactose free nutrition (BOOST PLUS) LIQD Take 237 mLs by mouth 3 (three) times daily with meals. 11/02/17   Myrene BuddyFletcher, Jacob, MD  OXYGEN 3lpm with sleep and as needed during the day    [provider]   No Known Allergies Review of  Systems  Unable to perform ROS   Physical Exam Constitutional:      Appearance: She is ill-appearing.     Vital Signs: BP (!) 160/86   Pulse 99   Temp (!) 97.2 F (36.2 C) (Axillary)   Resp 18   Wt 46.1 kg   SpO2 96%   BMI 17.45 kg/m  Pain Scale: CPOT   Pain Score: 0-No pain   SpO2: SpO2: 96 % O2 Device:SpO2: 96 % O2 Flow Rate: .O2 Flow Rate (L/min): 15 L/min  IO: Intake/output summary:   Intake/Output Summary (Last 24 hours) at 01/16/2018 1227 Last data filed at 01/16/2018 0600 Gross per 24 hour  Intake 515.49 ml  Output -  Net 515.49 ml    LBM: Last BM Date: 01/14/18 Baseline Weight: Weight: 51.3 kg Most recent weight: Weight: 46.1 kg     Palliative Assessment/Data: 10%-20%   Flowsheet Rows     Most Recent Value  Intake Tab  Referral Department  Critical care  Unit at Time of Referral  ICU  Palliative Care Primary Diagnosis  Pulmonary  Date Notified  01/15/18  Palliative Care Type  New Palliative care  Reason for referral  Clarify Goals of Care  Date of Admission  01/10/2018  # of days IP prior to Palliative referral  3  Clinical Assessment  Psychosocial & Spiritual Assessment  Palliative Care Outcomes      Time In: 10:00-10:15                10:45-11:40   Time Total: 65 min Greater than 50%  of this time was spent counseling and coordinating care related to the above assessment and plan.  RN updated on plan. Dr. Craige CottaSood present for care planning.   Signed by: Morton Stallrystal Alayia Meggison, NP   Please contact Palliative Medicine Team phone at [phone] for questions and concerns.  For individual provider: See Loretha StaplerAmion

## 2018-01-16 NOTE — Progress Notes (Signed)
Patient placed back on bipap due to increased work of breathing.  Patient currently tolerating well.  Will continue to monitor.

## 2018-01-16 NOTE — Consult Note (Addendum)
Consultation Note Date: 01/16/2018   Patient Name: Judith Scott  DOB: September 08, 1955  MRN: [id number]  Age / Sex: 63 y.o., female  PCP: Tillman Sers, DO Referring Physician: Coralyn Helling, MD  Reason for Consultation: Establishing goals of care  HPI/Patient Profile: 63 y/o female with advanced COPD who has followed with Dr. Sherene Sires in the past presented to the [facility] ER in respiratory distress.  Clinical Assessment and Goals of Care: Patient is sitting straight upright in bed. Abdominal belt restraint in place. She is staring straight forward with mouth open. She does not answer questions or look at me. O2 sat is 100% on high flow cannula. Per nursing she was oriented x3 2 hours ago when transitioning from BIPAP to high flow. Patient moved back to BIPAP.  Husband Mr. Buchberger and daughter Ms. Sands are at bedside.    Judith Scott lives at home with her husband. She is a woman of faith as is the rest of the family. Her daughter states she and her daughter lives with her. She tells me functionally, Judith Scott stays in the kitchen watching t.v. She smokes 1 PPD. She wears O2 at 4-5 lpm except when she is smoking. Judith Scott does not leave the house, even for doctor's appointments. The family states she is supposed to be followed by her doctors, but she does not go. They state she has a poor quality of life. Attending Dr. Craige Cotta in to join meeting.   We discussed her diagnosis, prognosis, GOC, and options.  A detailed discussion was had today regarding advanced directives.  The difference between an aggressive medical intervention path and a comfort care path was discussed.  Values and goals of care important to patient and family were attempted to be elicited.  Initially, the family states she would want to do what is needed to live. Upon discussing quality of life and what "living" means to Judith Scott, they feel she  would not want to live as she is. We discussed her difficulty with weaning from the BIPAP and her mental status, and the chance of remaining ventilator dependent if intubated. We discussed their currently stated lack of follow up with outpatient providers.  We discussed disease trajectory.   Discussed limitations of medical interventions to prolong quality of life for this patient at this time in this situation and discussed the concept of human mortality.   Ms. Carlynn Scott states the reason she and her daughter moved in with her mother is because she knew this time was coming. They state they do not want her to suffer.   The family wants time to get other friends/family to visit with her. Plans for continued care including BIPAP with no escalation until tomorrow. They do not want intubation. They do not want chest compressions, shocks, or a breathing tube for cardio/pulmonary arrest, if she declines, they would like for her to be comfortable.    I completed a MOST form today and the signed original was placed in the chart. A photocopy was placed in  the chart to be scanned into EMR. The patient outlined their wishes for the following treatment decisions:  Cardiopulmonary Resuscitation: Do Not Attempt Resuscitation (DNR/No CPR)  Medical Interventions: Limited Additional Interventions: Use medical treatment, IV fluids and cardiac monitoring as indicated, DO NOT USE intubation or mechanical ventilation. May consider use of less invasive airway support such as BiPAP or CPAP. Also provide comfort measures. Transfer to the hospital if indicated. Avoid intensive care.   Antibiotics: Left Blank  IV Fluids: Left Blank  Feeding Tube: No feeding tube       SUMMARY OF RECOMMENDATIONS   Plans for continuing current level of care with no escalation.  Plans for comfort care tomorrow after family has been able to visit.   Code Status/Advance Care Planning:  DNR   Prognosis:  Hours - Days  Upon stopping BIPAP  therapy.   Discharge Planning: To Be Determined      Primary Diagnoses: Present on Admission: . Acute respiratory failure with hypoxia and hypercapnia (HCC)   I have reviewed the medical record, interviewed the patient and family, and examined the patient. The following aspects are pertinent.  Past Medical History:  Diagnosis Date  . COPD (chronic obstructive pulmonary disease) (HCC)   . COPD exacerbation (HCC) 05/25/2012  . Hypercholesteremia   . Hypertension    Social History   Socioeconomic History  . Marital status: Married    Spouse name: Not on file  . Number of children: Not on file  . Years of education: Not on file  . Highest education level: Not on file  Occupational History  . Not on file  Social Needs  . Financial resource strain: Not on file  . Food insecurity:    Worry: Not on file    Inability: Not on file  . Transportation needs:    Medical: Not on file    Non-medical: Not on file  Tobacco Use  . Smoking status: Current Every Day Smoker    Packs/day: 1.50    Years: 40.00    Pack years: 60.00    Types: Cigarettes  . Smokeless tobacco: Never Used  Substance and Sexual Activity  . Alcohol use: Yes    Alcohol/week: 0.0 standard drinks    Comment: occasional  . Drug use: No  . Sexual activity: Yes    Birth control/protection: Post-menopausal  Lifestyle  . Physical activity:    Days per week: Not on file    Minutes per session: Not on file  . Stress: Not on file  Relationships  . Social connections:    Talks on phone: Not on file    Gets together: Not on file    Attends religious service: Not on file    Active member of club or organization: Not on file    Attends meetings of clubs or organizations: Not on file    Relationship status: Not on file  Other Topics Concern  . Not on file  Social History Narrative   epworth sleepiness scale score: 2   Family History  Problem Relation Age of Onset  . 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 budesonide (PULMICORT) nebulizer solution  0.5 mg Nebulization BID  . heparin  5,000 Units Subcutaneous Q8H  . ipratropium-albuterol  3 mL Nebulization Q4H  . methylPREDNISolone (SOLU-MEDROL) injection  40 mg Intravenous Q6H  . nicotine  14 mg Transdermal Daily   Continuous Infusions: . sodium chloride Stopped (01/15/18  1832)  . azithromycin Stopped (01/15/18 1933)  . cefTRIAXone (ROCEPHIN)  IV Stopped (01/16/18 0036)   PRN Meds:.guaiFENesin-dextromethorphan, haloperidol lactate Medications Prior to Admission:  Prior to Admission medications   Medication Sig Start Date End Date Taking? Authorizing Provider  atorvastatin (LIPITOR) 80 MG tablet TAKE 1 TABLET (80 MG TOTAL) BY MOUTH DAILY AT 6 PM. Patient taking differently: Take 80 mg by mouth daily.  08/14/17  Yes Riccio, Marylene LandAngela C, DO  budesonide-formoterol (SYMBICORT) 160-4.5 MCG/ACT inhaler Inhale 2 puffs into the lungs 2 (two) times daily. 12/04/17  Yes Riccio, Angela C, DO  leflunomide (ARAVA) 20 MG tablet Take 20 mg by mouth daily. 02/01/16  Yes [provider]  losartan (COZAAR) 25 MG tablet Take 50 mg by mouth daily. 09/22/17  Yes [provider]  tiotropium (SPIRIVA HANDIHALER) 18 MCG inhalation capsule Place 1 capsule (18 mcg total) into inhaler and inhale daily. 04/25/17  Yes Riccio, Marylene LandAngela C, DO  lactose free nutrition (BOOST PLUS) LIQD Take 237 mLs by mouth 3 (three) times daily with meals. 11/02/17   Myrene BuddyFletcher, Jacob, MD  OXYGEN 3lpm with sleep and as needed during the day    [provider]   No Known Allergies Review of Systems  Unable to perform ROS   Physical Exam Constitutional:      Appearance: She is ill-appearing.     Vital Signs: BP (!) 147/93 (BP Location: Right Leg)   Pulse (!) 115   Temp (!) 96.9 F (36.1 C) (Axillary)   Resp (!) 24   Wt 46.1 kg   SpO2 100%   BMI 17.45 kg/m  Pain Scale: CPOT   Pain Score: 0-No  pain   SpO2: SpO2: 100 % O2 Device:SpO2: 100 % O2 Flow Rate: .O2 Flow Rate (L/min): 15 L/min  IO: Intake/output summary:   Intake/Output Summary (Last 24 hours) at 01/16/2018 1022 Last data filed at 01/16/2018 0600 Gross per 24 hour  Intake 533.83 ml  Output 600 ml  Net -66.17 ml    LBM: Last BM Date: 01/13/18 Baseline Weight: Weight: 51.3 kg Most recent weight: Weight: 46.1 kg     Palliative Assessment/Data: 10%-20%   Flowsheet Rows     Most Recent Value  Intake Tab  Referral Department  Critical care  Unit at Time of Referral  ICU  Palliative Care Primary Diagnosis  Pulmonary  Date Notified  01/15/18  Palliative Care Type  New Palliative care  Reason for referral  Clarify Goals of Care  Date of Admission  01/09/2018  # of days IP prior to Palliative referral  3  Clinical Assessment  Psychosocial & Spiritual Assessment  Palliative Care Outcomes      Time In: 10:00-10:15                10:45-11:40   Time Total: 65 min Greater than 50%  of this time was spent counseling and coordinating care related to the above assessment and plan.  Signed by: Morton Stallrystal Josie Mesa, NP   Please contact Palliative Medicine Team phone at [phone] for questions and concerns.  For individual provider: See Loretha StaplerAmion

## 2018-01-17 LAB — CULTURE, BLOOD (ROUTINE X 2)
Culture: NO GROWTH
Culture: NO GROWTH
Special Requests: ADEQUATE
Special Requests: ADEQUATE

## 2018-01-17 LAB — MAGNESIUM: Magnesium: 2.1 mg/dL (ref 1.7–2.4)

## 2018-01-17 LAB — BASIC METABOLIC PANEL
ANION GAP: 14 (ref 5–15)
BUN: 32 mg/dL — ABNORMAL HIGH (ref 8–23)
CO2: 43 mmol/L — ABNORMAL HIGH (ref 22–32)
Calcium: 9.1 mg/dL (ref 8.9–10.3)
Chloride: 102 mmol/L (ref 98–111)
Creatinine, Ser: 0.52 mg/dL (ref 0.44–1.00)
GFR calc Af Amer: >60 mL/min (ref >60)
GFR calc non Af Amer: >60 mL/min (ref >60)
Glucose, Bld: 120 mg/dL — ABNORMAL HIGH (ref 70–99)
Potassium: 4.6 mmol/L (ref 3.5–5.1)
Sodium: 159 mmol/L — ABNORMAL HIGH (ref 135–145)

## 2018-01-17 MED ORDER — HALOPERIDOL LACTATE 5 MG/ML IJ SOLN: 0.5000 mg | INTRAMUSCULAR | Status: DC | PRN

## 2018-01-17 MED ORDER — POLYVINYL ALCOHOL 1.4 % OP SOLN
1.0000 [drp] | Freq: Four times a day (QID) | OPHTHALMIC | Status: DC | PRN
  Filled 2018-01-17: qty 15

## 2018-01-17 MED ORDER — MORPHINE 100MG IN NS 100ML (1MG/ML) PREMIX INFUSION
1.0000 mg/h | INTRAVENOUS | Status: DC
  Filled 2018-01-17: qty 100

## 2018-01-17 MED ORDER — ACETAMINOPHEN 650 MG RE SUPP: 650.0000 mg | Freq: Four times a day (QID) | RECTAL | Status: DC | PRN

## 2018-01-17 MED ORDER — GLYCOPYRROLATE 0.2 MG/ML IJ SOLN: 0.2000 mg | INTRAMUSCULAR | Status: DC | PRN

## 2018-01-17 MED ORDER — LORAZEPAM 2 MG/ML IJ SOLN: 1.0000 mg | INTRAMUSCULAR | Status: DC | PRN

## 2018-01-17 MED ORDER — MORPHINE SULFATE (PF) 2 MG/ML IV SOLN: 1.0000 mg | INTRAVENOUS | Status: DC | PRN

## 2018-01-17 MED ORDER — MORPHINE SULFATE (PF) 2 MG/ML IV SOLN
1.0000 mg | INTRAVENOUS | Status: AC | PRN
  Administered 2018-01-17: 1 mg via INTRAVENOUS

## 2018-01-17 MED ORDER — IPRATROPIUM-ALBUTEROL 0.5-2.5 (3) MG/3ML IN SOLN: 3.0000 mL | Freq: Four times a day (QID) | RESPIRATORY_TRACT | Status: DC | PRN

## 2018-01-17 MED ORDER — LORAZEPAM 2 MG/ML PO CONC: 1.0000 mg | ORAL | Status: DC | PRN

## 2018-01-17 MED ORDER — GLYCOPYRROLATE 1 MG PO TABS: 1.0000 mg | ORAL_TABLET | ORAL | Status: DC | PRN

## 2018-01-17 MED ORDER — ONDANSETRON HCL 4 MG/2ML IJ SOLN: 4.0000 mg | Freq: Four times a day (QID) | INTRAMUSCULAR | Status: DC | PRN

## 2018-01-17 MED ORDER — ACETAMINOPHEN 325 MG PO TABS: 650.0000 mg | ORAL_TABLET | Freq: Four times a day (QID) | ORAL | Status: DC | PRN

## 2018-01-17 MED ORDER — HALOPERIDOL 0.5 MG PO TABS: 0.5000 mg | ORAL_TABLET | ORAL | Status: DC | PRN

## 2018-01-17 MED ORDER — ONDANSETRON 4 MG PO TBDP
4.0000 mg | ORAL_TABLET | Freq: Four times a day (QID) | ORAL | Status: DC | PRN
  Filled 2018-01-17: qty 1

## 2018-01-17 MED ORDER — MORPHINE BOLUS VIA INFUSION
1.0000 mg | INTRAVENOUS | Status: DC | PRN
  Filled 2018-01-17: qty 3

## 2018-01-17 MED ORDER — LORAZEPAM 1 MG PO TABS: 1.0000 mg | ORAL_TABLET | ORAL | Status: DC | PRN

## 2018-01-17 MED ORDER — MORPHINE SULFATE (CONCENTRATE) 10 MG/0.5ML PO SOLN: 5.0000 mg | ORAL | Status: DC | PRN

## 2018-01-17 MED ORDER — HALOPERIDOL LACTATE 2 MG/ML PO CONC
0.5000 mg | ORAL | Status: DC | PRN
  Filled 2018-01-17: qty 0.3

## 2018-01-17 MED ORDER — BIOTENE DRY MOUTH MT LIQD: 15.0000 mL | OROMUCOSAL | Status: DC | PRN

## 2018-01-19 ENCOUNTER — Telehealth: Payer: Self-pay | Admitting: Pulmonary Disease

## 2018-01-19 NOTE — Telephone Encounter (Incomplete)
01/19/2018 Received D/C from [employer]. I will have our courier deliever to [facility] at the pulmonary office on [address].  PWR

## 2018-01-25 ENCOUNTER — Telehealth: Payer: Self-pay

## 2018-01-25 NOTE — Telephone Encounter (Signed)
Received dc back from Doctor Vassie Loll. I got dc ready and called funeral [facility] to let them know dc is ready for pickup.  [phone] mc

## 2018-02-03 NOTE — Progress Notes (Signed)
Daily Progress Note   Patient Name: Judith Scott       Date: Jan 25, 2018 DOB: [birth year]  Age: 63 y.o. MRN#: [id number] Attending Physician: Coralyn Helling, MD Primary Care Physician: Tillman Sers, DO Admit Date: 01/23/2018  Reason for Consultation/Follow-up: Establishing goals of care  Subjective: Patient is resting in bed with eyes closed, she does not respond to voice or touch. She appears comfortable on high flow O2. Some of her family memebers are at bedside. They are waiting for 1 other family member to come visit before transitioning to full comfort care. They state the family member will be her at or after 5:00. Spoke with CCM staff to update. Family minister in with family.    Length of Stay: 5  Current Medications: Scheduled Meds:  . budesonide (PULMICORT) nebulizer solution  0.5 mg Nebulization BID  . heparin  5,000 Units Subcutaneous Q8H  . ipratropium-albuterol  3 mL Nebulization Q4H  . methylPREDNISolone (SOLU-MEDROL) injection  40 mg Intravenous Q6H  . nicotine  14 mg Transdermal Daily    Continuous Infusions: . sodium chloride Stopped (01/16/18 1719)    PRN Meds: acetaminophen, guaiFENesin-dextromethorphan, haloperidol lactate, hydrALAZINE, morphine injection  Physical Exam Pulmonary:     Comments: High flow cannula Skin:    General: Skin is warm and dry.  Neurological:     Comments: Eyes closed.              Vital Signs: BP (!) 121/57   Pulse 89   Temp 98.2 F (36.8 C) (Oral)   Resp 14   Wt 46.1 kg   SpO2 100%   BMI 17.45 kg/m  SpO2: SpO2: 100 % O2 Device: O2 Device: High Flow Nasal Cannula O2 Flow Rate: O2 Flow Rate (L/min): 8 L/min  Intake/output summary:   Intake/Output Summary (Last 24 hours) at 2018-01-25 1415 Last data filed at 2018/01/25  0600 Gross per 24 hour  Intake 259.31 ml  Output 200 ml  Net 59.31 ml   LBM: Last BM Date: 01/14/18 Baseline Weight: Weight: 51.3 kg Most recent weight: Weight: 46.1 kg       Palliative Assessment/Data:  10%    Flowsheet Rows     Most Recent Value  Intake Tab  Referral Department  Critical care  Unit at Time of Referral  ICU  Palliative Care Primary  Diagnosis  Pulmonary  Date Notified  01/15/18  Palliative Care Type  New Palliative care  Reason for referral  Clarify Goals of Care  Date of Admission  01/30/2018  Date first seen by Palliative Care  01/16/18  # of days Palliative referral response time  1 Day(s)  # of days IP prior to Palliative referral  3  Clinical Assessment  Psychosocial & Spiritual Assessment  Palliative Care Outcomes      Patient Active Problem List   Diagnosis Date Noted  . Acute respiratory failure with hypoxia and hypercapnia (HCC) 01/07/2018  . Iron deficiency anemia 11/09/2017  . Pressure injury of skin 11/01/2017  . Protein-calorie malnutrition, severe 11/01/2017  . Acute on chronic congestive heart failure (HCC)   . Community acquired pneumonia of left lower lobe of lung (HCC)   . Cough 05/28/2017  . Leg swelling 05/28/2017  . Diarrhea 05/28/2017  . Current chronic use of systemic steroids 01/26/2017  . Bilateral leg edema 01/26/2017  . Rheumatoid arthritis (HCC) 01/20/2017  . Healthcare maintenance 07/22/2016  . Sciatic nerve pain, right 05/17/2016  . Dyspnea on exertion 02/09/2016  . Wrist arthralgia 09/02/2015  . Cigarette smoker 12/21/2013  . COPD GOLD III  10/23/2013  . Lung nodule 10/23/2013  . Hyperlipidemia 10/13/2013  . Hyponatremia 10/13/2013  . Dehydration 10/13/2013  . Essential hypertension, benign 02/15/2013  . Tobacco abuse 05/25/2012  . Chronic respiratory failure with hypoxia (HCC) 05/25/2012    Palliative Care Assessment & Plan    Recommendations/Plan:  Plans for comfort care this evening once 1 additional  family member is able to visit.     Code Status:    Code Status Orders  (From admission, onward)         Start     Ordered   01/16/18 1126  Do not attempt resuscitation (DNR)  Continuous    Question Answer Comment  In the event of cardiac or respiratory ARREST Do not call a "code blue"   In the event of cardiac or respiratory ARREST Do not perform Intubation, CPR, defibrillation or ACLS   In the event of cardiac or respiratory ARREST Use medication by any route, position, wound care, and other measures to relive pain and suffering. May use oxygen, suction and manual treatment of airway obstruction as needed for comfort.   Comments No escalation in care. Continue BIPAP. MOST form in chart.      01/16/18 1125        Code Status History    Date Active Date Inactive Code Status Order ID Comments User Context   01/25/2018 1707 01/16/2018 1125 Full Code [id number]  Norton Blizzard, NP ED   10/31/2017 2010 11/02/2017 1921 Full Code [id number]  Myrene Buddy, MD Inpatient   01/10/2017 1615 01/13/2017 1649 Full Code [id number]  Maxie Barb, MD ED   10/13/2013 1409 10/15/2013 1605 Full Code [id number]  Edsel Petrin, DO Inpatient   05/25/2012 1851 05/26/2012 1849 Full Code [id number]  Lorane Gell, MD ED       Prognosis:   Hours - Days  Discharge Planning:  Anticipated hospital death.   Care plan was discussed with RN and CCM.   Thank you for allowing the Palliative Medicine Team to assist in the care of this patient.   Time In: 1:45 Time Out: 2:30 Total Time 45 min Prolonged Time Billed  no      Greater than 50%  of this time was spent counseling and coordinating care related to the above  assessment and plan.  Asencion Gowda, NP  Please contact Palliative Medicine Team phone at [phone] for questions and concerns.

## 2018-02-03 NOTE — Death Summary Note (Signed)
  Name: Judith Scott MRN: [id number] DOB: Mar 16, 1955 62 y.o.  Date of Admission: 01/09/2018 10:28 AM Date of Discharge: 01-20-18 Attending Physician: Dr. Chesley Mires   Discharge Diagnosis: Active Problems:   Acute respiratory failure with hypoxia and hypercapnia (HCC)  Cause of death: Chronic Obstructive pulmonary disease  Time of death: 13-Mar-1807  Disposition and follow-up:   Ms.Judith Scott was discharged from [facility] in expired condition.    Hospital Course: Ms. Judith Scott was admit 1/10 with altered mental status and progressive dyspnea associated with fever, weakness and reduced PO intake. She was noted to be in acute on chronic hypercarbic respiratory failure and was started on steroids and bronchodilators for COPD exacerbation. She was placed on Bipap and despite aggressive medication management she was not able to wean from the [place] without becoming hypercarbic and obtunded. Family met with the palliative care team and decided that comfort care was within Ms. Judith Scott's wishes so she was taken off of the Bipap and changed to comfort care.   Signed: Ledell Noss, MD 01/21/2018, 1:24 PM

## 2018-02-03 NOTE — Progress Notes (Signed)
NAME:  Judith Scott, MRN:  [id number], DOB:  February 17, 1955, LOS: 5 ADMISSION DATE:  01/29/2018, CONSULTATION DATE:  1/10 REFERRING MD: Ashok Cordia - EM , CHIEF COMPLAINT:  AMS, SOB  Brief History   63 yo woman with COPD presented 1/10 with AMS and progressive dyspnea. She had associated fever, weakness and reduced PO intake.   She was noted to be in acute on chronic respiratory failure. RVP and sputum cultures were collected.    She was treated with solu medrol and bronchodilators and required noninvasive mechanical ventilation, she was a high risk for intubation so PCCM was consulted for admission.    Past Medical History  COPD, HTN, anemia, Hypercholesteremia, RA   Significant Hospital Events   1/10 > admission   Consults:  PCCM   Procedures:    Significant Diagnostic Tests:  1/10 CXR> hyperinflated lungs, no focal opacity, some interstitial coarseness   Micro Data:  1/10 Blood cultures > NGTD  1/9 sputum > moderate GPC in pairs and chains, rare GPR on gram stain but cultre consistent with normal respiratory flora  1/10 RVP neg MRSA neg  Antimicrobials:  Cefepime 1/10 Vanc 1/10 Azithromycin 1/10>> Rocephin 1/10>> RVP neg  MRSA -   Interim history/subjective:   Yesterday afternoon the PCCM and palliative care teams met with Judith Scott daughter and husband, we came to the joined conclusion that trach and peg would not be within Ms. Judith Scott wishes and have decided to transition to comfort care when her family arrives today.  Transitioned to HFNC yesterday evening and stayed on overnight    Objective   Blood pressure 132/73, pulse 91, temperature (!) 97.5 F (36.4 C), temperature source Axillary, resp. rate 13, weight 46.1 kg, SpO2 100 %.    FiO2 (%):  [35 %-50 %] 50 %   Intake/Output Summary (Last 24 hours) at 2018/01/30 4098 Last data filed at 2018/01/30 0600 Gross per 24 hour  Intake 309.31 ml  Output 400 ml  Net -90.69 ml   Filed Weights   01/14/18 0500 01/15/18  0500 01/16/18 0412  Weight: 48.8 kg 46.3 kg 46.1 kg    Examination: General: appears fatigued, chronically ill  HENT: HFNC  Lungs: Rhonchi over the left lung fields, wheezing over the right  Cardiovascular: regular rate and rhythm, no murmur  Abdomen: soft, non tender, non distended Extremities: no peripheral edema  Neuro: opens eyes to voice, does not make eye contact, does not answer questions or follow commands   Resolved Hospital Problem list     Assessment & Plan:   63 yo woman with Hx COPD presenting with acute hypercarbic respiratory failure found to be in a COPD exacerbation.   COPD exacerbation  Acute hypercarbic respiratory failure  Trial of Bipap wean yesterday was better tolerated. Yesterday she was given a dose of IV lasix, there were multiple unmeasured occurrences but it does not seem that this helped with her Bipap needs.  - IV solumedrol to 40 mg q6h - continue Brovana, pulmicort, scheduled duoneb - continue CTX and azithromycin   Hypernatremia  - hold off on treatment because IV fluids, avoiding pulmonary edema   Hypomagnesemia  - resolved   Tobacco use  - cont nicotine patch   RA  - continue to hold home immunosuppresion   HTN  - blood pressure is okay, holding home meds   Best practice:  Diet: Npo for Bipap  Pain/Anxiety/Delirium protocol (if indicated): N/A VAP protocol (if indicated): N/A DVT prophylaxis: subq Heparin  GI prophylaxis: NA Glucose  control: NA Mobility: bedrest Code Status: full Family Communication: none at bedside  Disposition:   Labs   CBC: Recent Labs  Lab 01/25/2018 1054 01/13/18 0227 01/14/18 0334 01/16/18 0910  WBC 14.8* 10.4 6.2 9.5  NEUTROABS 11.7*  --   --   --   HGB 10.5* 9.2* 8.4* 10.6*  HCT 37.0 31.9* 29.0* 38.3  MCV 99.7 100.0 97.3 101.1*  PLT 325 313 305 423*    Basic Metabolic Panel: Recent Labs  Lab 01/31/2018 1054 01/24/2018 2005 01/13/18 0227 01/14/18 0334 01/16/18 0910 01/16/18 0918  2018-02-06 0328  NA 138  --   --  142 152*  --  159*  K 3.7  --   --  3.9 4.0  --  4.6  CL 92*  --   --  97* 97*  --  102  CO2 36*  --   --  35* 42*  --  43*  GLUCOSE 95  --   --  115* 119*  --  120*  BUN 5*  --   --  21 32*  --  32*  CREATININE 0.51  --   --  0.57 0.82  --  0.52  CALCIUM 9.0  --   --  8.6* 9.1  --  9.1  MG  --  1.5*  --  2.1  --  2.0 2.1  PHOS  --  4.1 3.0 2.7  --   --   --    GFR: Estimated Creatinine Clearance: 51.7 mL/min (by C-G formula based on SCr of 0.52 mg/dL). Recent Labs  Lab 01/19/2018 1054 01/10/2018 1102 01/16/2018 2005 01/13/18 0227 01/14/18 0334 01/16/18 0910  PROCALCITON  --   --  0.14  --   --   --   WBC 14.8*  --   --  10.4 6.2 9.5  LATICACIDVEN  --  0.57  --   --   --   --     Liver Function Tests: Recent Labs  Lab 01/28/2018 1054  AST 20  ALT 15  ALKPHOS 88  BILITOT 0.5  PROT 6.8  ALBUMIN 2.8*   No results for input(s): LIPASE, AMYLASE in the last 168 hours. No results for input(s): AMMONIA in the last 168 hours.  ABG    Component Value Date/Time   PHART 7.302 (L) 01/13/2018 0355   PCO2ART 87.3 (HH) 01/13/2018 0355   PO2ART 80.0 (L) 01/13/2018 0355   HCO3 43.5 (H) 01/13/2018 0355   TCO2 46 (H) 01/13/2018 0355   O2SAT 94.0 01/13/2018 0355     Coagulation Profile: No results for input(s): INR, PROTIME in the last 168 hours.  Cardiac Enzymes: No results for input(s): CKTOTAL, CKMB, CKMBINDEX, TROPONINI in the last 168 hours.  HbA1C: No results found for: HGBA1C  CBG: Recent Labs  Lab 01/08/2018 1827 01/13/18 0013 01/13/18 0421  GLUCAP 112* 118* 128*    Review of Systems:   Denies dyspnea   Past Medical History  She,  has a past medical history of COPD (chronic obstructive pulmonary disease) ([place]), COPD exacerbation ([place]) (05/25/2012), Hypercholesteremia, and Hypertension.   Surgical History    Past Surgical History:  Procedure Laterality Date  . APPENDECTOMY       Social History   reports that she has been  smoking cigarettes. She has a 60.00 pack-year smoking history. She has never used smokeless tobacco. She reports current alcohol use. She reports that she does not use drugs.   Family History   Her family history includes Lung  cancer in her mother.   Allergies No Known Allergies   Home Medications  Prior to Admission medications   Medication Sig Start Date End Date Taking? Authorizing Provider  atorvastatin (LIPITOR) 80 MG tablet TAKE 1 TABLET (80 MG TOTAL) BY MOUTH DAILY AT 6 PM. Patient taking differently: Take 80 mg by mouth daily.  08/14/17  Yes Riccio, Levada Dy C, DO  budesonide-formoterol (SYMBICORT) 160-4.5 MCG/ACT inhaler Inhale 2 puffs into the lungs 2 (two) times daily. 12/04/17  Yes Riccio, Angela C, DO  leflunomide (ARAVA) 20 MG tablet Take 20 mg by mouth daily. 02/01/16  Yes [provider]  losartan (COZAAR) 25 MG tablet Take 50 mg by mouth daily. 09/22/17  Yes [provider]  tiotropium (SPIRIVA HANDIHALER) 18 MCG inhalation capsule Place 1 capsule (18 mcg total) into inhaler and inhale daily. 04/25/17  Yes Riccio, Angela C, DO  albuterol (PROVENTIL HFA;VENTOLIN HFA) 108 (90 Base) MCG/ACT inhaler Inhale 2 puffs into the lungs every 4 (four) hours as needed for wheezing or shortness of breath. Patient not taking: Reported on 01/30/2018 03/06/17   Lucila Maine C, DO  albuterol (PROVENTIL) (2.5 MG/3ML) 0.083% nebulizer solution Take 3 mLs (2.5 mg total) by nebulization every 6 (six) hours as needed for wheezing or shortness of breath. Patient not taking: Reported on 01/14/2018 05/25/17   Rogue Bussing, MD  ferrous sulfate 325 (65 FE) MG tablet TAKE 1 TABLET BY MOUTH EVERY OTHER DAY Patient not taking: Reported on 01/25/2018 12/25/17   Marjie Skiff, MD  furosemide (LASIX) 20 MG tablet Take 1 tablet (20 mg total) by mouth daily as needed for edema. Patient not taking: Reported on 10/31/2017 01/19/17   Mercy Riding, MD  lactose free nutrition (BOOST PLUS)  LIQD Take 237 mLs by mouth 3 (three) times daily with meals. 11/02/17   Guadalupe Dawn, MD  nicotine (NICODERM CQ - DOSED IN MG/24 HOURS) 14 mg/24hr patch Place 1 patch (14 mg total) onto the skin daily. Patient not taking: Reported on 02/02/2018 11/09/17   Matilde Haymaker, MD  nicotine (NICODERM CQ - DOSED IN MG/24 HOURS) 21 mg/24hr patch Place 1 patch (21 mg total) onto the skin daily. Patient not taking: Reported on 01/31/2018 11/03/17   Guadalupe Dawn, MD  nicotine (NICODERM CQ - DOSED IN MG/24 HR) 7 mg/24hr patch Place 1 patch (7 mg total) onto the skin daily. Patient not taking: Reported on 01/27/2018 11/09/17   Matilde Haymaker, MD  OXYGEN 3lpm with sleep and as needed during the day    [provider]     Critical care time: **

## 2018-02-03 NOTE — Progress Notes (Signed)
eLink Physician-Brief Progress Note Patient Name: Marcedes Schemmel DOB: 11/29/1955 MRN: [id number]   Date of Service  01/25/2018  HPI/Events of Note  Brief run of SVT with spontaneous termination. Last electrolyte check almost 24 hours ago.  eICU Interventions  I ordered BMP and Mg 2+ for this AM.        Thomasene Lot Ogan 01/10/2018, 2:24 AM

## 2018-02-03 NOTE — Progress Notes (Signed)
Patient resting comfortably on 8L [place] high flow nasal cannula with no distress noted.  Will hold on bipap use.  Will continue to monitor.

## 2018-02-03 DEATH — deceased

## 2020-01-14 IMAGING — DX DG CHEST 1V PORT
1 series · 2 of 2 positions shown · non-contrast
Comparison: Chest x-ray dated January 12, 2018.

CLINICAL DATA: COPD.

EXAM:
PORTABLE CHEST 1 VIEW

[Series 1: chest · 0.14mm/px · 2 of 2 slices shown]
[im 1/2]
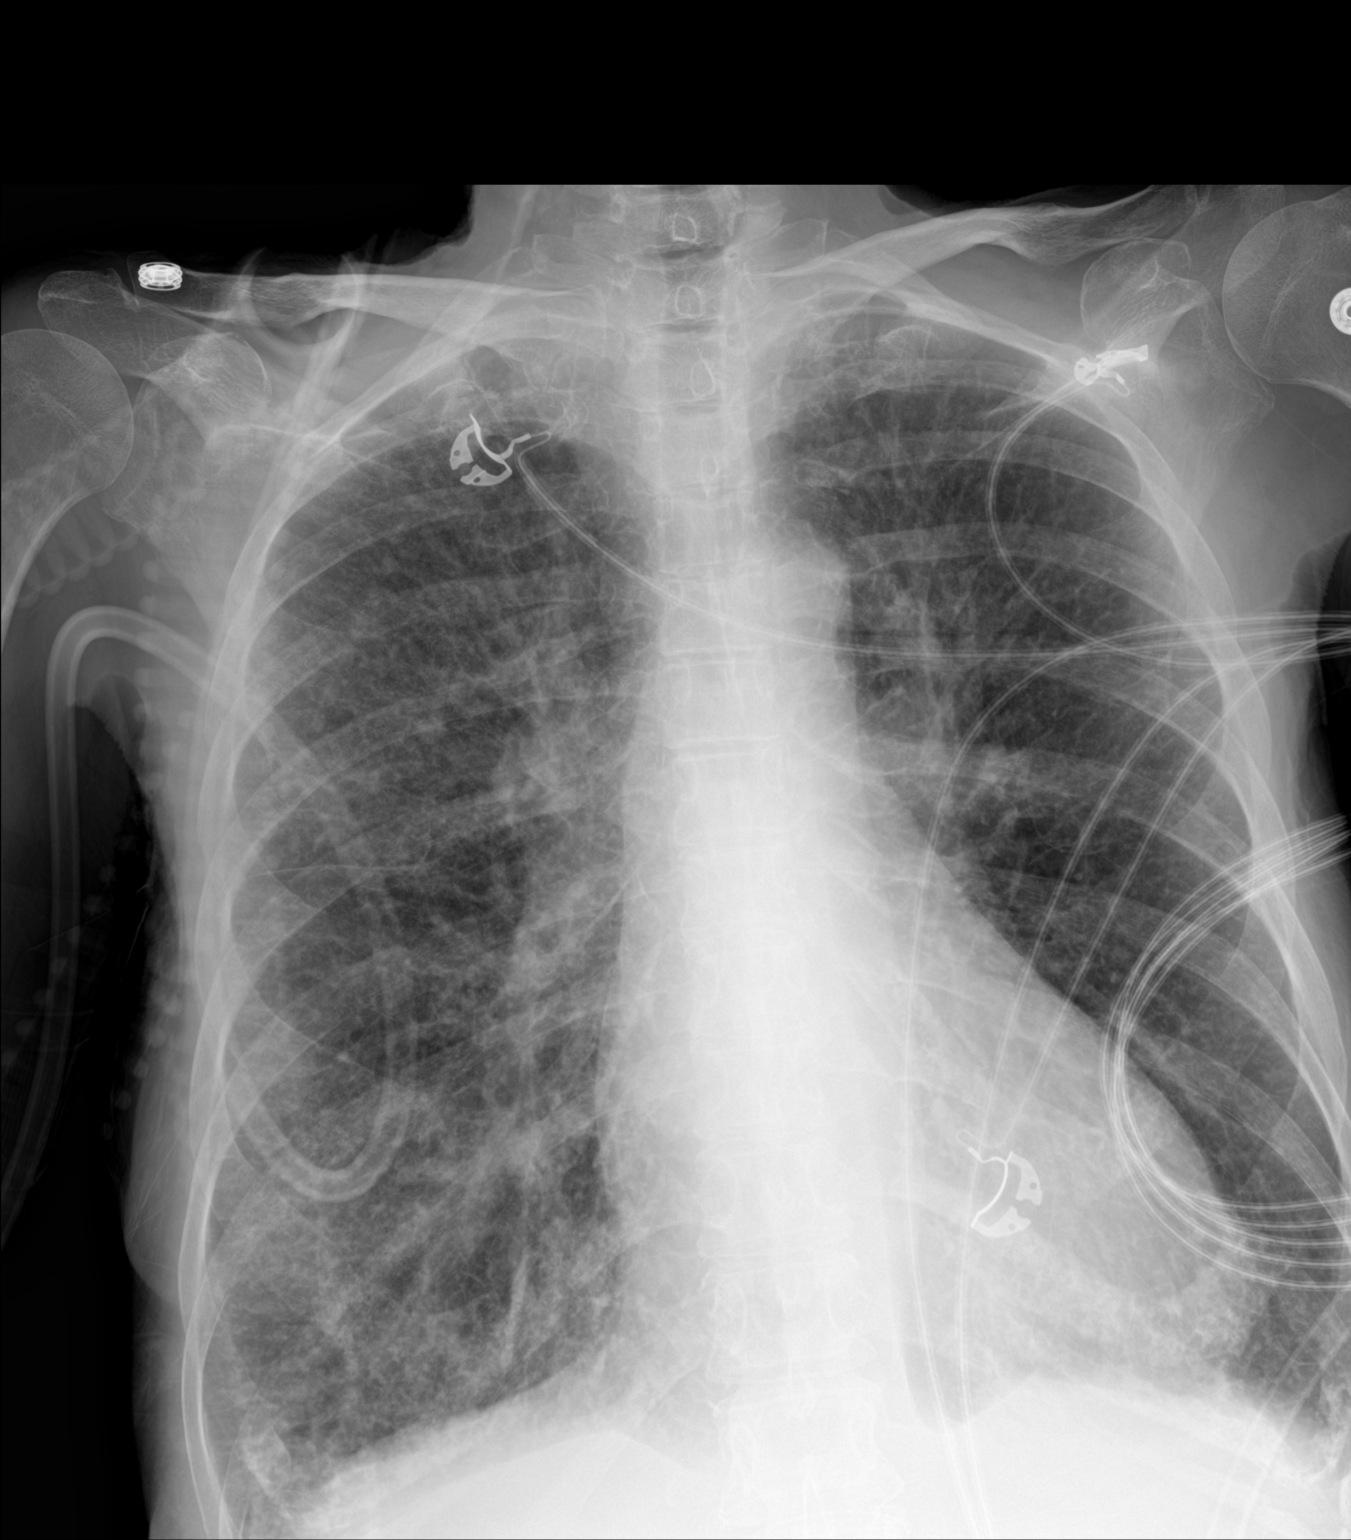
[im 2/2]
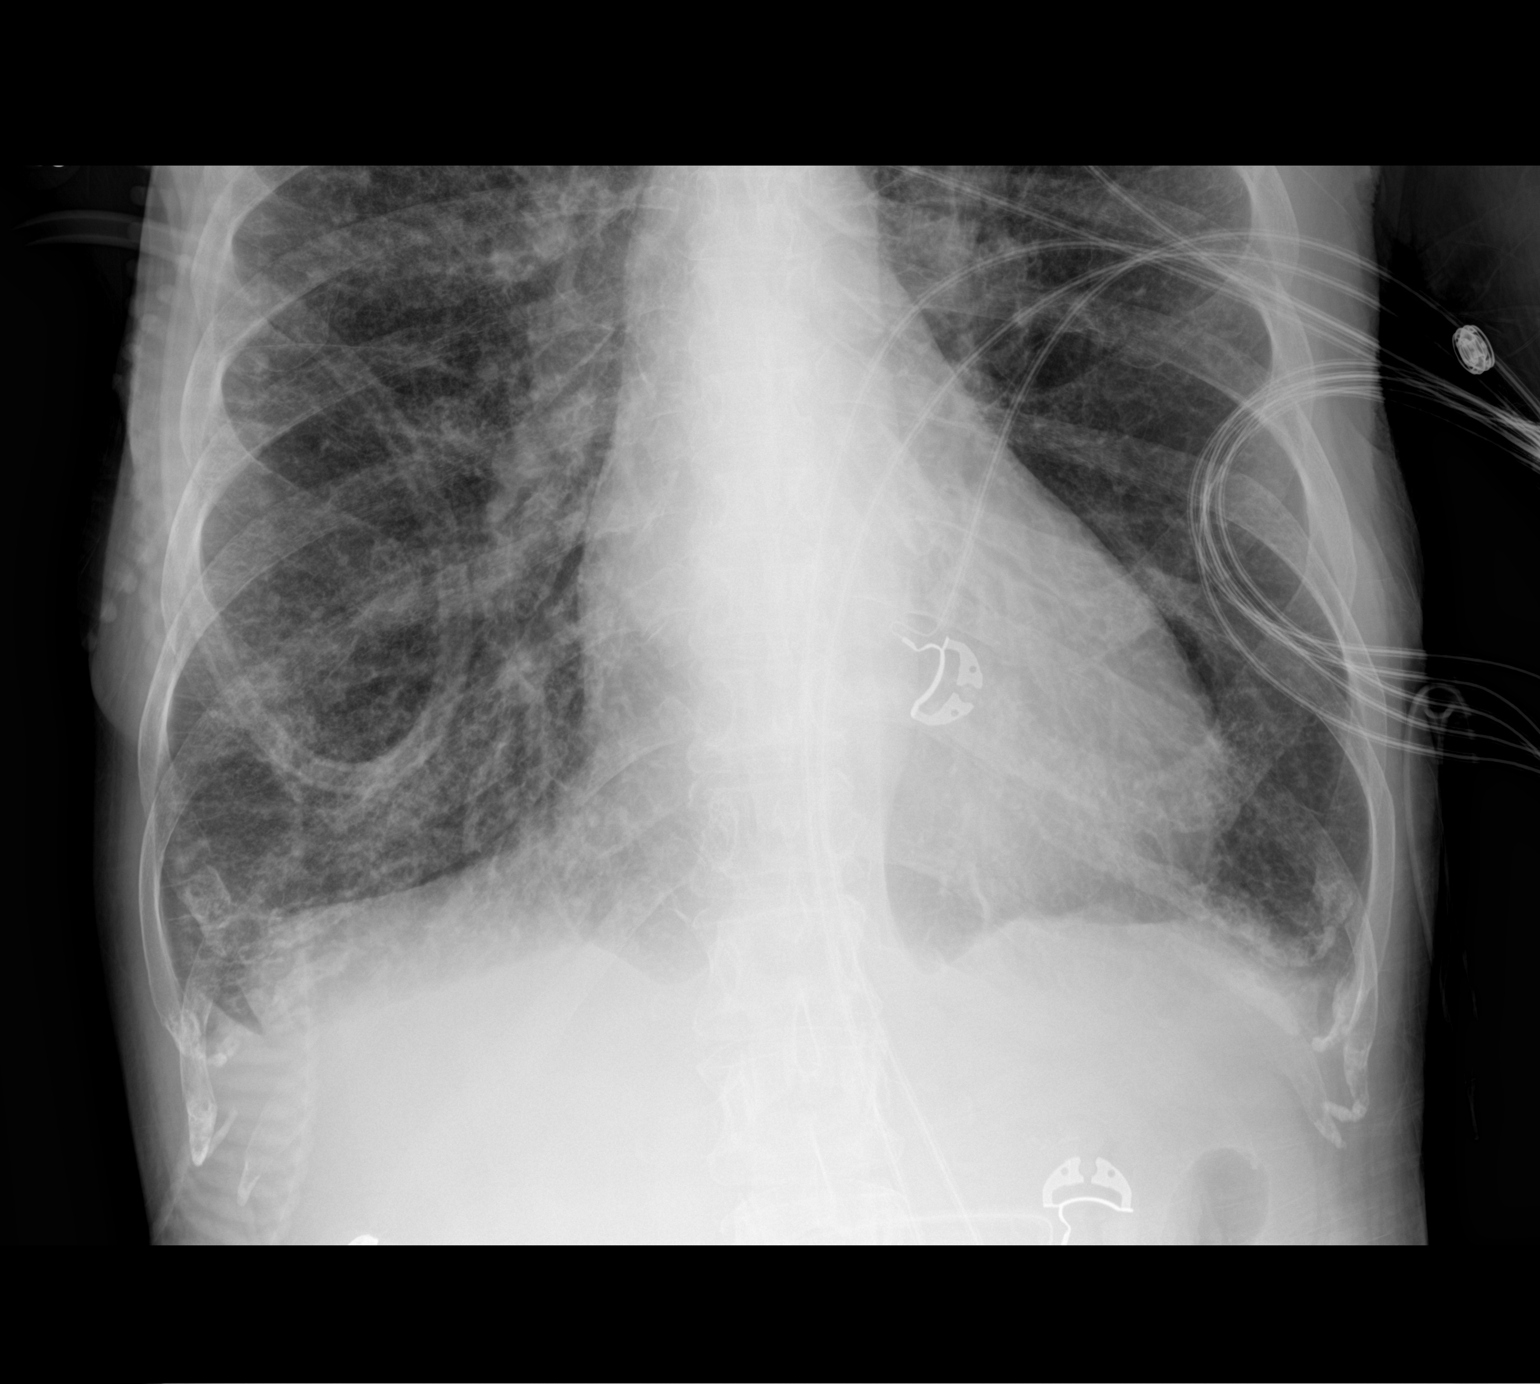

[2 of 2 positions shown; findings below may reference images not displayed]

FINDINGS: The heart size and mediastinal contours are within normal limits.
There is worsening interstitial thickening in the right lung,
particularly at the right lung base, superimposed on chronically
coarsened interstitial markings. The lungs remain hyperinflated. No
focal consolidation, pleural effusion, or pneumothorax. No acute
osseous abnormality.
IMPRESSION: 1. Worsening right-sided pulmonary interstitial edema.
2. COPD.
